# Patient Record
Sex: Male | Born: 1970 | Race: Black or African American | Hispanic: No | Marital: Married | Smoking: Never smoker
Health system: Southern US, Community
[De-identification: ages and names within clinical notes are randomized; demographics above are authoritative.]

## PROBLEM LIST (undated history)

## (undated) DIAGNOSIS — I10 Essential (primary) hypertension: Secondary | ICD-10-CM

## (undated) DIAGNOSIS — K31A0 Gastric intestinal metaplasia, unspecified: Secondary | ICD-10-CM

## (undated) DIAGNOSIS — E785 Hyperlipidemia, unspecified: Secondary | ICD-10-CM

## (undated) DIAGNOSIS — K2970 Gastritis, unspecified, without bleeding: Secondary | ICD-10-CM

## (undated) DIAGNOSIS — K449 Diaphragmatic hernia without obstruction or gangrene: Secondary | ICD-10-CM

## (undated) DIAGNOSIS — T7840XA Allergy, unspecified, initial encounter: Secondary | ICD-10-CM

## (undated) DIAGNOSIS — E538 Deficiency of other specified B group vitamins: Secondary | ICD-10-CM

## (undated) DIAGNOSIS — K219 Gastro-esophageal reflux disease without esophagitis: Secondary | ICD-10-CM

## (undated) DIAGNOSIS — E119 Type 2 diabetes mellitus without complications: Secondary | ICD-10-CM

## (undated) DIAGNOSIS — D126 Benign neoplasm of colon, unspecified: Secondary | ICD-10-CM

## (undated) DIAGNOSIS — K3189 Other diseases of stomach and duodenum: Secondary | ICD-10-CM

## (undated) DIAGNOSIS — R000 Tachycardia, unspecified: Secondary | ICD-10-CM

## (undated) HISTORY — DX: Type 2 diabetes mellitus without complications: E11.9

## (undated) HISTORY — DX: Deficiency of other specified B group vitamins: E53.8

## (undated) HISTORY — DX: Allergy, unspecified, initial encounter: T78.40XA

## (undated) HISTORY — DX: Gastritis, unspecified, without bleeding: K29.70

## (undated) HISTORY — PX: UPPER GASTROINTESTINAL ENDOSCOPY: SHX188

## (undated) HISTORY — DX: Essential (primary) hypertension: I10

## (undated) HISTORY — DX: Hyperlipidemia, unspecified: E78.5

## (undated) HISTORY — DX: Gastro-esophageal reflux disease without esophagitis: K21.9

## (undated) HISTORY — DX: Tachycardia, unspecified: R00.0

## (undated) HISTORY — DX: Diaphragmatic hernia without obstruction or gangrene: K44.9

## (undated) HISTORY — DX: Gastric intestinal metaplasia, unspecified: K31.A0

## (undated) HISTORY — PX: COLONOSCOPY: SHX174

## (undated) HISTORY — DX: Benign neoplasm of colon, unspecified: D12.6

## (undated) HISTORY — PX: LEG SURGERY: SHX1003

---
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
## 2011-05-19 ENCOUNTER — Other Ambulatory Visit: Payer: Self-pay | Admitting: Family Medicine

## 2011-05-19 DIAGNOSIS — R1011 Right upper quadrant pain: Secondary | ICD-10-CM

## 2011-05-22 ENCOUNTER — Ambulatory Visit
Admission: RE | Admit: 2011-05-22 | Discharge: 2011-05-22 | Disposition: A | Discharge status: Home / Self Care | Payer: BC Managed Care – PPO | Source: Ambulatory Visit | Attending: Family Medicine | Admitting: Family Medicine

## 2011-05-22 ENCOUNTER — Other Ambulatory Visit: Payer: Self-pay | Admitting: Family Medicine

## 2011-05-22 DIAGNOSIS — R1011 Right upper quadrant pain: Secondary | ICD-10-CM

## 2011-06-06 ENCOUNTER — Encounter: Payer: Self-pay | Admitting: Gastroenterology

## 2011-06-19 ENCOUNTER — Encounter: Payer: Self-pay | Admitting: *Deleted

## 2011-06-20 ENCOUNTER — Encounter: Payer: Self-pay | Admitting: Gastroenterology

## 2011-06-20 ENCOUNTER — Ambulatory Visit (INDEPENDENT_AMBULATORY_CARE_PROVIDER_SITE_OTHER): Payer: BC Managed Care – PPO | Admitting: Gastroenterology

## 2011-06-20 VITALS — BP 124/74 | Ht 67.0 in | Wt 190.6 lb

## 2011-06-20 DIAGNOSIS — K219 Gastro-esophageal reflux disease without esophagitis: Secondary | ICD-10-CM

## 2011-06-20 DIAGNOSIS — E739 Lactose intolerance, unspecified: Secondary | ICD-10-CM

## 2011-06-20 MED ORDER — OMEPRAZOLE 40 MG PO CPDR: 40.0000 mg | DELAYED_RELEASE_CAPSULE | Freq: Every day | ORAL | Status: DC

## 2011-06-20 NOTE — Progress Notes (Signed)
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 I saw him approximately 10 years ago for evaluation of acid reflux him up and at that time he had a negative endoscopy. He has done well and has been asymptomatic until 2 months ago when he developed recurrent reflux symptoms, gas, bloating, no real abnormal pain. Upper abdominal ultrasound exam was unremarkable. He specifically denies dysphagia, nausea vomiting, melena, hematochezia, or other upper GI or hepatobiliary complaints. His appetite is good he has gained approximately 25 pounds in weight over the last few years. He does have some stress at work which may be contributing to his problems. He does not use alcohol, cigarettes, or NSAIDs. Family history is noncontributory. It is of note that he does use a large amount of nonabsorbable carbohydrates with chewing gum. He denies lactose intolerance, but does not use much of this type of food.  I have reviewed this patient's present history, medical and surgical past history, allergies and medications.     ROS: The remainder of the 10 point ROS is negative      Physical Exam: General well developed well nourished patient in no acute distress, appearinghis stated age Eyes PERRLA, no icterus, fundoscopic exam per opthamologist Skin no lesions noted Neck supple, no adenopathy, no thyroid enlargement, no tenderness Chest clear to percussion and auscultation Heart no significant murmurs, gallops or rubs noted Abdomen no hepatosplenomegaly masses or tenderness, BS normal. Extremities no acute joint lesions, edema, phlebitis or evidence of cellulitis. Neurologic patient oriented x 3, cranial nerves intact, no focal neurologic deficits noted. Psychological mental status normal and normal affect.  Assessment and plan: GERD with associated probable mild maldigestion of nonabsorbable carbohydrates. We have gone over a standard reflux regime, started  Dexilant 60 mg a day, avoidance of sorbitol and fructose when necessary Lactaid use. Patient reports a recent laboratory data was unremarkable. I will see him back in one month's time for followup. I do not think endoscopy is indicated at this time unless he continues to have difficulties.  Encounter Diagnoses  Name Primary?  . GERD (gastroesophageal reflux disease) Yes  . Disaccharide malabsorption

## 2011-06-20 NOTE — Patient Instructions (Addendum)
We have sent the following medications to your pharmacy for you to pick up at your convenience: Omeprazole 40 mg daily. Please pick up lactaid over the counter and take as directed. Please follow the artificial sweeteners sheet we have given you. Please follow up in 1 month with Dr Jarold Motto  Diet for GERD  Nutrition therapy can help ease the discomfort of gastroesophageal reflux disease (GERD) and peptic ulcer disease (PUD).  HOME CARE INSTRUCTIONS   Eat your meals slowly, in a relaxed setting.   Eat 5 to 6 small meals per day.   If a food causes distress, stop eating it for a period of time.  FOODS TO AVOID  Coffee, regular or decaffeinated.   Cola beverages, regular or low calorie.   Tea, regular or decaffeinated.   Pepper.   Cocoa.   High fat foods, including meats.   Butter, margarine, hydrogenated oil (trans fats).   Peppermint or spearmint (if you have GERD).   Fruits and vegetables if not tolerated.   Alcohol.   Nicotine (smoking or chewing). This is one of the most potent stimulants to acid production in the gastrointestinal tract.   Any food that seems to aggravate your condition.  If you have questions regarding your diet, ask your caregiver or a registered dietitian. TIPS  Lying flat may make symptoms worse. Keep the head of your bed raised 6 to 9 inches (15 to 23 cm) by using a foam wedge or blocks under the legs of the bed.   Do not lay down until 3 hours after eating a meal.   Daily physical activity may help reduce symptoms.  MAKE SURE YOU:   Understand these instructions.   Will watch your condition.   Will get help right away if you are not doing well or get worse.  Document Released: 06/26/2005 Document Revised: 03/08/2011 Document Reviewed: 11/09/2008 [facility] Patient Information 2012 [place], [place].

## 2011-06-22 ENCOUNTER — Other Ambulatory Visit: Payer: Self-pay | Admitting: *Deleted

## 2011-06-22 MED ORDER — FIRST-DUKES MOUTHWASH MT SUSP: OROMUCOSAL | Status: DC

## 2011-07-10 ENCOUNTER — Ambulatory Visit (INDEPENDENT_AMBULATORY_CARE_PROVIDER_SITE_OTHER): Payer: BC Managed Care – PPO

## 2011-07-10 DIAGNOSIS — M545 Low back pain: Secondary | ICD-10-CM

## 2011-07-10 DIAGNOSIS — R1011 Right upper quadrant pain: Secondary | ICD-10-CM

## 2011-07-10 DIAGNOSIS — R51 Headache: Secondary | ICD-10-CM

## 2011-08-20 ENCOUNTER — Ambulatory Visit (INDEPENDENT_AMBULATORY_CARE_PROVIDER_SITE_OTHER): Payer: BC Managed Care – PPO | Admitting: Emergency Medicine

## 2011-08-20 VITALS — BP 136/86 | HR 91 | Temp 98.8°F | Resp 16 | Ht 67.0 in | Wt 186.0 lb

## 2011-08-20 DIAGNOSIS — J029 Acute pharyngitis, unspecified: Secondary | ICD-10-CM

## 2011-08-20 DIAGNOSIS — G43909 Migraine, unspecified, not intractable, without status migrainosus: Secondary | ICD-10-CM | POA: Insufficient documentation

## 2011-08-20 MED ORDER — MAGIC MOUTHWASH W/LIDOCAINE: 5.0000 mL | Freq: Four times a day (QID) | ORAL | Status: DC

## 2011-08-20 NOTE — Patient Instructions (Signed)

## 2011-08-20 NOTE — Progress Notes (Signed)
  Subjective:    Patient ID: Gregory Collins, male    DOB: 26-Dec-1970, 41 y.o.   MRN: [id number]  HPI the patient enters with a chief complaint of sore throat. For the last 2 days been bother with a severe sore throat not associated with fevers chills or adenopathy. He's had very minimal nasal congestion and has also had a cough.    Review of Systems patient is under treatment for reflux. 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     Objective:   Physical Exam physical exam reveals the TMs to be normal. The nose is normal. His throat is slightly injected. There is no adenopathy noted. Lungs are clear to auscultation and percussion.        Assessment & Plan:  Assessment pharyngitis. We'll check a strep test be sure this is not strep pharyngitis.

## 2011-08-21 ENCOUNTER — Encounter: Payer: Self-pay | Admitting: Gastroenterology

## 2011-08-21 ENCOUNTER — Encounter: Payer: Self-pay | Admitting: Emergency Medicine

## 2011-09-30 ENCOUNTER — Other Ambulatory Visit: Payer: Self-pay | Admitting: *Deleted

## 2011-09-30 MED ORDER — ALPRAZOLAM 1 MG PO TABS: 1.0000 mg | ORAL_TABLET | Freq: Four times a day (QID) | ORAL | Status: AC | PRN

## 2011-10-17 ENCOUNTER — Other Ambulatory Visit: Payer: Self-pay | Admitting: Gastroenterology

## 2011-12-27 ENCOUNTER — Ambulatory Visit (INDEPENDENT_AMBULATORY_CARE_PROVIDER_SITE_OTHER): Payer: BC Managed Care – PPO | Admitting: Family Medicine

## 2011-12-27 VITALS — BP 122/82 | HR 89 | Temp 98.6°F | Resp 18 | Ht 66.5 in | Wt 185.0 lb

## 2011-12-27 DIAGNOSIS — R197 Diarrhea, unspecified: Secondary | ICD-10-CM

## 2011-12-27 DIAGNOSIS — A088 Other specified intestinal infections: Secondary | ICD-10-CM

## 2011-12-27 DIAGNOSIS — A084 Viral intestinal infection, unspecified: Secondary | ICD-10-CM

## 2011-12-27 LAB — POCT CBC
Granulocyte percent: 59.7 %G (ref 37–80)
HCT, POC: 48.9 % (ref 43.5–53.7)
Lymph, poc: 2.3 (ref 0.6–3.4)
MCHC: 33.1 g/dL (ref 31.8–35.4)
MCV: 90.6 fL (ref 80–97)
MID (cbc): 0.6 (ref 0–0.9)
POC LYMPH PERCENT: 31.4 %L (ref 10–50)
Platelet Count, POC: 325 10*3/uL (ref 142–424)
RDW, POC: 14.8 %

## 2011-12-27 MED ORDER — DICYCLOMINE HCL 10 MG PO CAPS: ORAL_CAPSULE | ORAL | Status: DC

## 2011-12-27 NOTE — Progress Notes (Signed)
Results for orders placed in visit on 12/27/11  POCT CBC      Component Value Range   WBC 7.2  4.6 - 10.2 K/uL   Lymph, poc 2.3  0.6 - 3.4   POC LYMPH PERCENT 31.4  10 - 50 %L   MID (cbc) 0.6  0 - 0.9   POC MID % 8.9  0 - 12 %M   POC Granulocyte 4.3  2 - 6.9   Granulocyte percent 59.7  37 - 80 %G   RBC 5.40  4.69 - 6.13 M/uL   Hemoglobin 16.2  14.1 - 18.1 g/dL   HCT, POC 16.1  09.6 - 53.7 %   MCV 90.6  80 - 97 fL   MCH, POC 30.3  27 - 31.2 pg   MCHC 33.1  31.8 - 35.4 g/dL   RDW, POC 04.5     Platelet Count, POC 325  142 - 424 K/uL   MPV 9.5  0 - 99.8 fL

## 2011-12-27 NOTE — Patient Instructions (Signed)
Viral Gastroenteritis Viral gastroenteritis is also known as stomach flu. This condition affects the stomach and intestinal tract. It can cause sudden diarrhea and vomiting. The illness typically lasts 3 to 8 days. Most people develop an immune response that eventually gets rid of the virus. While this natural response develops, the virus can make you quite ill. CAUSES  Many different viruses can cause gastroenteritis, such as rotavirus or noroviruses. You can catch one of these viruses by consuming contaminated food or water. You may also catch a virus by sharing utensils or other personal items with an infected person or by touching a contaminated surface. SYMPTOMS  The most common symptoms are diarrhea and vomiting. These problems can cause a severe loss of body fluids (dehydration) and a body salt (electrolyte) imbalance. Other symptoms may include:  Fever.   Headache.   Fatigue.   Abdominal pain.  DIAGNOSIS  Your caregiver can usually diagnose viral gastroenteritis based on your symptoms and a physical exam. A stool sample may also be taken to test for the presence of viruses or other infections. TREATMENT  This illness typically goes away on its own. Treatments are aimed at rehydration. The most serious cases of viral gastroenteritis involve vomiting so severely that you are not able to keep fluids down. In these cases, fluids must be given through an intravenous line (IV). HOME CARE INSTRUCTIONS   Drink enough fluids to keep your urine clear or pale yellow. Drink small amounts of fluids frequently and increase the amounts as tolerated.   Ask your caregiver for specific rehydration instructions.   Avoid:   Foods high in sugar.   Alcohol.   Carbonated drinks.   Tobacco.   Juice.   Caffeine drinks.   Extremely hot or cold fluids.   Fatty, greasy foods.   Too much intake of anything at one time.   Dairy products until 24 to 48 hours after diarrhea stops.   You may  consume probiotics. Probiotics are active cultures of beneficial bacteria. They may lessen the amount and number of diarrheal stools in adults. Probiotics can be found in yogurt with active cultures and in supplements.   Wash your hands well to avoid spreading the virus.   Only take over-the-counter or prescription medicines for pain, discomfort, or fever as directed by your caregiver. Do not give aspirin to children. Antidiarrheal medicines are not recommended.   Ask your caregiver if you should continue to take your regular prescribed and over-the-counter medicines.   Keep all follow-up appointments as directed by your caregiver.  SEEK IMMEDIATE MEDICAL CARE IF:   You are unable to keep fluids down.   You do not urinate at least once every 6 to 8 hours.   You develop shortness of breath.   You notice blood in your stool or vomit. This may look like coffee grounds.   You have abdominal pain that increases or is concentrated in one small area (localized).   You have persistent vomiting or diarrhea.   You have a fever.   The patient is a child younger than 3 months, and he or she has a fever.   The patient is a child older than 3 months, and he or she has a fever and persistent symptoms.   The patient is a child older than 3 months, and he or she has a fever and symptoms suddenly get worse.   The patient is a baby, and he or she has no tears when crying.  MAKE SURE YOU:     Understand these instructions.   Will watch your condition.   Will get help right away if you are not doing well or get worse.  Document Released: 06/26/2005 Document Revised: 06/15/2011 Document Reviewed: 04/12/2011 [employer] Patient Information 2012 [employer]. 

## 2012-04-27 ENCOUNTER — Ambulatory Visit (INDEPENDENT_AMBULATORY_CARE_PROVIDER_SITE_OTHER): Payer: BC Managed Care – PPO | Admitting: Family Medicine

## 2012-04-27 VITALS — BP 112/84 | HR 100 | Temp 97.5°F | Resp 16 | Ht 68.0 in | Wt 193.0 lb

## 2012-04-27 DIAGNOSIS — J029 Acute pharyngitis, unspecified: Secondary | ICD-10-CM

## 2012-04-27 MED ORDER — AMOXICILLIN 875 MG PO TABS: 875.0000 mg | ORAL_TABLET | Freq: Two times a day (BID) | ORAL | Status: DC

## 2012-04-27 NOTE — Patient Instructions (Signed)

## 2012-04-27 NOTE — Progress Notes (Signed)
@[email or web address] @   Patient ID: ANGLE KAREL MRN: [id number], DOB: 1971/02/02, 41 y.o. Date of Encounter: 04/27/2012, 2:26 PM  Primary Physician: Tonye Pearson, MD  Chief Complaint:  Chief Complaint  Patient presents with  . Sore Throat    * 2 days  . Cough    HPI: 41 y.o. year old male presents with 3 day history of sore throat. Subjective fever and chills. No cough, congestion, rhinorrhea, sinus pressure, otalgia, or headache. Normal hearing. No GI complaints. Able to swallow saliva, but hurts to do so. Decreased appetite secondary to sore throat.   Past Medical History  Diagnosis Date  . Asthma   . GERD (gastroesophageal reflux disease)      Home Meds: Prior to Admission medications   Medication Sig Start Date End Date Taking? Authorizing Provider  omeprazole (PRILOSEC) 40 MG capsule Take 1 capsule (40 mg total) by mouth daily. 06/20/11 06/19/12 Yes Mardella Layman, MD  Alum & Mag Hydroxide-Simeth (MAGIC MOUTHWASH W/LIDOCAINE) SOLN Take 5 mLs by mouth 4 (four) times daily. 08/20/11   Collene Gobble, MD  dicyclomine (BENTYL) 10 MG capsule ONe or Two pills 4 times daily for diarrhea and cramping 12/27/11   Peyton Najjar, MD  Diphenhyd-Hydrocort-Nystatin (FIRST-DUKES MOUTHWASH) SUSP Use prn 06/22/11   Mardella Layman, MD  omeprazole (PRILOSEC) 10 MG capsule Take 10 mg by mouth as needed.    Historical Provider, MD    Allergies: No Known Allergies  History   Social History  . Marital Status: Married    Spouse Name: N/A    Number of Children: N/A  . Years of Education: N/A   Occupational History  . Not on file.   Social History Main Topics  . Smoking status: Never Smoker   . Smokeless tobacco: Not on file  . Alcohol Use: No  . Drug Use: No  . Sexually Active: Not on file   Other Topics Concern  . Not on file   Social History Narrative   ** Merged History Encounter **      Review of Systems: Constitutional: negative for chills, fever, night sweats or  weight changes HEENT: see above Cardiovascular: negative for chest pain or palpitations Respiratory: negative for hemoptysis, wheezing, or shortness of breath Abdominal: negative for abdominal pain, nausea, vomiting or diarrhea Dermatological: negative for rash Neurologic: negative for headache   Physical Exam: Blood pressure 112/84, pulse 100, temperature 97.5 F (36.4 C), temperature source Oral, resp. rate 16, height 5\' 8"  (1.727 m), weight 193 lb (87.544 kg), SpO2 97.00%., Body mass index is 29.35 kg/(m^2). General: Well developed, well nourished, in no acute distress. Head: Normocephalic, atraumatic, eyes without discharge, sclera non-icteric, nares are patent. Bilateral auditory canals clear, TM's are without perforation, pearly grey with reflective cone of light bilaterally. No sinus TTP. Oral cavity moist, dentition normal. Posterior pharynx with post nasal drip and mild erythema. No peritonsillar abscess or tonsillar exudate. Neck: Supple. No thyromegaly. Full ROM. No lymphadenopathy. Lungs: Clear bilaterally to auscultation without wheezes, rales, or rhonchi. Breathing is unlabored. Heart: RRR with S1 S2. No murmurs, rubs, or gallops appreciated. Abdomen: Soft, non-tender, non-distended with normoactive bowel sounds. No hepatomegaly. No rebound/guarding. No obvious abdominal masses. Msk:  Strength and tone normal for age. Extremities: No clubbing or cyanosis. No edema. Neuro: Alert and oriented X 3. Moves all extremities spontaneously. CNII-XII grossly in tact. Psych:  Responds to questions appropriately with a normal affect.   Labs:   ASSESSMENT AND PLAN:  41 y.o.  year old male with  - -Tylenol/Motrin prn -Rest/fluids -RTC precautions -RTC 3-5 days if no improvement  Signed, Elvina Sidle, MD 04/27/2012 2:26 PM

## 2012-04-29 LAB — CULTURE, GROUP A STREP: Organism ID, Bacteria: NORMAL

## 2012-06-24 ENCOUNTER — Ambulatory Visit (INDEPENDENT_AMBULATORY_CARE_PROVIDER_SITE_OTHER): Payer: BC Managed Care – PPO | Admitting: Internal Medicine

## 2012-06-24 VITALS — BP 134/86 | HR 91 | Temp 97.0°F | Resp 18 | Ht 67.0 in | Wt 198.2 lb

## 2012-06-24 DIAGNOSIS — M25539 Pain in unspecified wrist: Secondary | ICD-10-CM

## 2012-06-24 MED ORDER — MELOXICAM 15 MG PO TABS: 15.0000 mg | ORAL_TABLET | Freq: Every day | ORAL | Status: DC

## 2012-06-25 NOTE — Progress Notes (Signed)
  Subjective:    Patient ID: Gregory Collins, male    DOB: 1971-06-11, 41 y.o.   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 He has had no numbness or tingling in the hand, and no weakness There is no prior wrist or hand injury    Review of Systems     Objective:   Physical Exam Vital signs stable except overweight The left wrist is nonswollen There is tenderness to palpation over the volar aspect of the wrist with a positive Tinel's sign Wrist has a full range of motion without pain No sensory losses in the hand Cannot reproduce paresthesias with dorsiflexion of wrist DTreflexes are preserved No vascular losses        Assessment & Plan: Problem #1 Problem #1 wrist pain problem #1 wrist pain-   P#1 wrist pain-suspect early carpal tunnel syndrome  Wrist splint for work and sleep for 2 weeks Handout for exercises Meloxicam daily 15 mg Recheck 2-4 weeks

## 2012-08-11 ENCOUNTER — Encounter: Payer: Self-pay | Admitting: Family Medicine

## 2012-08-11 ENCOUNTER — Ambulatory Visit (INDEPENDENT_AMBULATORY_CARE_PROVIDER_SITE_OTHER): Payer: BC Managed Care – PPO | Admitting: Family Medicine

## 2012-08-11 VITALS — BP 142/84 | HR 93 | Temp 97.8°F | Resp 16 | Ht 68.0 in | Wt 186.6 lb

## 2012-08-11 DIAGNOSIS — T24109A Burn of first degree of unspecified site of unspecified lower limb, except ankle and foot, initial encounter: Secondary | ICD-10-CM

## 2012-08-11 DIAGNOSIS — M79605 Pain in left leg: Secondary | ICD-10-CM

## 2012-08-11 DIAGNOSIS — M79609 Pain in unspecified limb: Secondary | ICD-10-CM

## 2012-08-11 DIAGNOSIS — T22119A Burn of first degree of unspecified forearm, initial encounter: Secondary | ICD-10-CM

## 2012-08-11 MED ORDER — TRIAMCINOLONE ACETONIDE 0.1 % EX CREA: TOPICAL_CREAM | Freq: Three times a day (TID) | CUTANEOUS | Status: DC

## 2012-08-11 MED ORDER — TRAMADOL HCL 50 MG PO TABS: 50.0000 mg | ORAL_TABLET | Freq: Four times a day (QID) | ORAL | Status: DC | PRN
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
## 2012-08-14 ENCOUNTER — Encounter: Payer: BC Managed Care – PPO | Admitting: Internal Medicine

## 2012-09-04 ENCOUNTER — Encounter: Payer: BC Managed Care – PPO | Admitting: Internal Medicine

## 2012-11-07 ENCOUNTER — Ambulatory Visit (INDEPENDENT_AMBULATORY_CARE_PROVIDER_SITE_OTHER): Payer: BC Managed Care – PPO | Admitting: Physician Assistant

## 2012-11-07 VITALS — BP 132/80 | HR 91 | Temp 98.0°F | Resp 16 | Ht 68.0 in | Wt 184.0 lb

## 2012-11-07 DIAGNOSIS — J309 Allergic rhinitis, unspecified: Secondary | ICD-10-CM

## 2012-11-07 DIAGNOSIS — J029 Acute pharyngitis, unspecified: Secondary | ICD-10-CM

## 2012-11-07 DIAGNOSIS — J302 Other seasonal allergic rhinitis: Secondary | ICD-10-CM

## 2012-11-07 LAB — POCT RAPID STREP A (OFFICE): Rapid Strep A Screen: NEGATIVE

## 2012-11-07 NOTE — Progress Notes (Signed)
   [address], [place] [address]   Phone [phone]  Subjective:    Patient ID: Gregory Collins, male    DOB: 01/10/1971, 42 y.o.   MRN: [id number]  HPI Pt presents to clinic with sore throat.  He has seasonal allergies but typically does not get sore throat.  He is concerned mainly because his children bother tested + for strep at the peds office last week.  He has had no other symptoms that are not related to his allergies.  No OTC meds. Both children are being treated for strep throat.  Review of Systems  Constitutional: Negative for fever and chills.  HENT: Positive for congestion, sore throat, rhinorrhea and postnasal drip.   Respiratory: Positive for cough.   Allergic/Immunologic: Positive for environmental allergies.       Objective:   Physical Exam  Vitals reviewed. Constitutional: He is oriented to person, place, and time. He appears well-developed and well-nourished.  HENT:  Head: Normocephalic and atraumatic.  Right Ear: Hearing, tympanic membrane, external ear and ear canal normal.  Left Ear: Hearing, tympanic membrane, external ear and ear canal normal.  Nose: Mucosal edema (pale) present.  Mouth/Throat: Uvula is midline. Posterior oropharyngeal erythema present. No oropharyngeal exudate or posterior oropharyngeal edema.  Cardiovascular: Normal rate, regular rhythm and normal heart sounds.   No murmur heard. Pulmonary/Chest: Effort normal and breath sounds normal.  Neurological: He is alert and oriented to person, place, and time.  Skin: Skin is warm and dry.  Psychiatric: He has a normal mood and affect. His behavior is normal. Judgment and thought content normal.   Results for orders placed in visit on 11/07/12  POCT RAPID STREP A (OFFICE)      Result Value Range   Rapid Strep A Screen Negative  Negative       Assessment & Plan:  Sore throat - Plan: POCT rapid strep A, Culture, Group A Strep - I will send a culture due to children having it.  He will  use OTC NSAIDs to help with pain.  Seasonal allergies - continue current allergy treatment.  Benny Lennert PA-C 11/07/2012 7:36 PM

## 2013-01-05 ENCOUNTER — Ambulatory Visit (INDEPENDENT_AMBULATORY_CARE_PROVIDER_SITE_OTHER): Payer: BC Managed Care – PPO | Admitting: Family Medicine

## 2013-01-05 VITALS — BP 115/77 | HR 87 | Temp 98.0°F | Resp 17 | Ht 68.5 in | Wt 188.0 lb

## 2013-01-05 DIAGNOSIS — J020 Streptococcal pharyngitis: Secondary | ICD-10-CM

## 2013-01-05 DIAGNOSIS — J029 Acute pharyngitis, unspecified: Secondary | ICD-10-CM

## 2013-01-05 MED ORDER — AMOXICILLIN 875 MG PO TABS: 875.0000 mg | ORAL_TABLET | Freq: Two times a day (BID) | ORAL | Status: DC

## 2013-01-05 NOTE — Patient Instructions (Addendum)
Strep Throat  Strep throat is an infection of the throat caused by a bacteria named Streptococcus pyogenes. Your caregiver may call the infection streptococcal "tonsillitis" or "pharyngitis" depending on whether there are signs of inflammation in the tonsils or back of the throat. Strep throat is most common in children aged 41 15 years during the cold months of the year, but it can occur in people of any age during any season. This infection is spread from person to person (contagious) through coughing, sneezing, or other close contact.  SYMPTOMS   · Fever or chills.  · Painful, swollen, red tonsils or throat.  · Pain or difficulty when swallowing.  · White or yellow spots on the tonsils or throat.  · Swollen, tender lymph nodes or "glands" of the neck or under the jaw.  · Red rash all over the body (rare).  DIAGNOSIS   Many different infections can cause the same symptoms. A test must be done to confirm the diagnosis so the right treatment can be given. A "rapid strep test" can help your caregiver make the diagnosis in a few minutes. If this test is not available, a light swab of the infected area can be used for a throat culture test. If a throat culture test is done, results are usually available in a day or two.  TREATMENT   Strep throat is treated with antibiotic medicine.  HOME CARE INSTRUCTIONS   · Gargle with 1 tsp of salt in 1 cup of warm water, 3 4 times per day or as needed for comfort.  · Family members who also have a sore throat or fever should be tested for strep throat and treated with antibiotics if they have the strep infection.  · Make sure everyone in your household washes their hands well.  · Do not share food, drinking cups, or personal items that could cause the infection to spread to others.  · You may need to eat a soft food diet until your sore throat gets better.  · Drink enough water and fluids to keep your urine clear or pale yellow. This will help prevent dehydration.  · Get plenty of  rest.  · Stay home from school, daycare, or work until you have been on antibiotics for 24 hours.  · Only take over-the-counter or prescription medicines for pain, discomfort, or fever as directed by your caregiver.  · If antibiotics are prescribed, take them as directed. Finish them even if you start to feel better.  SEEK MEDICAL CARE IF:   · The glands in your neck continue to enlarge.  · You develop a rash, cough, or earache.  · You cough up green, yellow-brown, or bloody sputum.  · You have pain or discomfort not controlled by medicines.  · Your problems seem to be getting worse rather than better.  SEEK IMMEDIATE MEDICAL CARE IF:   · You develop any new symptoms such as vomiting, severe headache, stiff or painful neck, chest pain, shortness of breath, or trouble swallowing.  · You develop severe throat pain, drooling, or changes in your voice.  · You develop swelling of the neck, or the skin on the neck becomes red and tender.  · You have a fever.  · You develop signs of dehydration, such as fatigue, dry mouth, and decreased urination.  · You become increasingly sleepy, or you cannot wake up completely.  Document Released: 06/23/2000 Document Revised: 06/12/2012 Document Reviewed: 08/25/2010  Exit[employer]® Patient Information ©2014 [employer].

## 2013-01-05 NOTE — Progress Notes (Signed)
Subjective: 42 year old man who is here with a sore throat. He was on vacation this past week, and Wednesday developed a sore throat. Thursday had some fever. He has had a minimal cough at times. Not much in the way of body aches. Generally he is healthy. Does some working out.  He is a Radio producer at [employer].  Objective: Pleasant gentleman in no major distress this time. TMs normal. Throat mildly erythematous and edematous but no exudate. Neck supple without significant nodes. Chest is clear to auscultation. Heart regular without murmurs.  Assessment: Pharyngitis  Plan: Check strep screen  Results for orders placed in visit on 01/05/13  POCT RAPID STREP A (OFFICE)      Result Value Range   Rapid Strep A Screen Positive (*) Negative

## 2013-05-29 ENCOUNTER — Ambulatory Visit (INDEPENDENT_AMBULATORY_CARE_PROVIDER_SITE_OTHER): Payer: BC Managed Care – PPO | Admitting: Physician Assistant

## 2013-05-29 VITALS — BP 146/92 | HR 99 | Temp 97.8°F | Resp 18 | Ht 68.5 in | Wt 191.6 lb

## 2013-05-29 DIAGNOSIS — J029 Acute pharyngitis, unspecified: Secondary | ICD-10-CM

## 2013-05-29 LAB — POCT RAPID STREP A (OFFICE): Rapid Strep A Screen: NEGATIVE

## 2013-05-29 MED ORDER — FIRST-DUKES MOUTHWASH MT SUSP: 10.0000 mL | OROMUCOSAL | Status: DC | PRN

## 2013-05-29 NOTE — Progress Notes (Signed)
  [address]  [place], [place]  [phone]  [email or web address]  Subjective:    Patient ID: Gregory Collins, male    DOB: 1970-08-10, 42 y.o.   MRN: [id number]  HPI   Mr. Bilotti is a very pleasant 42 yr old male here with concern for illness.  Reports 3 days of sore throat.  Pain is bilateral.  No drooling or voice change.  No URI symptoms.  No fever.  No GI symptoms.  No known sick contacts.  He does have children, but they have been well.  Does report a history of allergies for which he periodically takes claritin d.  Current symptoms do not seem attributable to allergies.  Has been doing salt water gargles.  Has not taken pain medicine for throat.     Review of Systems  Constitutional: Negative for fever and chills.  HENT: Positive for sore throat. Negative for congestion, ear pain, postnasal drip, rhinorrhea, sinus pressure, sneezing, trouble swallowing and voice change.   Respiratory: Negative for cough, shortness of breath and wheezing.   Gastrointestinal: Negative for nausea, vomiting and diarrhea.  Musculoskeletal: Negative for arthralgias and myalgias.  Skin: Negative for rash.  Neurological: Negative for headaches.       Objective:   Physical Exam  Vitals reviewed. Constitutional: He is oriented to person, place, and time. He appears well-developed and well-nourished. No distress.  HENT:  Head: Normocephalic and atraumatic.  Right Ear: Tympanic membrane and ear canal normal.  Left Ear: Tympanic membrane and ear canal normal.  Mouth/Throat: Uvula is midline and mucous membranes are normal. Oropharyngeal exudate (small amount on right tonsil), posterior oropharyngeal edema and posterior oropharyngeal erythema present. No tonsillar abscesses.  Eyes: Conjunctivae are normal. No scleral icterus.  Neck: Neck supple.  Cardiovascular: Normal rate, regular rhythm and normal heart sounds.   Pulmonary/Chest: Effort normal and breath sounds normal. He has no wheezes. He has no  rales.  Lymphadenopathy:    He has no cervical adenopathy.  Neurological: He is alert and oriented to person, place, and time.  Skin: Skin is warm and dry.  Psychiatric: He has a normal mood and affect. His behavior is normal.     Results for orders placed in visit on 05/29/13  POCT RAPID STREP A (OFFICE)      Result Value Range   Rapid Strep A Screen Negative  Negative       Assessment & Plan:  Acute pharyngitis - Plan: POCT rapid strep A, Culture, Group A Strep, Diphenhyd-Hydrocort-Nystatin (FIRST-DUKES MOUTHWASH) SUSP   Mr. Andringa is a very pleasant 42 yr old male here with acute pharyngitis.  Rapid strep is negative.  Will send cx to confirm.  In the absence of fever, adenopathy will hold off on abx.  Symptomatic treatment with ibuprofen, magic mouthwash.  Push fluids, rest.  Will follow up on cx results and treat if necessary.  Pt to call if worsening in the mean time.  Meds ordered this encounter  Medications  . Diphenhyd-Hydrocort-Nystatin (FIRST-DUKES MOUTHWASH) SUSP    Sig: Take 10 mLs by mouth every 2 (two) hours as needed. With 1:1 ratio viscous lidocaine    Dispense:  360 mL    Refill:  0    Order Specific Question:  Supervising Provider    Answer:  Nilda Simmer M [[id number]]    Loleta Dicker MHS, PA-C [facility] 11/20/20148:43 PM

## 2013-05-29 NOTE — Patient Instructions (Signed)
Your strep test is negative today. I am sending a culture to confirm this.  In the mean time, continue salt water gargles.  Take 600mg  ibuprofen every 8 hours (with food) to help with throat pain.  You can also use the Magic Mouthwash as frequently as every 2 hours if needed for throat pain.  Plenty of fluids and rest.  Please let me know if any symptoms are worsening or not improving.  I will be in touch about your culture results and will send antibiotics if necessary   Viral Pharyngitis Viral pharyngitis is a viral infection that produces redness, pain, and swelling (inflammation) of the throat. It can spread from person to person (contagious). CAUSES Viral pharyngitis is caused by inhaling a large amount of certain germs called viruses. Many different viruses cause viral pharyngitis. SYMPTOMS Symptoms of viral pharyngitis include:  Sore throat.  Tiredness.  Stuffy nose.  Low-grade fever.  Congestion.  Cough. TREATMENT Treatment includes rest, drinking plenty of fluids, and the use of over-the-counter medication (approved by your caregiver). HOME CARE INSTRUCTIONS   Drink enough fluids to keep your urine clear or pale yellow.  Eat soft, cold foods such as ice cream, frozen ice pops, or gelatin dessert.  Gargle with warm salt water (1 tsp salt per 1 qt of water).  If over age 22, throat lozenges may be used safely.  Only take over-the-counter or prescription medicines for pain, discomfort, or fever as directed by your caregiver. Do not take aspirin. To help prevent spreading viral pharyngitis to others, avoid:  Mouth-to-mouth contact with others.  Sharing utensils for eating and drinking.  Coughing around others. SEEK MEDICAL CARE IF:   You are better in a few days, then become worse.  You have a fever or pain not helped by pain medicines.  There are any other changes that concern you. Document Released: 04/05/2005 Document Revised: 09/18/2011 Document Reviewed:  09/01/2010 [facility] Patient Information 2014 [place], [place].

## 2013-05-31 LAB — CULTURE, GROUP A STREP: Organism ID, Bacteria: NORMAL

## 2013-06-13 ENCOUNTER — Ambulatory Visit (INDEPENDENT_AMBULATORY_CARE_PROVIDER_SITE_OTHER): Payer: BC Managed Care – PPO | Admitting: Family Medicine

## 2013-06-13 VITALS — BP 140/84 | HR 99 | Temp 98.4°F | Resp 16 | Ht 68.0 in | Wt 185.0 lb

## 2013-06-13 DIAGNOSIS — J358 Other chronic diseases of tonsils and adenoids: Secondary | ICD-10-CM

## 2013-06-13 DIAGNOSIS — K1379 Other lesions of oral mucosa: Secondary | ICD-10-CM

## 2013-06-13 DIAGNOSIS — K1370 Unspecified lesions of oral mucosa: Secondary | ICD-10-CM

## 2013-06-13 DIAGNOSIS — K140 Glossitis: Secondary | ICD-10-CM

## 2013-06-13 MED ORDER — AMOXICILLIN 875 MG PO TABS: 875.0000 mg | ORAL_TABLET | Freq: Two times a day (BID) | ORAL | Status: DC

## 2013-06-13 MED ORDER — TRIAMCINOLONE ACETONIDE 0.1 % MT PSTE: 1.0000 "application " | PASTE | Freq: Two times a day (BID) | OROMUCOSAL | Status: DC

## 2013-06-13 NOTE — Progress Notes (Signed)
Subjective: 42 year old patient who was here last week with a sore throat. He was treated symptomatically because strep is negative. He is continued with some discomfort there. However his primary reason for coming in today is a sore area on the lateral aspect of his tongue on the right.   Objective: He has tonsillar debris, primarily a large clot in the right tonsil which I was able to squeeze out using the tongue blade. He has a tiny bit in his left tonsil, and after expressing 2 clumps of debris from the right tonsil there is still a tiny bit present. I discussed with him how to try and get rid of it. His tongue has a little area of erythema on the lateral aspect about 1.5 cm in diameter where he is tender. He also has some erythema and inflammation with glandular structure underneath the tongue floor of mouth on the right side.  Assessment: Tonsillar debris Sore throat Glossitis Oral pain  Plan: Kenalog in [place] on the sore areas Amoxicillin 875 twice daily for a week Return form to persist

## 2013-06-13 NOTE — Patient Instructions (Signed)
Take the antibiotic one twice daily for one week  Use the triamcinolone about 3 or 4 times daily for 5 days on the painful placed in the mouth. If symptoms persist over the next couple weeks return for recheck

## 2013-09-11 ENCOUNTER — Ambulatory Visit (INDEPENDENT_AMBULATORY_CARE_PROVIDER_SITE_OTHER): Payer: BC Managed Care – PPO | Admitting: Family Medicine

## 2013-09-11 ENCOUNTER — Encounter: Payer: Self-pay | Admitting: Family Medicine

## 2013-09-11 VITALS — BP 126/82 | HR 91 | Temp 98.4°F | Resp 17 | Ht 68.5 in | Wt 197.0 lb

## 2013-09-11 DIAGNOSIS — Z8042 Family history of malignant neoplasm of prostate: Secondary | ICD-10-CM

## 2013-09-11 DIAGNOSIS — L290 Pruritus ani: Secondary | ICD-10-CM

## 2013-09-11 DIAGNOSIS — K6289 Other specified diseases of anus and rectum: Secondary | ICD-10-CM

## 2013-09-11 DIAGNOSIS — R109 Unspecified abdominal pain: Secondary | ICD-10-CM

## 2013-09-11 DIAGNOSIS — Z125 Encounter for screening for malignant neoplasm of prostate: Secondary | ICD-10-CM

## 2013-09-11 LAB — IFOBT (OCCULT BLOOD): IFOBT: NEGATIVE

## 2013-09-11 LAB — PSA: PSA: 0.62 ng/mL (ref <=4.00)

## 2013-09-11 NOTE — Patient Instructions (Addendum)
Try to avoid over-wiping area and other information as in the handout. Recheck in the next 2 weeks if this soreness is not improving - sooner if worse.  Your diarrhea is likely due to a virus. Return to the clinic or go to the nearest emergency room if any of your symptoms worsen or new symptoms occur. You should receive a call or letter about your lab results within the next week to 10 days.     Diarrhea Diarrhea is frequent loose and watery bowel movements. It can cause you to feel weak and dehydrated. Dehydration can cause you to become tired and thirsty, have a dry mouth, and have decreased urination that often is dark yellow. Diarrhea is a sign of another problem, most often an infection that will not last long. In most cases, diarrhea typically lasts 2 3 days. However, it can last longer if it is a sign of something more serious. It is important to treat your diarrhea as directed by your caregive to lessen or prevent future episodes of diarrhea. CAUSES  Some common causes include:  Gastrointestinal infections caused by viruses, bacteria, or parasites.  Food poisoning or food allergies.  Certain medicines, such as antibiotics, chemotherapy, and laxatives.  Artificial sweeteners and fructose.  Digestive disorders. HOME CARE INSTRUCTIONS  Ensure adequate fluid intake (hydration): have 1 cup (8 oz) of fluid for each diarrhea episode. Avoid fluids that contain simple sugars or sports drinks, fruit juices, whole milk products, and sodas. Your urine should be clear or pale yellow if you are drinking enough fluids. Hydrate with an oral rehydration solution that you can purchase at pharmacies, retail stores, and online. You can prepare an oral rehydration solution at home by mixing the following ingredients together:    tsp table salt.   tsp baking soda.   tsp salt substitute containing potassium chloride.  1  tablespoons sugar.  1 L (34 oz) of water.  Certain foods and beverages may  increase the speed at which food moves through the gastrointestinal (GI) tract. These foods and beverages should be avoided and include:  Caffeinated and alcoholic beverages.  High-fiber foods, such as raw fruits and vegetables, nuts, seeds, and whole grain breads and cereals.  Foods and beverages sweetened with sugar alcohols, such as xylitol, sorbitol, and mannitol.  Some foods may be well tolerated and may help thicken stool including:  Starchy foods, such as rice, toast, pasta, low-sugar cereal, oatmeal, grits, baked potatoes, crackers, and bagels.  Bananas.  Applesauce.  Add probiotic-rich foods to help increase healthy bacteria in the GI tract, such as yogurt and fermented milk products.  Wash your hands well after each diarrhea episode.  Only take over-the-counter or prescription medicines as directed by your caregiver.  Take a warm bath to relieve any burning or pain from frequent diarrhea episodes. SEEK IMMEDIATE MEDICAL CARE IF:   You are unable to keep fluids down.  You have persistent vomiting.  You have blood in your stool, or your stools are black and tarry.  You do not urinate in 6 8 hours, or there is only a small amount of very dark urine.  You have abdominal pain that increases or localizes.  You have weakness, dizziness, confusion, or lightheadedness.  You have a severe headache.  Your diarrhea gets worse or does not get better.  You have a fever or persistent symptoms for more than 2 3 days.  You have a fever and your symptoms suddenly get worse. MAKE SURE YOU:   Understand  these instructions.  Will watch your condition.  Will get help right away if you are not doing well or get worse. Document Released: 06/16/2002 Document Revised: 06/12/2012 Document Reviewed: 03/03/2012 [facility] Patient Information 2014 [place], [place].

## 2013-09-11 NOTE — Progress Notes (Addendum)
Subjective:   This chart was scribed for Gregory Ray, MD by Gregory Collins, [facility] Scribe. This patient was seen in room 14 and the patient's care was started 10:19 AM.    Patient ID: Gregory Collins, male    DOB: Jul 20, 1970, 43 y.o.   MRN: [id number]  HPI  HPI Comments: Gregory Collins is a 43 y.o. male who presents to [facility] complaining of gradual onset, unchanged, moderate diffuse abdominal pain with associated diarrhea that initially started 3-4 days ago. Pt reports 2-3 loose and regular stools daily, but denies any blood in his stool. He also reports intermittent pain to his perianal area worsened by walking and certain movements onset 3 weeks. Pt states he drives frequently and feels his pain may be related to his discomfort. Pt states his father has a PMHx of PC, diagnosed at the age of 60, and feels he should be screened today. Last prostate screening about 6 years ago. He denies any blood with wiping. Denies any hemorrhoids that he is aware of. At this time he denies any fever, chills, hematuria,or  trouble urinating. Pts PMHx includes asthma and GERD. He has no other concerns this visit.   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 Authorizing Provider  omeprazole (PRILOSEC) 10 MG capsule Take 10 mg by mouth as needed.   Yes Historical Provider, MD   History   Social History   Marital Status: Married    Spouse Name: N/A    Number of Children: N/A   Years of Education: N/A    Occupational History   Not on file.   Social History Main Topics   Smoking status: Never Smoker    Smokeless tobacco: Never Used   Alcohol Use: Yes     Comment: social   Drug Use: No   Sexual Activity: Yes    Museum/gallery curator: None   Other Topics Concern   Not on file   Social History Narrative   ** Merged History Encounter **         Review of Systems  Constitutional: Negative for fever and chills.  HENT: Negative for congestion.   Eyes: Negative for redness.  Respiratory: Negative for cough.   Gastrointestinal: Positive for abdominal pain and diarrhea. Negative for nausea and vomiting.  Genitourinary: Negative for hematuria and difficulty urinating.       Perianal discomfort  Musculoskeletal: Negative for back pain.  Skin: Negative for rash.  Neurological: Negative for weakness.  Psychiatric/Behavioral: Negative for confusion.    Filed Vitals:   09/11/13 0942  BP: 126/82  Pulse: 91  Temp: 98.4 F (36.9 C)  TempSrc: Oral  Resp: 17  Height: 5' 8.5" (1.74 m)  Weight: 197 lb (89.359 kg)  SpO2: 98%    Objective:   Physical Exam  Nursing note and vitals reviewed. Constitutional: He is oriented to person, place, and time. He appears well-developed and well-nourished.  HENT:  Head: Normocephalic and atraumatic.  Eyes: EOM are  normal.  Neck: Normal range of motion.  Cardiovascular: Normal rate, regular rhythm and normal heart sounds.   Pulmonary/Chest: Effort normal and breath sounds normal.  Abdominal: Soft. Bowel sounds are normal. He exhibits no distension. There is no tenderness. There is no rebound and no guarding.  Genitourinary:  Small raw appearing area just inferior to anus without fissures or hemorrhoids Guarded and uncomfortable rectal exam with inability to palpate entire prostate but distal aspect without apparent nodules  Musculoskeletal: Normal range of motion. He exhibits no tenderness.  LS spine and sciatic notch nontender   Neurological: He is alert and oriented to person, place, and time.  Skin: Skin is warm and dry.  Psychiatric: He has a normal mood and affect. His behavior is normal.      Assessment & Plan:   Gregory Collins is a 43 y.o. male Perianal pain - Plan: PSA, IFOBT POC (occult bld, rslt in office)  Abdominal pain  Prostate cancer screening - Plan: PSA  Family history of prostate cancer  Pruritus ani  Suspected pruritus ani - h/o from UTD.recheck in next 2 weeks if not improving.   Abdomen nt, possible viral GE - sx care discussed, rtc precautions.   Guarded prostate exam,FH of prostate CA - check PSA and if elevated - repeat testing.no urinary sx's, doubt prostatitis.  rtc precautions.    No orders of the defined types were placed in this encounter.   Patient Instructions  Try to avoid over-wiping area and other information as in the handout. Recheck in the next 2 weeks if this soreness is not improving - sooner if worse.  Your diarrhea is likely due to a virus. Return to the clinic or go to the nearest emergency room if any of your symptoms worsen or new symptoms occur. You should receive a call or letter about your lab results within the next week to 10 days.     Diarrhea Diarrhea is frequent loose and watery bowel movements. It can cause you to feel weak and dehydrated. Dehydration can cause you to become tired and thirsty, have a dry mouth, and have decreased urination that often is dark yellow. Diarrhea is a sign of another problem, most often an infection that will not last long. In most cases, diarrhea typically lasts 2 3 days. However, it can last longer if it is a sign of something more serious. It is important to treat your diarrhea as directed by your caregive to lessen or prevent future episodes of diarrhea. CAUSES  Some common causes include:  Gastrointestinal infections caused by viruses, bacteria, or parasites.  Food poisoning or food allergies.  Certain  medicines, such as antibiotics, chemotherapy, and laxatives.  Artificial sweeteners and fructose.  Digestive disorders. HOME CARE INSTRUCTIONS  Ensure adequate fluid intake (hydration): have 1 cup (8 oz) of fluid for each diarrhea episode. Avoid fluids that contain simple sugars or sports drinks, fruit juices, whole milk products, and sodas. Your urine should be clear or pale yellow if you are drinking enough fluids. Hydrate with an oral rehydration solution that you can purchase at pharmacies, retail stores, and online. You can prepare an oral rehydration solution at home by mixing the following ingredients together:    tsp table salt.   tsp baking soda.   tsp salt substitute containing potassium chloride.  1  tablespoons sugar.  1 L (34 oz) of water.  Certain foods and beverages may increase the speed at which food moves through the gastrointestinal (GI) tract. These foods  and beverages should be avoided and include:  Caffeinated and alcoholic beverages.  High-fiber foods, such as raw fruits and vegetables, nuts, seeds, and whole grain breads and cereals.  Foods and beverages sweetened with sugar alcohols, such as xylitol, sorbitol, and mannitol.  Some foods may be well tolerated and may help thicken stool including:  Starchy foods, such as rice, toast, pasta, low-sugar cereal, oatmeal, grits, baked potatoes, crackers, and bagels.  Bananas.  Applesauce.  Add probiotic-rich foods to help increase healthy bacteria in the GI tract, such as yogurt and fermented milk products.  Wash your hands well after each diarrhea episode.  Only take over-the-counter or prescription medicines as directed by your caregiver.  Take a warm bath to relieve any burning or pain from frequent diarrhea episodes. SEEK IMMEDIATE MEDICAL CARE IF:   You are unable to keep fluids down.  You have persistent vomiting.  You have blood in your stool, or your stools are black and tarry.  You do not  urinate in 6 8 hours, or there is only a small amount of very dark urine.  You have abdominal pain that increases or localizes.  You have weakness, dizziness, confusion, or lightheadedness.  You have a severe headache.  Your diarrhea gets worse or does not get better.  You have a fever or persistent symptoms for more than 2 3 days.  You have a fever and your symptoms suddenly get worse. MAKE SURE YOU:   Understand these instructions.  Will watch your condition.  Will get help right away if you are not doing well or get worse. Document Released: 06/16/2002 Document Revised: 06/12/2012 Document Reviewed: 03/03/2012 [facility] Patient Information 2014 [place], [place].        I personally performed the services described in this documentation, which was scribed in my presence. The recorded information has been reviewed and is accurate.

## 2013-10-27 ENCOUNTER — Ambulatory Visit (INDEPENDENT_AMBULATORY_CARE_PROVIDER_SITE_OTHER): Payer: BC Managed Care – PPO | Admitting: Emergency Medicine

## 2013-10-27 VITALS — BP 122/74 | HR 95 | Temp 98.0°F | Resp 17 | Ht 69.5 in | Wt 197.0 lb

## 2013-10-27 DIAGNOSIS — J029 Acute pharyngitis, unspecified: Secondary | ICD-10-CM

## 2013-10-27 LAB — POCT RAPID STREP A (OFFICE): Rapid Strep A Screen: NEGATIVE

## 2013-10-27 MED ORDER — FIRST-DUKES MOUTHWASH MT SUSP: OROMUCOSAL | Status: DC

## 2013-10-27 NOTE — Progress Notes (Signed)
   Subjective:    Patient ID: Gregory Collins, male    DOB: 1971/03/25, 43 y.o.   MRN: [id number]  HPI  43 YO male patient comes in today with complaints of a sore throat. He has a history of allergies. He uses Claritin as needed. He noticed some red patches in his mouth. It started 2 days ago. He is coughing and feels some chest congestion.   He works at [employer] and is in public often.   Review of Systems     Objective:   Physical Exam there is 2+ redness of the posterior pharynx. There are 3 blisterlike areas on the soft palate. TMs are clear there is no adenopathy. Chest is clear to both auscultation and percussion.  Results for orders placed in visit on 10/27/13  POCT RAPID STREP A (OFFICE)      Result Value Ref Range   Rapid Strep A Screen Negative  Negative        Assessment & Plan:  Will treat symptomatically with Dukes mouthwash. Propulsid was done to

## 2013-10-27 NOTE — Patient Instructions (Signed)
Sore Throat A sore throat is pain, burning, irritation, or scratchiness of the throat. There is often pain or tenderness when swallowing or talking. A sore throat may be accompanied by other symptoms, such as coughing, sneezing, fever, and swollen neck glands. A sore throat is often the first sign of another sickness, such as a cold, flu, strep throat, or mononucleosis (commonly known as mono). Most sore throats go away without medical treatment. CAUSES  The most common causes of a sore throat include:  A viral infection, such as a cold, flu, or mono.  A bacterial infection, such as strep throat, tonsillitis, or whooping cough.  Seasonal allergies.  Dryness in the air.  Irritants, such as smoke or pollution.  Gastroesophageal reflux disease (GERD). HOME CARE INSTRUCTIONS   Only take over-the-counter medicines as directed by your caregiver.  Drink enough fluids to keep your urine clear or pale yellow.  Rest as needed.  Try using throat sprays, lozenges, or sucking on hard candy to ease any pain (if older than 4 years or as directed).  Sip warm liquids, such as broth, herbal tea, or warm water with honey to relieve pain temporarily. You may also eat or drink cold or frozen liquids such as frozen ice pops.  Gargle with salt water (mix 1 tsp salt with 8 oz of water).  Do not smoke and avoid secondhand smoke.  Put a cool-mist humidifier in your bedroom at night to moisten the air. You can also turn on a hot shower and sit in the bathroom with the door closed for 5 10 minutes. SEEK IMMEDIATE MEDICAL CARE IF:  You have difficulty breathing.  You are unable to swallow fluids, soft foods, or your saliva.  You have increased swelling in the throat.  Your sore throat does not get better in 7 days.  You have nausea and vomiting.  You have a fever or persistent symptoms for more than 2 3 days.  You have a fever and your symptoms suddenly get worse. MAKE SURE YOU:   Understand  these instructions.  Will watch your condition.  Will get help right away if you are not doing well or get worse. Document Released: 08/03/2004 Document Revised: 06/12/2012 Document Reviewed: 03/03/2012 ExitCare Patient Information 2014 [employer].  

## 2013-10-29 LAB — CULTURE, GROUP A STREP: ORGANISM ID, BACTERIA: NORMAL

## 2013-11-12 ENCOUNTER — Ambulatory Visit (INDEPENDENT_AMBULATORY_CARE_PROVIDER_SITE_OTHER): Payer: BC Managed Care – PPO | Admitting: Internal Medicine

## 2013-11-12 ENCOUNTER — Encounter: Payer: Self-pay | Admitting: Internal Medicine

## 2013-11-12 VITALS — BP 128/80 | HR 85 | Temp 98.3°F | Resp 16 | Ht 67.0 in | Wt 194.4 lb

## 2013-11-12 DIAGNOSIS — Z0000 Encounter for general adult medical examination without abnormal findings: Secondary | ICD-10-CM

## 2013-11-12 LAB — POCT URINALYSIS DIPSTICK
BILIRUBIN UA: NEGATIVE
GLUCOSE UA: NEGATIVE
Ketones, UA: NEGATIVE
LEUKOCYTES UA: NEGATIVE
NITRITE UA: NEGATIVE
Protein, UA: 30
RBC UA: NEGATIVE
Spec Grav, UA: 1.025
UROBILINOGEN UA: 0.2
pH, UA: 7

## 2013-11-12 LAB — CBC
HCT: 41.8 % (ref 39.0–52.0)
HEMOGLOBIN: 14.8 g/dL (ref 13.0–17.0)
MCH: 33.6 pg (ref 26.0–34.0)
MCHC: 35.4 g/dL (ref 30.0–36.0)
MCV: 95 fL (ref 78.0–100.0)
Platelets: 315 10*3/uL (ref 150–400)
RBC: 4.4 MIL/uL (ref 4.22–5.81)
RDW: 14.5 % (ref 11.5–15.5)
WBC: 5.2 10*3/uL (ref 4.0–10.5)

## 2013-11-12 LAB — HEMOGLOBIN A1C
Hgb A1c MFr Bld: 6.9 % — ABNORMAL HIGH (ref <5.7)
MEAN PLASMA GLUCOSE: 151 mg/dL — AB (ref <117)

## 2013-11-12 NOTE — Progress Notes (Signed)
Subjective:    Patient ID: Gregory Collins, male    DOB: [birth year], 43 y.o.   MRN: [id number] This chart was scribed for [place]. Laney Pastor, MD by Terressa Koyanagi, ED Scribe. This patient was seen in room 24 and the patient's care was started at 12:29 PM.    HPI HPI Comments:   Gregory Collins is a 43 y.o. male, with a history of GERD, migraine, disaccharide malabsorption, reflux, and tonsillar debris, who presents to the [facility] for his annual exam. Pt denies HA and reports improved reflux symptoms. Pt denies back pain. Pt complains of intermittent mild, wrist pain, bilaterally. Pt denies any new problems and reports he has been feeling well. Pt reports that his father developed DM due to his weight. Pt further reports that his father had prostate cancer 20 years ago. Pt reports his father's cause of death was not prostate cancer.   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  HENT: Negative for dental problem and hearing loss.   Eyes: Negative for visual disturbance.  Respiratory: Negative for cough and shortness of breath.   Cardiovascular: Negative for chest pain, palpitations and leg swelling.  Gastrointestinal: Negative for nausea, abdominal pain, diarrhea and constipation.  Genitourinary: Negative for difficulty urinating.  Musculoskeletal: Negative for back pain, joint swelling, neck pain and neck stiffness.       Occas wrist tendonitis  Neurological: Negative for speech difficulty, light-headedness and headaches.  Hematological: Negative for adenopathy. Does not bruise/bleed easily.  Psychiatric/Behavioral: Negative for behavioral problems, sleep disturbance and dysphoric mood.   Objective:  Physical Exam  Nursing note and vitals reviewed. Constitutional: He is  oriented to person, place, and time. He appears well-developed and well-nourished. No distress.  HENT:  Head: Normocephalic and atraumatic.  Right Ear: External ear normal.  Left Ear: External ear normal.  Nose: Nose normal.  Mouth/Throat: Oropharynx is clear and moist.  Eyes: Conjunctivae and EOM are normal. Pupils are equal, round, and reactive to light. No scleral icterus.  Neck: Normal range of motion. Neck supple. No thyromegaly present.  Cardiovascular: Normal rate and regular rhythm.  Exam reveals no gallop and no friction rub.   No murmur heard. Pulmonary/Chest: Effort normal and breath sounds normal. No respiratory distress. He has no wheezes. He has no rales.  Abdominal: Soft. Bowel sounds are normal. He exhibits no distension. There is no tenderness. There is no rebound.  No hepatoemagly  Musculoskeletal: Normal range of motion. He exhibits no edema and no tenderness.  Neurological: He is alert and oriented to person, place, and time.  Skin: Skin is warm and dry. No rash noted.  Psychiatric: He has a normal mood and affect. His behavior is normal.  BP 128/80   Pulse 85   Temp(Src) 98.3 F (36.8 C) (Oral)   Resp 16   Ht 5\' 7"  (1.702 m)   Wt 194 lb 6.4 oz (88.179 kg)   BMI 30.44 kg/m2   SpO2 98%  Assessment & Plan:  DIAGNOSTIC STUDIES: Oxygen Saturation is 98% on RA, normal by my interpretation.   COORDINATION OF CARE: 12:34 PM-Discussed treatment plan which includes labs, PSA, with pt at bedside and pt agreed to plan.    Routine general medical examination at a health care facility - Plan: CBC, COMPLETE METABOLIC PANEL WITH GFR,  Lipid panel, Hemoglobin A1c, POCT urinalysis dipstick BMI elevated--disc ideal below 165 Father hx Pros Ca---so yearly f/u   I have completed the patient encounter in its entirety as documented by the scribe, with editing by me where necessary. Robert P. Laney Pastor, M.D.

## 2013-11-13 LAB — COMPLETE METABOLIC PANEL WITH GFR
ALT: 23 U/L (ref 0–53)
AST: 24 U/L (ref 0–37)
Albumin: 4.4 g/dL (ref 3.5–5.2)
Alkaline Phosphatase: 49 U/L (ref 39–117)
BUN: 13 mg/dL (ref 6–23)
CO2: 26 meq/L (ref 19–32)
Calcium: 8.9 mg/dL (ref 8.4–10.5)
Chloride: 100 mEq/L (ref 96–112)
Creat: 0.88 mg/dL (ref 0.50–1.35)
GLUCOSE: 134 mg/dL — AB (ref 70–99)
Potassium: 4.4 mEq/L (ref 3.5–5.3)
SODIUM: 136 meq/L (ref 135–145)
TOTAL PROTEIN: 7.2 g/dL (ref 6.0–8.3)
Total Bilirubin: 0.4 mg/dL (ref 0.2–1.2)

## 2013-11-13 LAB — LIPID PANEL
CHOLESTEROL: 210 mg/dL — AB (ref 0–200)
HDL: 34 mg/dL — ABNORMAL LOW (ref >39)
LDL Cholesterol: 106 mg/dL — ABNORMAL HIGH (ref 0–99)
Total CHOL/HDL Ratio: 6.2 Ratio
Triglycerides: 351 mg/dL — ABNORMAL HIGH (ref <150)
VLDL: 70 mg/dL — ABNORMAL HIGH (ref 0–40)

## 2013-11-17 ENCOUNTER — Encounter: Payer: Self-pay | Admitting: Internal Medicine

## 2014-01-21 ENCOUNTER — Ambulatory Visit (INDEPENDENT_AMBULATORY_CARE_PROVIDER_SITE_OTHER): Payer: BC Managed Care – PPO | Admitting: Family Medicine

## 2014-01-21 VITALS — BP 124/76 | HR 91 | Temp 97.8°F | Resp 16 | Ht 67.0 in | Wt 192.4 lb

## 2014-01-21 DIAGNOSIS — R059 Cough, unspecified: Secondary | ICD-10-CM

## 2014-01-21 DIAGNOSIS — F40298 Other specified phobia: Secondary | ICD-10-CM

## 2014-01-21 DIAGNOSIS — R05 Cough: Secondary | ICD-10-CM

## 2014-01-21 DIAGNOSIS — J029 Acute pharyngitis, unspecified: Secondary | ICD-10-CM

## 2014-01-21 DIAGNOSIS — F40243 Fear of flying: Secondary | ICD-10-CM

## 2014-01-21 LAB — POCT RAPID STREP A (OFFICE): Rapid Strep A Screen: NEGATIVE

## 2014-01-21 MED ORDER — AMOXICILLIN 875 MG PO TABS: 875.0000 mg | ORAL_TABLET | Freq: Two times a day (BID) | ORAL | Status: DC

## 2014-01-21 MED ORDER — ALPRAZOLAM 1 MG PO TABS: 1.0000 mg | ORAL_TABLET | Freq: Every day | ORAL | Status: DC | PRN

## 2014-01-21 MED ORDER — FIRST-DUKES MOUTHWASH MT SUSP: OROMUCOSAL | Status: DC

## 2014-01-21 NOTE — Patient Instructions (Signed)
Pharyngitis °Pharyngitis is redness, pain, and swelling (inflammation) of your pharynx.  °CAUSES  °Pharyngitis is usually caused by infection. Most of the time, these infections are from viruses (viral) and are part of a cold. However, sometimes pharyngitis is caused by bacteria (bacterial). Pharyngitis can also be caused by allergies. Viral pharyngitis may be spread from person to person by coughing, sneezing, and personal items or utensils (cups, forks, spoons, toothbrushes). Bacterial pharyngitis may be spread from person to person by more intimate contact, such as kissing.  °SIGNS AND SYMPTOMS  °Symptoms of pharyngitis include:   °· Sore throat.   °· Tiredness (fatigue).   °· Low-grade fever.   °· Headache. °· Joint pain and muscle aches. °· Skin rashes. °· Swollen lymph nodes. °· Plaque-like film on throat or tonsils (often seen with bacterial pharyngitis). °DIAGNOSIS  °Your health care provider will ask you questions about your illness and your symptoms. Your medical history, along with a physical exam, is often all that is needed to diagnose pharyngitis. Sometimes, a rapid strep test is done. Other lab tests may also be done, depending on the suspected cause.  °TREATMENT  °Viral pharyngitis will usually get better in 3-4 days without the use of medicine. Bacterial pharyngitis is treated with medicines that kill germs (antibiotics).  °HOME CARE INSTRUCTIONS  °· Drink enough water and fluids to keep your urine clear or pale yellow.   °· Only take over-the-counter or prescription medicines as directed by your health care provider:   °¨ If you are prescribed antibiotics, make sure you finish them even if you start to feel better.   °¨ Do not take aspirin.   °· Get lots of rest.   °· Gargle with 8 oz of salt water (½ tsp of salt per 1 qt of water) as often as every 1-2 hours to soothe your throat.   °· Throat lozenges (if you are not at risk for choking) or sprays may be used to soothe your throat. °SEEK MEDICAL  CARE IF:  °· You have large, tender lumps in your neck. °· You have a rash. °· You cough up green, yellow-brown, or bloody spit. °SEEK IMMEDIATE MEDICAL CARE IF:  °· Your neck becomes stiff. °· You drool or are unable to swallow liquids. °· You vomit or are unable to keep medicines or liquids down. °· You have severe pain that does not go away with the use of recommended medicines. °· You have trouble breathing (not caused by a stuffy nose). °MAKE SURE YOU:  °· Understand these instructions. °· Will watch your condition. °· Will get help right away if you are not doing well or get worse. °Document Released: 06/26/2005 Document Revised: 04/16/2013 Document Reviewed: 03/03/2013 °ExitCare® Patient Information ©2015 [employer]. This information is not intended to replace advice given to you by your health care provider. Make sure you discuss any questions you have with your health care provider. ° °

## 2014-01-21 NOTE — Progress Notes (Signed)
 Chief Complaint:  Chief Complaint  Patient presents with  . Sore Throat    x 3 days     HPI: Gregory Collins is a 43 y.o. male who is here for sore throat and cough x 3 days. He has a h.o strep , 1 year ago, he teaches at the university. SOmetimes clear yellow sputum. Has children but they are not sick. Has asthma , on qvar daily and proair prn Jerrye Bushy sxs are well controlled, no cough with reflux  Has anxiety with flying so would like alprazolam , going to [facility] for family vacation  No CP or SOB   Past Medical History  Diagnosis Date  . Asthma   . GERD (gastroesophageal reflux disease)    Past Surgical History  Procedure Laterality Date  . Leg surgery      infant   History   Social History  . Marital Status: Married    Spouse Name: N/A    Number of Children: N/A  . Years of Education: N/A   Social History Main Topics  . Smoking status: Never Smoker   . Smokeless tobacco: Never Used  . Alcohol Use: Yes     Comment: social  . Drug Use: No  . Sexual Activity: Yes    Birth Control/ Protection: None   Other Topics Concern  . None   Social History Narrative   ** Merged History Encounter **       Family History  Problem Relation Age of Onset  . Diabetes Mother   . Kidney disease Mother   . Diabetes Father   . Prostate cancer Father    No Known Allergies Prior to Admission medications   Medication Sig Start Date End Date Taking? Authorizing Provider  omeprazole (PRILOSEC) 10 MG capsule Take 10 mg by mouth as needed.   Yes Historical Provider, MD     ROS: The patient denies fevers, chills, night sweats, unintentional weight loss, chest pain, palpitations, wheezing, dyspnea on exertion, nausea, vomiting, abdominal pain, dysuria, hematuria, melena, numbness, weakness, or tingling.   All other systems have been reviewed and were otherwise negative with the exception of those mentioned in the HPI and as above.    PHYSICAL EXAM: Filed Vitals:   01/21/14  0838  BP: 124/76  Pulse: 91  Temp: 97.8 F (36.6 C)  Resp: 16   Filed Vitals:   01/21/14 0838  Height: 5\' 7"  (1.702 m)  Weight: 192 lb 6.4 oz (87.272 kg)   Body mass index is 30.13 kg/(m^2).  General: Alert, no acute distress HEENT:  Normocephalic, atraumatic, oropharynx patent. EOMI, PERRLA, + debirs in right tonsillar crypt but no exudate, + erythema, + minimal tonsillar swelling insignificant. TMs are normal Cardiovascular:  Regular rate and rhythm, no rubs murmurs or gallops. Radial pulse intact. No pedal edema.  Respiratory: Clear to auscultation bilaterally.  No wheezes, rales, or rhonchi.  No cyanosis, no use of accessory musculature GI: No organomegaly, abdomen is soft and non-tender, positive bowel sounds.  No masses. Skin: No rashes. Neurologic: Facial musculature symmetric. Psychiatric: Patient is appropriate throughout our interaction. Lymphatic: No cervical lymphadenopathy Musculoskeletal: Gait intact.   LABS: Results for orders placed in visit on 11/12/13  CBC      Result Value Ref Range   WBC 5.2  4.0 - 10.5 K/uL   RBC 4.40  4.22 - 5.81 MIL/uL   Hemoglobin 14.8  13.0 - 17.0 g/dL   HCT 41.8  39.0 - 52.0 %  MCV 95.0  78.0 - 100.0 fL   MCH 33.6  26.0 - 34.0 pg   MCHC 35.4  30.0 - 36.0 g/dL   RDW 14.5  11.5 - 15.5 %   Platelets 315  150 - 400 K/uL  COMPTE METABOLIC PANEL WITH GFR      Result Value Ref Range   Sodium 136  135 - 145 mEq/L   Potassium 4.4  3.5 - 5.3 mEq/L   Chloride 100  96 - 112 mEq/L   CO2 26  19 - 32 mEq/L   Glucose, Bld 134 (*) 70 - 99 mg/dL   BUN 13  6 - 23 mg/dL   Creat 0.88  0.50 - 1.35 mg/dL   Total Bilirubin 0.4  0.2 - 1.2 mg/dL   Alkaline Phosphatase 49  39 - 117 U/L   AST 24  0 - 37 U/L   ALT 23  0 - 53 U/L   Total Protein 7.2  6.0 - 8.3 g/dL   Albumin 4.4  3.5 - 5.2 g/dL   Calcium 8.9  8.4 - 10.5 mg/dL   GFR, Est African American >89     GFR, Est Non African American >89    LIPID PANEL      Result Value Ref Range    Cholesterol 210 (*) 0 - 200 mg/dL   Triglycerides 351 (*) <150 mg/dL   HDL 34 (*) >39 mg/dL   Total CHOL/HDL Ratio 6.2     VLDL 70 (*) 0 - 40 mg/dL   LDL Cholesterol 106 (*) 0 - 99 mg/dL  HEMOGLOBIN A1C      Result Value Ref Range   Hemoglobin A1C 6.9 (*) <5.7 %   Mean Plasma Glucose 151 (*) <117 mg/dL  POCT URINALYSIS DIPSTICK      Result Value Ref Range   Color, UA yellow     Clarity, UA clear     Glucose, UA neg     Bilirubin, UA neg     Ketones, UA neg     Spec Grav, UA 1.025     Blood, UA neg     pH, UA 7.0     Protein, UA 30     Urobilinogen, UA 0.2     Nitrite, UA neg     Leukocytes, UA Negative       EKG/XRAY:   Primary read interpreted by Dr. Marin Comment at [facility].   ASSESSMENT/PLAN: Encounter Diagnoses  Name Primary?  . Acute pharyngitis, unspecified pharyngitis type Yes  . Cough   . Phobia, flying    Rx Amoxacillin , Dukes magic mouth wash Rx Alprazolam for flying to [facility]  Strep throat rapid and cx pending.  F/u prn  Gross sideeffects, risk and benefits, and alternatives of medications d/w patient. Patient is aware that all medications have potential sideeffects and we are unable to predict every sideeffect or drug-drug interaction that may occur.  , [place], DO 01/21/2014 9:13 AM

## 2014-01-23 LAB — CULTURE, GROUP A STREP: Organism ID, Bacteria: NORMAL

## 2014-05-26 ENCOUNTER — Ambulatory Visit (INDEPENDENT_AMBULATORY_CARE_PROVIDER_SITE_OTHER): Payer: BC Managed Care – PPO | Admitting: Family Medicine

## 2014-05-26 VITALS — BP 140/88 | HR 101 | Temp 97.6°F | Resp 16 | Ht 67.0 in | Wt 186.6 lb

## 2014-05-26 DIAGNOSIS — M791 Myalgia, unspecified site: Secondary | ICD-10-CM

## 2014-05-26 DIAGNOSIS — L853 Xerosis cutis: Secondary | ICD-10-CM

## 2014-05-26 DIAGNOSIS — L299 Pruritus, unspecified: Secondary | ICD-10-CM

## 2014-05-26 MED ORDER — HYDROXYZINE HCL 25 MG PO TABS: 25.0000 mg | ORAL_TABLET | Freq: Three times a day (TID) | ORAL | Status: DC | PRN

## 2014-05-26 NOTE — Progress Notes (Signed)
Subjective:    Patient ID: Gregory Collins, male    DOB: June 12, 1971, 43 y.o.   MRN: [id number] This chart was scribed for [name], MD by Zola Button, Medical Scribe. This patient was seen in Room 11 and the patient's care was started at 8:43 AM.    HPI HPI Comments: Gregory Collins is a 43 y.o. male with a hx of shingles who presents to the [facility] complaining of generalized pain and itching that began about 1.5 weeks ago; he thinks he may have shingles. Patient also notes having a rash on his back. He denies fever, numbness and tingling, and other symptoms. He denies new detergents, lotions, etc. Patient also denies new medications or starting any new exercises. He has hx of shingles about 10 years ago and the current symptoms feel similar to when he had shingles. Patient normally showers where he works, at [employer].   Patient Active Problem List   Diagnosis Date Noted  . Tonsillar debris 06/13/2013  . Migraine 08/20/2011  . Reflux 08/20/2011  . GERD (gastroesophageal reflux disease) 06/20/2011  . 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 Asthma   . GERD (gastroesophageal reflux disease)    Past Surgical History  Procedure Laterality Date  . Leg surgery      infant   No Known Allergies Prior to Admission medications   Medication Sig Start Date End Date Taking? Authorizing Provider  ALPRAZolam Duanne Moron) 1 MG tablet Take 1 tablet (1 mg total) by mouth daily as needed for anxiety. 01/21/14   Thao P Le, DO  amoxicillin (AMOXIL) 875 MG tablet Take 1 tablet (875 mg total) by mouth 2 (two) times daily. 01/21/14   Thao P Le, DO  Diphenhyd-Hydrocort-Nystatin (FIRST-DUKES MOUTHWASH) SUSP 1 teaspoon as rinse gargle with 1:1 viscous lidocaine and spit 4 times a day prn 01/21/14   Thao P Le, DO  omeprazole (PRILOSEC) 10 MG capsule Take 10 mg by mouth as needed.    Historical Provider, MD   History   Social History  . Marital  Status: Married    Spouse Name: N/A    Number of Children: N/A  . Years of Education: N/A   Occupational History  . Not on file.   Social History Main Topics  . Smoking status: Never Smoker   . Smokeless tobacco: Never Used  . Alcohol Use: Yes     Comment: social  . Drug Use: No  . Sexual Activity: Yes    Birth Control/ Protection: None   Other Topics Concern  . Not on file   Social History Narrative   ** Merged History Encounter **         Review of Systems  Constitutional: Negative for fever and activity change.  Musculoskeletal: Positive for myalgias.  Skin: Positive for rash.  Neurological: Negative for numbness.       Objective:   Filed Vitals:   05/26/14 0812  BP: 140/88  Pulse: 101  Temp: 97.6 F (36.4 C)  TempSrc: Oral  Resp: 16  Height: 5\' 7"  (1.702 m)  Weight: 186 lb 9.6 oz (84.641 kg)  SpO2: 97%    Physical Exam  Constitutional: He is oriented to person, place, and time. He appears well-developed and well-nourished. No distress.  HENT:  Head: Normocephalic and atraumatic.  Mouth/Throat: Oropharynx is clear and moist. No oropharyngeal exudate.  Eyes: Pupils are equal, round, and reactive to light.  Neck: Neck supple.  Cardiovascular:  Normal rate.   Pulmonary/Chest: Effort normal.  Musculoskeletal: He exhibits no edema.  Neurological: He is alert and oriented to person, place, and time. No cranial nerve deficit.  Skin: Skin is warm and dry. Rash noted.  Patch on his left flank. Small areas of hypopigmentation approx 3x2 cm, no vesicles or redness. No skin sensitiviy. No other rash. Few other areas of hypopigmentation on his trunk, but he states they have been there chronically. There is some dry skin on the dorsum of hands.  Psychiatric: He has a normal mood and affect. His behavior is normal.  Nursing note and vitals reviewed.         Assessment & Plan:   Gregory Collins is a 43 y.o. male Pruritus - Plan: hydrOXYzine (ATARAX/VISTARIL)  25 MG tablet  Myalgia  Dry skin  Myalgias and diffuse pruritus. No specific rash seen and diffuse nature - doubt zoster.  denies change in derm products - less likely contact derm. Dry skin noted, may be pruritus form this.   -dry skin care as below with Dove, moisturizers.   -hydroxyzine if not improved with otc zyrtec - SED.   -tylenol or advil prn myalgias, but if these increase/persist, fever, or new/worsening symptoms.  RTC precautions given.   Meds ordered this encounter  Medications  . hydrOXYzine (ATARAX/VISTARIL) 25 MG tablet    Sig: Take 1 tablet (25 mg total) by mouth 3 (three) times daily as needed for itching.    Dispense:  30 tablet    Refill:  0   Patient Instructions  I do not see a rash consistent with shingles today, and unlikely with various areas involved. Start with aveeno lotion after bathing, and as needed throughout day. Dove soap or body wash for men. Zyrtec for itching once per day, but if needing something stronger for itching - can take hydroxyzine as discussed (this is sedating). For muscle aches,  Can take over the counter advil if needed, but if any worsening of your symptoms, or new rash  - return for recheck.   Drink at least 64 ounces of water daily. Consider a humidifier for the room where you sleep. Bathe once daily. Avoid using HOT water, as it dries skin.  Avoid deodorant soaps (Dial is the worst!) and stick with gentle cleansers (like Cetaphil Liquid Cleanser). After bathing, dry off completely, then apply a thick emollient cream (like Cetaphil Moisturizing Cream, or if lighter lotion needed - Aveeno).  Apply the cream twice daily, or more!       I personally performed the services described in this documentation, which was scribed in my presence. The recorded information has been reviewed and considered, and addended by me as needed.

## 2014-05-26 NOTE — Patient Instructions (Signed)
I do not see a rash consistent with shingles today, and unlikely with various areas involved. Start with aveeno lotion after bathing, and as needed throughout day. Dove soap or body wash for men. Zyrtec for itching once per day, but if needing something stronger for itching - can take hydroxyzine as discussed (this is sedating). For muscle aches,  Can take over the counter advil if needed, but if any worsening of your symptoms, or new rash  - return for recheck.   Drink at least 64 ounces of water daily. Consider a humidifier for the room where you sleep. Bathe once daily. Avoid using HOT water, as it dries skin.  Avoid deodorant soaps (Dial is the worst!) and stick with gentle cleansers (like Cetaphil Liquid Cleanser). After bathing, dry off completely, then apply a thick emollient cream (like Cetaphil Moisturizing Cream, or if lighter lotion needed - Aveeno).  Apply the cream twice daily, or more!

## 2014-07-14 ENCOUNTER — Ambulatory Visit (INDEPENDENT_AMBULATORY_CARE_PROVIDER_SITE_OTHER): Payer: BLUE CROSS/BLUE SHIELD | Admitting: Internal Medicine

## 2014-07-14 VITALS — BP 156/82 | HR 106 | Temp 97.7°F | Resp 18 | Ht 68.5 in | Wt 196.4 lb

## 2014-07-14 DIAGNOSIS — R519 Headache, unspecified: Secondary | ICD-10-CM

## 2014-07-14 DIAGNOSIS — H538 Other visual disturbances: Secondary | ICD-10-CM

## 2014-07-14 DIAGNOSIS — R7309 Other abnormal glucose: Secondary | ICD-10-CM

## 2014-07-14 DIAGNOSIS — R7303 Prediabetes: Secondary | ICD-10-CM

## 2014-07-14 DIAGNOSIS — R51 Headache: Secondary | ICD-10-CM

## 2014-07-14 DIAGNOSIS — G43909 Migraine, unspecified, not intractable, without status migrainosus: Secondary | ICD-10-CM

## 2014-07-14 LAB — POCT CBC
Granulocyte percent: 54.6 % (ref 37–80)
HCT, POC: 43 % — AB (ref 43.5–53.7)
Hemoglobin: 14.4 g/dL (ref 14.1–18.1)
Lymph, poc: 2.3 (ref 0.6–3.4)
MCH, POC: 34.1 pg — AB (ref 27–31.2)
MCHC: 33.4 g/dL (ref 31.8–35.4)
MCV: 101.9 fL — AB (ref 80–97)
MID (cbc): 0.2 (ref 0–0.9)
MPV: 7.6 fL (ref 0–99.8)
POC Granulocyte: 3.1 (ref 2–6.9)
POC LYMPH PERCENT: 41.3 % (ref 10–50)
POC MID %: 4.1 % (ref 0–12)
Platelet Count, POC: 260 10*3/uL (ref 142–424)
RBC: 4.22 M/uL — AB (ref 4.69–6.13)
RDW, POC: 16.7 %
WBC: 5.6 10*3/uL (ref 4.6–10.2)

## 2014-07-14 LAB — COMPREHENSIVE METABOLIC PANEL
ALT: 27 U/L (ref 0–53)
AST: 23 U/L (ref 0–37)
Albumin: 4.5 g/dL (ref 3.5–5.2)
Alkaline Phosphatase: 47 U/L (ref 39–117)
BILIRUBIN TOTAL: 0.5 mg/dL (ref 0.2–1.2)
BUN: 14 mg/dL (ref 6–23)
CO2: 27 meq/L (ref 19–32)
CREATININE: 0.93 mg/dL (ref 0.50–1.35)
Calcium: 8.7 mg/dL (ref 8.4–10.5)
Chloride: 98 mEq/L (ref 96–112)
GLUCOSE: 151 mg/dL — AB (ref 70–99)
Potassium: 4 mEq/L (ref 3.5–5.3)
Sodium: 138 mEq/L (ref 135–145)
Total Protein: 7.5 g/dL (ref 6.0–8.3)

## 2014-07-14 LAB — POCT GLYCOSYLATED HEMOGLOBIN (HGB A1C): Hemoglobin A1C: 6.6

## 2014-07-14 LAB — GLUCOSE, POCT (MANUAL RESULT ENTRY): POC Glucose: 140 mg/dL — AB (ref 70–99)

## 2014-07-14 LAB — LIPID PANEL
CHOL/HDL RATIO: 5.9 ratio
Cholesterol: 242 mg/dL — ABNORMAL HIGH (ref 0–200)
HDL: 41 mg/dL (ref >39)
LDL Cholesterol: 145 mg/dL — ABNORMAL HIGH (ref 0–99)
Triglycerides: 281 mg/dL — ABNORMAL HIGH (ref <150)
VLDL: 56 mg/dL — AB (ref 0–40)

## 2014-07-14 MED ORDER — METFORMIN HCL 500 MG PO TABS: 500.0000 mg | ORAL_TABLET | Freq: Every day | ORAL | Status: DC

## 2014-07-14 NOTE — Patient Instructions (Signed)
Migraine Headache A migraine headache is an intense, throbbing pain on one or both sides of your head. A migraine can last for 30 minutes to several hours. CAUSES  The exact cause of a migraine headache is not always known. However, a migraine may be caused when nerves in the brain become irritated and release chemicals that cause inflammation. This causes pain. Certain things may also trigger migraines, such as:  Alcohol.  Smoking.  Stress.  Menstruation.  Aged cheeses.  Foods or drinks that contain nitrates, glutamate, aspartame, or tyramine.  Lack of sleep.  Chocolate.  Caffeine.  Hunger.  Physical exertion.  Fatigue.  Medicines used to treat chest pain (nitroglycerine), birth control pills, estrogen, and some blood pressure medicines. SIGNS AND SYMPTOMS  Pain on one or both sides of your head.  Pulsating or throbbing pain.  Severe pain that prevents daily activities.  Pain that is aggravated by any physical activity.  Nausea, vomiting, or both.  Dizziness.  Pain with exposure to bright lights, loud noises, or activity.  General sensitivity to bright lights, loud noises, or smells. Before you get a migraine, you may get warning signs that a migraine is coming (aura). An aura may include:  Seeing flashing lights.  Seeing bright spots, halos, or zigzag lines.  Having tunnel vision or blurred vision.  Having feelings of numbness or tingling.  Having trouble talking.  Having muscle weakness. DIAGNOSIS  A migraine headache is often diagnosed based on:  Symptoms.  Physical exam.  A CT scan or MRI of your head. These imaging tests cannot diagnose migraines, but they can help rule out other causes of headaches. TREATMENT Medicines may be given for pain and nausea. Medicines can also be given to help prevent recurrent migraines.  HOME CARE INSTRUCTIONS  Only take over-the-counter or prescription medicines for pain or discomfort as directed by your  health care provider. The use of long-term narcotics is not recommended.  Lie down in a dark, quiet room when you have a migraine.  Keep a journal to find out what may trigger your migraine headaches. For example, write down:  What you eat and drink.  How much sleep you get.  Any change to your diet or medicines.  Limit alcohol consumption.  Quit smoking if you smoke.  Get 7-9 hours of sleep, or as recommended by your health care provider.  Limit stress.  Keep lights dim if bright lights bother you and make your migraines worse. SEEK IMMEDIATE MEDICAL CARE IF:   Your migraine becomes severe.  You have a fever.  You have a stiff neck.  You have vision loss.  You have muscular weakness or loss of muscle control.  You start losing your balance or have trouble walking.  You feel faint or pass out.  You have severe symptoms that are different from your first symptoms. MAKE SURE YOU:   Understand these instructions.  Will watch your condition.  Will get help right away if you are not doing well or get worse. Document Released: 06/26/2005 Document Revised: 11/10/2013 Document Reviewed: 03/03/2013 [facility] Patient Information 2015 [place], [place]. This information is not intended to replace advice given to you by your health care provider. Make sure you discuss any questions you have with your health care provider. Basic Carbohydrate Counting for Diabetes Mellitus Carbohydrate counting is a method for keeping track of the amount of carbohydrates you eat. Eating carbohydrates naturally increases the level of sugar (glucose) in your blood, so it is important for you to  know the amount that is okay for you to have in every meal. Carbohydrate counting helps keep the level of glucose in your blood within normal limits. The amount of carbohydrates allowed is different for every person. A dietitian can help you calculate the amount that is right for you. Once you know the amount of  carbohydrates you can have, you can count the carbohydrates in the foods you want to eat. Carbohydrates are found in the following foods:  Grains, such as breads and cereals.  Dried beans and soy products.  Starchy vegetables, such as potatoes, peas, and corn.  Fruit and fruit juices.  Milk and yogurt.  Sweets and snack foods, such as cake, cookies, candy, chips, soft drinks, and fruit drinks. CARBOHYDRATE COUNTING There are two ways to count the carbohydrates in your food. You can use either of the methods or a combination of both. Reading the "Nutrition Facts" on [place] The "Nutrition Facts" is an area that is included on the labels of almost all packaged food and beverages in the [place]. It includes the serving size of that food or beverage and information about the nutrients in each serving of the food, including the grams (g) of carbohydrate per serving.  Decide the number of servings of this food or beverage that you will be able to eat or drink. Multiply that number of servings by the number of grams of carbohydrate that is listed on the label for that serving. The total will be the amount of carbohydrates you will be having when you eat or drink this food or beverage. Learning Standard Serving Sizes of Food When you eat food that is not packaged or does not include "Nutrition Facts" on the label, you need to measure the servings in order to count the amount of carbohydrates.A serving of most carbohydrate-rich foods contains about 15 g of carbohydrates. The following list includes serving sizes of carbohydrate-rich foods that provide 15 g ofcarbohydrate per serving:   1 slice of bread (1 oz) or 1 six-inch tortilla.    of a hamburger bun or English muffin.  4-6 crackers.   cup unsweetened dry cereal.    cup hot cereal.   cup rice or pasta.    cup mashed potatoes or  of a large baked potato.  1 cup fresh fruit or one small piece of fruit.    cup  canned or frozen fruit or fruit juice.  1 cup milk.   cup plain fat-free yogurt or yogurt sweetened with artificial sweeteners.   cup cooked dried beans or starchy vegetable, such as peas, corn, or potatoes.  Decide the number of standard-size servings that you will eat. Multiply that number of servings by 15 (the grams of carbohydrates in that serving). For example, if you eat 2 cups of strawberries, you will have eaten 2 servings and 30 g of carbohydrates (2 servings x 15 g = 30 g). For foods such as soups and casseroles, in which more than one food is mixed in, you will need to count the carbohydrates in each food that is included. EXAMPLE OF CARBOHYDRATE COUNTING Sample Dinner  3 oz chicken breast.   cup of brown rice.   cup of corn.  1 cup milk.   1 cup strawberries with sugar-free whipped topping.  Carbohydrate Calculation Step 1: Identify the foods that contain carbohydrates:   Rice.   Corn.   Milk.   Strawberries. Step 2:Calculate the number of servings eaten of each:   2 servings  of rice.   1 serving of corn.   1 serving of milk.   1 serving of strawberries. Step 3: Multiply each of those number of servings by 15 g:   2 servings of rice x 15 g = 30 g.   1 serving of corn x 15 g = 15 g.   1 serving of milk x 15 g = 15 g.   1 serving of strawberries x 15 g = 15 g. Step 4: Add together all of the amounts to find the total grams of carbohydrates eaten: 30 g + 15 g + 15 g + 15 g = 75 g. Document Released: 06/26/2005 Document Revised: 11/10/2013 Document Reviewed: 05/23/2013 [facility] Patient Information 2015 [place], [place]. This information is not intended to replace advice given to you by your health care provider. Make sure you discuss any questions you have with your health care provider. Calorie Counting for Weight Loss Calories are energy you get from the things you eat and drink. Your body uses this energy to keep you going throughout  the day. The number of calories you eat affects your weight. When you eat more calories than your body needs, your body stores the extra calories as fat. When you eat fewer calories than your body needs, your body burns fat to get the energy it needs. Calorie counting means keeping track of how many calories you eat and drink each day. If you make sure to eat fewer calories than your body needs, you should lose weight. In order for calorie counting to work, you will need to eat the number of calories that are right for you in a day to lose a healthy amount of weight per week. A healthy amount of weight to lose per week is usually 1-2 lb (0.5-0.9 kg). A dietitian can determine how many calories you need in a day and give you suggestions on how to reach your calorie goal.  WHAT IS MY MY PLAN? My goal is to have __________ calories per day.  If I have this many calories per day, I should lose around __________ pounds per week. WHAT DO I NEED TO KNOW ABOUT CALORIE COUNTING? In order to meet your daily calorie goal, you will need to:  Find out how many calories are in each food you would like to eat. Try to do this before you eat.  Decide how much of the food you can eat.  Write down what you ate and how many calories it had. Doing this is called keeping a food log. WHERE DO I FIND CALORIE INFORMATION? The number of calories in a food can be found on a Nutrition Facts label. Note that all the information on a label is based on a specific serving of the food. If a food does not have a Nutrition Facts label, try to look up the calories online or ask your dietitian for help. HOW DO I DECIDE HOW MUCH TO EAT? To decide how much of the food you can eat, you will need to consider both the number of calories in one serving and the size of one serving. This information can be found on the Nutrition Facts label. If a food does not have a Nutrition Facts label, look up the information online or ask your dietitian  for help. Remember that calories are listed per serving. If you choose to have more than one serving of a food, you will have to multiply the calories per serving by the amount of servings you  plan to eat. For example, the label on a package of bread might say that a serving size is 1 slice and that there are 90 calories in a serving. If you eat 1 slice, you will have eaten 90 calories. If you eat 2 slices, you will have eaten 180 calories. HOW DO I KEEP A FOOD LOG? After each meal, record the following information in your food log:  What you ate.  How much of it you ate.  How many calories it had.  Then, add up your calories. Keep your food log near you, such as in a small notebook in your pocket. Another option is to use a mobile app or website. Some programs will calculate calories for you and show you how many calories you have left each time you add an item to the log. WHAT ARE SOME CALORIE COUNTING TIPS?  Use your calories on foods and drinks that will fill you up and not leave you hungry. Some examples of this include foods like nuts and nut butters, vegetables, lean proteins, and high-fiber foods (more than 5 g fiber per serving).  Eat nutritious foods and avoid empty calories. Empty calories are calories you get from foods or beverages that do not have many nutrients, such as candy and soda. It is better to have a nutritious high-calorie food (such as an avocado) than a food with few nutrients (such as a bag of chips).  Know how many calories are in the foods you eat most often. This way, you do not have to look up how many calories they have each time you eat them.  Look out for foods that may seem like low-calorie foods but are really high-calorie foods, such as baked goods, soda, and fat-free candy.  Pay attention to calories in drinks. Drinks such as sodas, specialty coffee drinks, alcohol, and juices have a lot of calories yet do not fill you up. Choose low-calorie drinks like  water and diet drinks.  Focus your calorie counting efforts on higher calorie items. Logging the calories in a garden salad that contains only vegetables is less important than calculating the calories in a milk shake.  Find a way of tracking calories that works for you. Get creative. Most people who are successful find ways to keep track of how much they eat in a day, even if they do not count every calorie. WHAT ARE SOME PORTION CONTROL TIPS?  Know how many calories are in a serving. This will help you know how many servings of a certain food you can have.  Use a measuring cup to measure serving sizes. This is helpful when you start out. With time, you will be able to estimate serving sizes for some foods.  Take some time to put servings of different foods on your favorite plates, bowls, and cups so you know what a serving looks like.  Try not to eat straight from a bag or box. Doing this can lead to overeating. Put the amount you would like to eat in a cup or on a plate to make sure you are eating the right portion.  Use smaller plates, glasses, and bowls to prevent overeating. This is a quick and easy way to practice portion control. If your plate is smaller, less food can fit on it.  Try not to multitask while eating, such as watching TV or using your computer. If it is time to eat, sit down at a table and enjoy your food. Doing this will help  you to start recognizing when you are full. It will also make you more aware of what and how much you are eating. HOW CAN I CALORIE COUNT WHEN EATING OUT?  Ask for smaller portion sizes or child-sized portions.  Consider sharing an entree and sides instead of getting your own entree.  If you get your own entree, eat only half. Ask for a box at the beginning of your meal and put the rest of your entree in it so you are not tempted to eat it.  Look for the calories on the menu. If calories are listed, choose the lower calorie options.  Choose  dishes that include vegetables, fruits, whole grains, low-fat dairy products, and lean protein. Focusing on smart food choices from each of the 5 food groups can help you stay on track at restaurants.  Choose items that are boiled, broiled, grilled, or steamed.  Choose water, milk, unsweetened iced tea, or other drinks without added sugars. If you want an alcoholic beverage, choose a lower calorie option. For example, a regular margarita can have up to 700 calories and a glass of wine has around 150.  Stay away from items that are buttered, battered, fried, or served with cream sauce. Items labeled "crispy" are usually fried, unless stated otherwise.  Ask for dressings, sauces, and syrups on the side. These are usually very high in calories, so do not eat much of them.  Watch out for salads. Many people think salads are a healthy option, but this is often not the case. Many salads come with bacon, fried chicken, lots of cheese, fried chips, and dressing. All of these items have a lot of calories. If you want a salad, choose a garden salad and ask for grilled meats or steak. Ask for the dressing on the side, or ask for olive oil and vinegar or lemon to use as dressing.  Estimate how many servings of a food you are given. For example, a serving of cooked rice is  cup or about the size of half a tennis ball or one cupcake wrapper. Knowing serving sizes will help you be aware of how much food you are eating at restaurants. The list below tells you how big or small some common portion sizes are based on everyday objects.  1 oz--4 stacked dice.  3 oz--1 deck of cards.  1 tsp--1 dice.  1 Tbsp-- a Ping-Pong ball.  2 Tbsp--1 Ping-Pong ball.   cup--1 tennis ball or 1 cupcake wrapper.  1 cup--1 baseball. Document Released: 06/26/2005 Document Revised: 11/10/2013 Document Reviewed: 05/01/2013 [facility] Patient Information 2015 [place], [place]. This information is not intended to replace advice  given to you by your health care provider. Make sure you discuss any questions you have with your health care provider. Diabetes and Exercise Exercising regularly is important. It is not just about losing weight. It has many health benefits, such as:  Improving your overall fitness, flexibility, and endurance.  Increasing your bone density.  Helping with weight control.  Decreasing your body fat.  Increasing your muscle strength.  Reducing stress and tension.  Improving your overall health. People with diabetes who exercise gain additional benefits because exercise:  Reduces appetite.  Improves the body's use of blood sugar (glucose).  Helps lower or control blood glucose.  Decreases blood pressure.  Helps control blood lipids (such as cholesterol and triglycerides).  Improves the body's use of the hormone insulin by:  Increasing the body's insulin sensitivity.  Reducing the body's insulin needs.  Decreases the risk for heart disease because exercising:  Lowers cholesterol and triglycerides levels.  Increases the levels of good cholesterol (such as high-density lipoproteins [HDL]) in the body.  Lowers blood glucose levels. YOUR ACTIVITY PLAN  Choose an activity that you enjoy and set realistic goals. Your health care provider or diabetes educator can help you make an activity plan that works for you. Exercise regularly as directed by your health care provider. This includes:  Performing resistance training twice a week such as push-ups, sit-ups, lifting weights, or using resistance bands.  Performing 150 minutes of cardio exercises each week such as walking, running, or playing sports.  Staying active and spending no more than 90 minutes at one time being inactive. Even short bursts of exercise are good for you. Three 10-minute sessions spread throughout the day are just as beneficial as a single 30-minute session. Some exercise ideas include:  Taking the dog for a  walk.  Taking the stairs instead of the elevator.  Dancing to your favorite song.  Doing an exercise video.  Doing your favorite exercise with a friend. RECOMMENDATIONS FOR EXERCISING WITH TYPE 1 OR TYPE 2 DIABETES   Check your blood glucose before exercising. If blood glucose levels are greater than 240 mg/dL, check for urine ketones. Do not exercise if ketones are present.  Avoid injecting insulin into areas of the body that are going to be exercised. For example, avoid injecting insulin into:  The arms when playing tennis.  The legs when jogging.  Keep a record of:  Food intake before and after you exercise.  Expected peak times of insulin action.  Blood glucose levels before and after you exercise.  The type and amount of exercise you have done.  Review your records with your health care provider. Your health care provider will help you to develop guidelines for adjusting food intake and insulin amounts before and after exercising.  If you take insulin or oral hypoglycemic agents, watch for signs and symptoms of hypoglycemia. They include:  Dizziness.  Shaking.  Sweating.  Chills.  Confusion.  Drink plenty of water while you exercise to prevent dehydration or heat stroke. Body water is lost during exercise and must be replaced.  Talk to your health care provider before starting an exercise program to make sure it is safe for you. Remember, almost any type of activity is better than none. Document Released: 09/16/2003 Document Revised: 11/10/2013 Document Reviewed: 12/03/2012 [facility] Patient Information 2015 [place], [place]. This information is not intended to replace advice given to you by your health care provider. Make sure you discuss any questions you have with your health care provider.

## 2014-07-14 NOTE — Progress Notes (Signed)
   Subjective:    Patient ID: Gregory Collins, male    DOB: May 04, 1971, 44 y.o.   MRN: [id number]  HPI CO HAs, blurry vision for 3 weeks. Hx of migraines and hx of glucose intolerance. Has gained weight. Drinks lots of water, no polyuria. Was here many times for many things. The HAs feel like his routine , he does not want a scan. Has seen neurology and been scanned.   Review of Systems     Objective:   Physical Exam  Constitutional: He is oriented to person, place, and time. He appears well-developed and well-nourished. No distress.  HENT:  Head: Normocephalic.  Mouth/Throat: Oropharynx is clear and moist.  Eyes: Conjunctivae and EOM are normal. Pupils are equal, round, and reactive to light.  Neck: Normal range of motion. Neck supple.  Cardiovascular: Normal rate, regular rhythm and normal heart sounds.   No murmur heard. Pulmonary/Chest: Effort normal and breath sounds normal.  Musculoskeletal: Normal range of motion.  Lymphadenopathy:    He has no cervical adenopathy.  Neurological: He is alert and oriented to person, place, and time. He has normal strength. No cranial nerve deficit or sensory deficit. He displays a negative Romberg sign. Gait normal.  No drift FN normal Balance excellent  Psychiatric: He has a normal mood and affect. His behavior is normal. Judgment and thought content normal.     Results for orders placed or performed in visit on 07/14/14  POCT CBC  Result Value Ref Range   WBC 5.6 4.6 - 10.2 K/uL   Lymph, poc 2.3 0.6 - 3.4   POC LYMPH PERCENT 41.3 10 - 50 %L   MID (cbc) 0.2 0 - 0.9   POC MID % 4.1 0 - 12 %M   POC Granulocyte 3.1 2 - 6.9   Granulocyte percent 54.6 37 - 80 %G   RBC 4.22 (A) 4.69 - 6.13 M/uL   Hemoglobin 14.4 14.1 - 18.1 g/dL   HCT, POC 43.0 (A) 43.5 - 53.7 %   MCV 101.9 (A) 80 - 97 fL   MCH, POC 34.1 (A) 27 - 31.2 pg   MCHC 33.4 31.8 - 35.4 g/dL   RDW, POC 16.7 %   Platelet Count, POC 260 142 - 424 K/uL   MPV 7.6 0 - 99.8 fL    POCT glucose (manual entry)  Result Value Ref Range   POC Glucose 140 (A) 70 - 99 mg/dl  POCT glycosylated hemoglobin (Hb A1C)  Result Value Ref Range   Hemoglobin A1C 6.6         Assessment & Plan:

## 2014-07-16 ENCOUNTER — Telehealth: Payer: Self-pay | Admitting: *Deleted

## 2014-07-16 NOTE — Telephone Encounter (Signed)
Pt has question about lab work.  lmom to cb

## 2014-07-16 NOTE — Telephone Encounter (Signed)
Please evaluated results to give to pt.

## 2014-07-17 LAB — HM DIABETES EYE EXAM

## 2014-09-30 ENCOUNTER — Other Ambulatory Visit (HOSPITAL_COMMUNITY): Payer: Self-pay | Admitting: Internal Medicine

## 2014-09-30 DIAGNOSIS — R1011 Right upper quadrant pain: Secondary | ICD-10-CM

## 2014-10-02 ENCOUNTER — Ambulatory Visit (HOSPITAL_COMMUNITY)
Admission: RE | Admit: 2014-10-02 | Discharge: 2014-10-02 | Disposition: A | Discharge status: Home / Self Care | Payer: BC Managed Care – PPO | Source: Ambulatory Visit | Attending: Internal Medicine | Admitting: Internal Medicine

## 2014-10-02 DIAGNOSIS — E119 Type 2 diabetes mellitus without complications: Secondary | ICD-10-CM | POA: Insufficient documentation

## 2014-10-02 DIAGNOSIS — R1011 Right upper quadrant pain: Secondary | ICD-10-CM | POA: Diagnosis not present

## 2014-10-02 DIAGNOSIS — K219 Gastro-esophageal reflux disease without esophagitis: Secondary | ICD-10-CM | POA: Diagnosis not present

## 2014-11-10 ENCOUNTER — Ambulatory Visit (INDEPENDENT_AMBULATORY_CARE_PROVIDER_SITE_OTHER): Payer: BC Managed Care – PPO | Admitting: Internal Medicine

## 2014-11-10 ENCOUNTER — Other Ambulatory Visit: Payer: Self-pay | Admitting: Internal Medicine

## 2014-11-10 VITALS — BP 144/80 | HR 92 | Temp 97.9°F | Resp 16 | Ht 68.5 in | Wt 170.0 lb

## 2014-11-10 DIAGNOSIS — E119 Type 2 diabetes mellitus without complications: Secondary | ICD-10-CM | POA: Diagnosis not present

## 2014-11-10 DIAGNOSIS — K140 Glossitis: Secondary | ICD-10-CM

## 2014-11-10 DIAGNOSIS — E538 Deficiency of other specified B group vitamins: Secondary | ICD-10-CM

## 2014-11-10 DIAGNOSIS — E785 Hyperlipidemia, unspecified: Secondary | ICD-10-CM | POA: Diagnosis not present

## 2014-11-10 DIAGNOSIS — D7589 Other specified diseases of blood and blood-forming organs: Secondary | ICD-10-CM | POA: Diagnosis not present

## 2014-11-10 DIAGNOSIS — I10 Essential (primary) hypertension: Secondary | ICD-10-CM | POA: Diagnosis not present

## 2014-11-10 DIAGNOSIS — R000 Tachycardia, unspecified: Secondary | ICD-10-CM

## 2014-11-10 LAB — POCT CBC
GRANULOCYTE PERCENT: 58 % (ref 37–80)
HEMATOCRIT: 43.1 % — AB (ref 43.5–53.7)
Hemoglobin: 14.2 g/dL (ref 14.1–18.1)
Lymph, poc: 2 (ref 0.6–3.4)
MCH, POC: 36.6 pg — AB (ref 27–31.2)
MCHC: 33.1 g/dL (ref 31.8–35.4)
MCV: 110.7 fL — AB (ref 80–97)
MID (cbc): 0.2 (ref 0–0.9)
MPV: 8.1 fL (ref 0–99.8)
PLATELET COUNT, POC: 290 10*3/uL (ref 142–424)
POC Granulocyte: 3 (ref 2–6.9)
POC LYMPH %: 38.4 % (ref 10–50)
POC MID %: 3.6 %M (ref 0–12)
RBC: 3.89 M/uL — AB (ref 4.69–6.13)
RDW, POC: 16.7 %
WBC: 5.1 10*3/uL (ref 4.6–10.2)

## 2014-11-10 LAB — POCT GLYCOSYLATED HEMOGLOBIN (HGB A1C): Hemoglobin A1C: 5.6

## 2014-11-10 MED ORDER — LISINOPRIL 10 MG PO TABS: 10.0000 mg | ORAL_TABLET | Freq: Every day | ORAL | Status: DC

## 2014-11-10 MED ORDER — FLUCONAZOLE 100 MG PO TABS: ORAL_TABLET | ORAL | Status: DC

## 2014-11-10 NOTE — Progress Notes (Signed)
Subjective:    Patient ID: Gregory Collins, male    DOB: May 18, 1971, 44 y.o.   MRN: [id number]  This chart was scribed for Leandrew Koyanagi, MD by Stephania Fragmin, ED Scribe. This patient was seen in room 1 and the patient's care was started at 3:21 PM.   HPI   Chief Complaint  Patient presents with  . Oral Pain    X 4-5 days   --follow-up of metabolic issues  HPI Comments: Gregory Collins is a 44 y.o. male who presents to the [facility].  Patient has lost weight through diet and exercise, down to 170 lbs today after being 198 lbs 4 months ago, but his blood pressure is still high. He states that 1 month ago, it was 134/88. He notes difficulty sleeping sometimes, as he will occasionally sleep at midnight or 1 AM and wake up at 6 AM. Patient has 2 young children, ages 89 and 73, who he spends time with after he comes home from work. Patient's mother and father both have a history DM, HTN, and hyperlipidemia.   Note that he was started on metformin and amlodipine in January by Dr. Sanjuana Letters had progressive intolerance to  glucose over the past year.  He is complaining of an irritated tongue that and gums which are red and splotchy--this is been present off and on since January--- he has a past history of being treated for thrush last year and this is what it felt like.   BP Readings from Last 3 Encounters:  11/10/14 160/86  07/14/14 156/82  05/26/14 140/88     Patient still takes Amlodipine and glucophage and occasionally uses the steroid inhaler. He uses Xanax as needed for use when flying. Patient still has GERD. He denies having headaches. He has had hyperlipidemia in the past but was not treated except by diet which he never paid attention to until recently. GERD stable with occasional omeprazole or Prevacid. Reactive airway disease stable with occasional Qvar/albuterol.  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   Review of Systems  Constitutional: Negative for  fatigue.  HENT: Negative for trouble swallowing.   Eyes: Negative for visual disturbance.  Respiratory: Negative for shortness of breath.   Cardiovascular: Negative for chest pain, palpitations and leg swelling.  Endocrine: Negative for polyuria.  Genitourinary: Negative for frequency and difficulty urinating.  Neurological: Negative for dizziness, weakness and headaches.  Psychiatric/Behavioral: Negative for dysphoric mood and decreased concentration.       His difficulty with sleeping is due to anxiety. His mind races. He has no snoring or sleep apnea symptoms and he does not have daytime hypersomnolence. He has a history of alprazolam use for airplane flying or occasional panic attacks       Objective:   Physical Exam  Constitutional: He is oriented to person, place, and time. He appears well-developed and well-nourished. No distress.  HENT:  He has a G geographic tongue is beefy red and coated white with similar changes on the buccal mucosa  Eyes: Conjunctivae and EOM are normal. Pupils are equal, round, and reactive to light.  Neck: Neck supple. No thyromegaly present.  Cardiovascular: Normal rate, regular rhythm, normal heart sounds and intact distal pulses.   No murmur heard. Pulmonary/Chest: Effort normal and breath sounds normal.  Abdominal: Soft. Bowel sounds are normal. He exhibits no distension and no mass. There is no tenderness. There is no rebound and no guarding.  Musculoskeletal: He exhibits no edema.  Lymphadenopathy:    He has no cervical adenopathy.  Neurological: He is alert and oriented to person, place, and time. No cranial nerve deficit.  No sensory losses lower extremities  Psychiatric: He has a normal mood and affect. His behavior is normal. Judgment and thought content normal.  Nursing note and vitals reviewed.  BP 144/80 mmHg( 168 at intake)  Pulse 92(110 at intake)  Temp(Src) 97.9 F (36.6 C) (Oral)  Resp 16  Ht 5' 8.5" (1.74 m)  Wt 170 lb (77.111  kg)  BMI 25.47 kg/m2  SpO2 97%   Lab Results  Component Value Date   HGBA1C 5.6 11/10/2014       Assessment & Plan:  Diabetes mellitus without complication  - Plan: With his A1c now normal he will discontinue metformin continued weight loss through diet and exercise and return for recheck in 3 months. Very likely he will need a low dose of medicine.  Hyperlipidemia - Plan: Lipid panel and start atorvastatin after results  Essential hypertension - Plan: POCT CBC, Comprehensive metabolic panel, TSH and discontinue Norvasc starting lisinopril as a more appropriate antihypertensive in diabetes  Tachycardia - Plan: TSH  Macrocytosis without anemia - Plan: Folate, Vitamin B12,  ( no alcohol)  Glossitis - Plan:  B12 and Folate Panel Current head and treat with Diflucan as this is likely thrush    Meds ordered this encounter  Medications  . DISCONTD: amLODipine (NORVASC) 2.5 MG tablet    Sig: Take 2.5 mg by mouth daily.  Marland Kitchen lisinopril (PRINIVIL,ZESTRIL) 10 MG tablet    Sig: Take 1 tablet (10 mg total) by mouth daily.    Dispense:  90 tablet    Refill:  3  . fluconazole (DIFLUCAN) 100 MG tablet    Sig: 2 tabs today then one daily    Dispense:  15 tablet    Refill:  0   I have completed the patient encounter in its entirety as documented by the scribe, with editing by me where necessary. Linder Prajapati P. Laney Pastor, M.D.

## 2014-11-11 LAB — COMPREHENSIVE METABOLIC PANEL
ALT: 15 U/L (ref 0–53)
AST: 15 U/L (ref 0–37)
Albumin: 4.7 g/dL (ref 3.5–5.2)
Alkaline Phosphatase: 57 U/L (ref 39–117)
BUN: 10 mg/dL (ref 6–23)
CALCIUM: 9 mg/dL (ref 8.4–10.5)
CHLORIDE: 101 meq/L (ref 96–112)
CO2: 26 meq/L (ref 19–32)
Creat: 0.97 mg/dL (ref 0.50–1.35)
Glucose, Bld: 104 mg/dL — ABNORMAL HIGH (ref 70–99)
POTASSIUM: 4.1 meq/L (ref 3.5–5.3)
SODIUM: 138 meq/L (ref 135–145)
TOTAL PROTEIN: 7.6 g/dL (ref 6.0–8.3)
Total Bilirubin: 0.5 mg/dL (ref 0.2–1.2)

## 2014-11-11 LAB — TSH: TSH: 1.03 u[IU]/mL (ref 0.350–4.500)

## 2014-11-11 LAB — LIPID PANEL
CHOLESTEROL: 218 mg/dL — AB (ref 0–200)
HDL: 38 mg/dL — ABNORMAL LOW (ref >=40)
LDL Cholesterol: 147 mg/dL — ABNORMAL HIGH (ref 0–99)
Total CHOL/HDL Ratio: 5.7 Ratio
Triglycerides: 163 mg/dL — ABNORMAL HIGH (ref <150)
VLDL: 33 mg/dL (ref 0–40)

## 2014-11-11 LAB — FOLATE: FOLATE: 19.1 ng/mL

## 2014-11-11 LAB — MICROALBUMIN, URINE: MICROALB UR: 0.6 mg/dL (ref <2.0)

## 2014-11-11 LAB — VITAMIN B12: VITAMIN B 12: 117 pg/mL — AB (ref 211–911)

## 2014-11-12 ENCOUNTER — Encounter: Payer: Self-pay | Admitting: Internal Medicine

## 2014-11-12 ENCOUNTER — Telehealth: Payer: Self-pay | Admitting: Internal Medicine

## 2014-11-12 DIAGNOSIS — E538 Deficiency of other specified B group vitamins: Secondary | ICD-10-CM

## 2014-11-12 DIAGNOSIS — E785 Hyperlipidemia, unspecified: Secondary | ICD-10-CM

## 2014-11-12 LAB — HOMOCYSTEINE: HOMOCYSTEINE: 123.7 umol/L — AB (ref 4.0–15.4)

## 2014-11-12 MED ORDER — ATORVASTATIN CALCIUM 20 MG PO TABS: 20.0000 mg | ORAL_TABLET | Freq: Every day | ORAL | Status: DC

## 2014-11-12 NOTE — Telephone Encounter (Signed)
Labs LDL 14----add lipit 20 B12<200 MMA and Homocy then treat

## 2014-11-17 LAB — METHYLMALONIC ACID, SERUM: METHYLMALONIC ACID, QUANT: 9700 nmol/L — AB (ref 87–318)

## 2014-11-17 NOTE — Addendum Note (Signed)
Addended by: Leandrew Koyanagi on: 11/17/2014 07:28 PM   Modules accepted: Orders, Medications

## 2014-11-18 ENCOUNTER — Encounter: Payer: Self-pay | Admitting: Internal Medicine

## 2015-01-25 ENCOUNTER — Other Ambulatory Visit (INDEPENDENT_AMBULATORY_CARE_PROVIDER_SITE_OTHER): Payer: BC Managed Care – PPO

## 2015-01-25 ENCOUNTER — Encounter: Payer: Self-pay | Admitting: Internal Medicine

## 2015-01-25 ENCOUNTER — Ambulatory Visit (INDEPENDENT_AMBULATORY_CARE_PROVIDER_SITE_OTHER): Payer: BC Managed Care – PPO | Admitting: Internal Medicine

## 2015-01-25 VITALS — BP 130/70 | HR 82 | Ht 67.0 in | Wt 166.4 lb

## 2015-01-25 DIAGNOSIS — E538 Deficiency of other specified B group vitamins: Secondary | ICD-10-CM | POA: Diagnosis not present

## 2015-01-25 DIAGNOSIS — Z1211 Encounter for screening for malignant neoplasm of colon: Secondary | ICD-10-CM

## 2015-01-25 DIAGNOSIS — K219 Gastro-esophageal reflux disease without esophagitis: Secondary | ICD-10-CM

## 2015-01-25 LAB — CBC
HCT: 47.3 % (ref 39.0–52.0)
HEMOGLOBIN: 15.7 g/dL (ref 13.0–17.0)
MCHC: 33.3 g/dL (ref 30.0–36.0)
MCV: 96.1 fl (ref 78.0–100.0)
Platelets: 245 10*3/uL (ref 150.0–400.0)
RBC: 4.92 Mil/uL (ref 4.22–5.81)
RDW: 17.9 % — ABNORMAL HIGH (ref 11.5–15.5)
WBC: 5.2 10*3/uL (ref 4.0–10.5)

## 2015-01-25 MED ORDER — CYANOCOBALAMIN 1000 MCG/ML IJ SOLN: 1000.0000 ug | Freq: Once | INTRAMUSCULAR | Status: DC

## 2015-01-25 NOTE — Addendum Note (Signed)
Addended by: Oda Kilts on: 01/25/2015 09:46 AM   Modules accepted: Orders

## 2015-01-25 NOTE — Progress Notes (Signed)
Patient ID: Gregory Collins, male   DOB: Dec 22, 1970, 44 y.o.   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 Laney Pastor to evaluate B12 deficiency. He is here alone today. He was seen by Dr. Sharlett Iles for years ago to evaluate GERD symptoms and at that time was given Dexilant. He had a remote upper endoscopy performed by Dr. Sharlett Iles. He is now taking lansoprazole 30 mg twice daily which she states is controlling reflux disease. He has rare heartburn. He denies dysphagia and odynophagia. No nausea or vomiting. Good appetite. No early satiety. He has a 20 pound intentional weight loss after having elevated glucose in the diagnosis of diabetes by primary care. He does have a history of intermittent loose stools which she says depends on diet. This is more common if he eats spicy foods or foods with a lot of fiber such as salads. He has been working out at [employer] and also avoiding high fat foods in an effort to lose weight. He denies extreme fatigue, fevers, chills and night sweats. 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 He is now drinking much more rarely.  He does take lisinopril for blood pressure and atorvastatin for hyperlipidemia. He is taking Qvar for history of asthma.  Past Medical History  Diagnosis Date  . Asthma   . GERD (gastroesophageal reflux disease)   . Diabetes mellitus without complication   . Hypertension   . Hyperlipidemia     Past Surgical History  Procedure Laterality Date  . Leg surgery      infant    Outpatient Prescriptions Prior to Visit  Medication Sig Dispense Refill  . atorvastatin  (LIPITOR) 20 MG tablet Take 1 tablet (20 mg total) by mouth daily. 90 tablet 3  . beclomethasone (QVAR) 40 MCG/ACT inhaler Inhale into the lungs 2 (two) times daily.    . lansoprazole (PREVACID) 30 MG capsule Take 30 mg by mouth 2 (two) times daily.  5  . lisinopril (PRINIVIL,ZESTRIL) 10 MG tablet Take 1 tablet (10 mg total) by mouth daily. 90 tablet 3  . PROAIR HFA 108 (90 BASE) MCG/ACT inhaler   6  . ALPRAZolam (XANAX) 1 MG tablet Take 1 tablet (1 mg total) by mouth daily as needed for anxiety. (Patient not taking: Reported on 07/14/2014) 10 tablet 0  . Diphenhyd-Hydrocort-Nystatin (FIRST-DUKES MOUTHWASH) SUSP 1 teaspoon as rinse gargle with 1:1 viscous lidocaine and spit 4 times a day prn (Patient not taking: Reported on 07/14/2014) 120 mL 0  . fluconazole (DIFLUCAN) 100 MG tablet 2 tabs today then one daily 15 tablet 0  . hydrOXYzine (ATARAX/VISTARIL) 25 MG tablet Take 1 tablet (25 mg total) by mouth 3 (three) times daily as needed for itching. (Patient not taking: Reported on 07/14/2014) 30 tablet 0  . metFORMIN (GLUCOPHAGE) 500 MG tablet Take 1 tablet (500 mg total) by mouth daily with breakfast. 90 tablet 3  . omeprazole (PRILOSEC) 10 MG capsule Take 10 mg by mouth as needed.    Marland Kitchen UNABLE TO FIND Med Name: Pro Air HFA 90 MCG     No facility-administered medications prior  to visit.    No Known Allergies  Family History  Problem Relation Age of Onset  . Diabetes Mother   . Kidney disease Mother   . Diabetes Father   . Prostate cancer Father     History  Substance Use Topics  . Smoking status: Never Smoker   . Smokeless tobacco: Never Used  . Alcohol Use: 0.0 oz/week    0 Standard drinks or equivalent per week     Comment: social    ROS: As per history of present illness, otherwise negative  BP 130/70 mmHg  Pulse 82  Ht 5\' 7"  (1.702 m)  Wt 166 lb 6 oz (75.467 kg)  BMI 26.05 kg/m2 Constitutional: Well-developed and well-nourished. No distress. HEENT: Normocephalic and  atraumatic. Oropharynx is clear and moist. No oropharyngeal exudate. Conjunctivae are normal.  No scleral icterus. Neck: Neck supple. Trachea midline. Cardiovascular: Normal rate, regular rhythm and intact distal pulses. No M/R/G Pulmonary/chest: Effort normal and breath sounds normal. No wheezing, rales or rhonchi. Abdominal: Soft, nontender, nondistended. Bowel sounds active throughout. There are no masses palpable. No hepatosplenomegaly. Extremities: no clubbing, cyanosis, or edema Lymphadenopathy: No cervical adenopathy noted. Neurological: Alert and oriented to person place and time. Skin: Skin is warm and dry. No rashes noted. Psychiatric: Normal mood and affect. Behavior is normal.  RELEVANT LABS AND IMAGING: CBC    Component Value Date/Time   WBC 5.1 11/10/2014 1549   WBC 5.2 11/12/2013 1623   RBC 3.89* 11/10/2014 1549   RBC 4.40 11/12/2013 1623   HGB 14.2 11/10/2014 1549   HGB 14.8 11/12/2013 1623   HCT 43.1* 11/10/2014 1549   HCT 41.8 11/12/2013 1623   PLT 315 11/12/2013 1623   MCV 110.7* 11/10/2014 1549   MCV 95.0 11/12/2013 1623   MCH 36.6* 11/10/2014 1549   MCH 33.6 11/12/2013 1623   MCHC 33.1 11/10/2014 1549   MCHC 35.4 11/12/2013 1623   RDW 14.5 11/12/2013 1623    CMP     Component Value Date/Time   NA 138 11/10/2014 1532   K 4.1 11/10/2014 1532   CL 101 11/10/2014 1532   CO2 26 11/10/2014 1532   GLUCOSE 104* 11/10/2014 1532   BUN 10 11/10/2014 1532   CREATININE 0.97 11/10/2014 1532   CALCIUM 9.0 11/10/2014 1532   PROT 7.6 11/10/2014 1532   ALBUMIN 4.7 11/10/2014 1532   AST 15 11/10/2014 1532   ALT 15 11/10/2014 1532   ALKPHOS 57 11/10/2014 1532   BILITOT 0.5 11/10/2014 1532   GFRNONAA >89 11/12/2013 1623   GFRAA >89 11/12/2013 1623   Lab Results  Component Value Date   VITAMINB12 117* 11/10/2014  --MMA and homocysteine very elevated   ASSESSMENT/PLAN: 44 yo male with PMH of GERD, dyspeptic symptoms, HTN, HL, DM who is seen in consultation at  the request of Dr. Laney Pastor to evaluate B12 deficiency.  1. B12 def -- labs consistent with B12 deficiency along with a macrocytosis. He does not have an anemia. Folate levels were normal. We discussed etiology which can include PPI use, gastritis, autoimmune atrophic gastritis, pancreatic insufficiency and IBD. He does not have loose stools or diarrhea on a frequent basis making a chronic insufficiency unlikely. No abdominal symptoms to make me suspect Crohn's disease. I have recommended upper endoscopy to evaluate for gastritis. I've also recommended parietal cell antibody and intrinsic factor antibody testing today. We need to start IM B12 and will plan 1000 g IM every 2 weeks 8 weeks then monthly thereafter. He feels that  either he or his wife can self administer B12 but he will come here for a nurse's visit to be instructed on how to perform the injection appropriately and to be sure they are comfortable. We discussed the risks, benefits and alternatives to endoscopy and he agrees to proceed  2. CRC screening -- colonoscopy recommended in December 2018 at which time he'll be 44 years old for screening  3. GERD -- controlled, continue lansoprazole 30 mg twice a day, see #43    YO:YOOJZB Burtis Junes, [address] [place], [place] [id number]

## 2015-01-25 NOTE — Patient Instructions (Addendum)
You have been scheduled for an endoscopy. Please follow written instructions given to you at your visit today. If you use inhalers (even only as needed), please bring them with you on the day of your procedure. Your physician has requested that you go to www.startemmi.com and enter the access code given to you at your visit today. This web site gives a general overview about your procedure. However, you should still follow specific instructions given to you by our office regarding your preparation for the procedure.  Your B12 teaching is scheduled on Friday 01/29/2015 at [place] will give yourself one injection every 2 weeks for 8 weeks then every month thereafter Go to the basement for labs today

## 2015-01-26 ENCOUNTER — Encounter: Payer: BC Managed Care – PPO | Admitting: Internal Medicine

## 2015-01-26 LAB — INTRINSIC FACTOR ANTIBODIES: Intrinsic Factor: POSITIVE — AB

## 2015-01-27 ENCOUNTER — Ambulatory Visit (INDEPENDENT_AMBULATORY_CARE_PROVIDER_SITE_OTHER): Payer: BC Managed Care – PPO | Admitting: Internal Medicine

## 2015-01-27 DIAGNOSIS — E538 Deficiency of other specified B group vitamins: Secondary | ICD-10-CM

## 2015-01-27 MED ORDER — CYANOCOBALAMIN 1000 MCG/ML IJ SOLN
1000.0000 ug | Freq: Once | INTRAMUSCULAR | Status: AC
  Administered 2015-01-27: 1000 ug via INTRAMUSCULAR

## 2015-01-28 LAB — ANTI-PARIETAL ANTIBODY: Parietal Cell Antibody-IgG: NEGATIVE

## 2015-02-12 ENCOUNTER — Telehealth: Payer: Self-pay | Admitting: Internal Medicine

## 2015-02-12 ENCOUNTER — Other Ambulatory Visit: Payer: Self-pay

## 2015-02-12 ENCOUNTER — Ambulatory Visit (INDEPENDENT_AMBULATORY_CARE_PROVIDER_SITE_OTHER): Payer: BC Managed Care – PPO | Admitting: Internal Medicine

## 2015-02-12 DIAGNOSIS — E538 Deficiency of other specified B group vitamins: Secondary | ICD-10-CM

## 2015-02-12 MED ORDER — CYANOCOBALAMIN 1000 MCG/ML IJ SOLN
1000.0000 ug | Freq: Once | INTRAMUSCULAR | Status: AC
Start: 2015-02-12 — End: 2015-02-12
  Administered 2015-02-12: 1000 ug via INTRAMUSCULAR

## 2015-02-12 NOTE — Progress Notes (Signed)
Patient feels more comfortable coming here for his B12 injections so another appointment set up for 02/26/15 at 8:30AM.

## 2015-02-12 NOTE — Telephone Encounter (Signed)
Pt scheduled to come in today for B12 injection at 2pm. Pt aware of appt.

## 2015-02-17 ENCOUNTER — Encounter: Payer: Self-pay | Admitting: Gastroenterology

## 2015-02-24 ENCOUNTER — Encounter: Payer: Self-pay | Admitting: Internal Medicine

## 2015-02-24 ENCOUNTER — Ambulatory Visit (INDEPENDENT_AMBULATORY_CARE_PROVIDER_SITE_OTHER): Payer: BC Managed Care – PPO | Admitting: Internal Medicine

## 2015-02-24 VITALS — BP 140/100 | HR 111 | Temp 98.3°F | Resp 16 | Ht 67.25 in | Wt 166.2 lb

## 2015-02-24 DIAGNOSIS — I471 Supraventricular tachycardia: Secondary | ICD-10-CM

## 2015-02-24 DIAGNOSIS — I10 Essential (primary) hypertension: Secondary | ICD-10-CM

## 2015-02-24 DIAGNOSIS — E785 Hyperlipidemia, unspecified: Secondary | ICD-10-CM | POA: Diagnosis not present

## 2015-02-24 DIAGNOSIS — E119 Type 2 diabetes mellitus without complications: Secondary | ICD-10-CM | POA: Diagnosis not present

## 2015-02-24 DIAGNOSIS — R002 Palpitations: Secondary | ICD-10-CM | POA: Diagnosis not present

## 2015-02-24 DIAGNOSIS — R000 Tachycardia, unspecified: Secondary | ICD-10-CM | POA: Diagnosis not present

## 2015-02-24 LAB — POCT GLYCOSYLATED HEMOGLOBIN (HGB A1C): Hemoglobin A1C: 5.9

## 2015-02-24 NOTE — Progress Notes (Signed)
   Subjective:    Patient ID: Gregory Collins, male    DOB: 29-Dec-1970, 44 y.o.   MRN: [id number]  HPIf/u Problem #1 DM2 Medication was stopped at his last visit because of a normal A1c and significant weight loss and today we will establish whether he can maintain this A1c off medication  Problem #2 recently discovered B12 deficiency--See ref to Dr Hilarie Fredrickson A1c care unit but she said I also need an EKG that there to assisted and I added lipids there in their He is now on biweekly injections of B12 with working diagnosis of pernicious anemia  Problem #3 hyperlipidemia He is now been on Lipitor 20 mg per 3 months it's time for reassessment  Problem #4 tachycardia He has intermittent problems with palpitations but only occasionally has associated symptoms of lightheadedness or chest tightness and he certainly has no dyspnea on exertion or angina. He is never had a cardiac evaluation. He has no family history of heart disease. These episodes of rapid heartbeat tend to occur any time, including sitting at a computer while working, and are nonexertional in nature.  Did not take lisin today  Review of Systems  Constitutional: Negative for fever, chills, activity change, fatigue and unexpected weight change.  Eyes: Negative for photophobia and visual disturbance.  Respiratory: Negative for shortness of breath.        No need for albuterol recently  Cardiovascular: Positive for palpitations. Negative for chest pain and leg swelling.  Gastrointestinal: Negative for abdominal pain and diarrhea.  Endocrine: Negative for polyuria.  Genitourinary: Negative for difficulty urinating.  Neurological: Negative for dizziness and headaches.  Hematological: Does not bruise/bleed easily.  Psychiatric/Behavioral: Negative for confusion and sleep disturbance.       Objective:   Physical Exam BP 140/100 mmHg  Pulse 111  Temp(Src) 98.3 F (36.8 C) (Oral)  Resp 16  Ht 5' 7.25" (1.708 m)  Wt 166 lb 3.2  oz (75.388 kg)  BMI 25.84 kg/m2  SpO2 96% Did not take medication for blood pressure this morning Reassessment of pulse equal 90. No thyromegaly PERRLA and EOMs conjugate Heart regular without murmur or click Lungs clear No peripheral edema Good peripheral pulses Diabetic Foot Exam - Simple   Simple Foot Form  Diabetic Foot exam was performed with the following findings:  Yes 02/24/2015 11:09 AM  Visual Inspection  Sensation Testing  Pulse Check  Comments--- normal exam   Pedal pulses felt on exam     Cranial nerves II through XII intact Mood and affect appropriate  Results for orders placed or performed in visit on 02/24/15  POCT glycosylated hemoglobin (Hb A1C)  Result Value Ref Range   Hemoglobin A1C 5.9    Ekg=rate 99 sinus w/out electr abnormal     Assessment & Plan:  Diabetes mellitus without complication - Plan: now diet controlled!!!!! 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

## 2015-02-25 ENCOUNTER — Encounter: Payer: Self-pay | Admitting: Internal Medicine

## 2015-02-25 LAB — LIPID PANEL
CHOL/HDL RATIO: 4.4 ratio (ref <=5.0)
Cholesterol: 196 mg/dL (ref 125–200)
HDL: 45 mg/dL (ref >=40)
LDL Cholesterol: 123 mg/dL (ref <130)
Triglycerides: 140 mg/dL (ref <150)
VLDL: 28 mg/dL (ref <30)

## 2015-02-26 ENCOUNTER — Ambulatory Visit (INDEPENDENT_AMBULATORY_CARE_PROVIDER_SITE_OTHER): Payer: BC Managed Care – PPO | Admitting: Internal Medicine

## 2015-02-26 DIAGNOSIS — E538 Deficiency of other specified B group vitamins: Secondary | ICD-10-CM

## 2015-02-26 MED ORDER — CYANOCOBALAMIN 1000 MCG/ML IJ SOLN: INTRAMUSCULAR | Status: DC

## 2015-02-26 MED ORDER — CYANOCOBALAMIN 1000 MCG/ML IJ SOLN: 1000.0000 ug | Freq: Once | INTRAMUSCULAR | Status: DC

## 2015-02-26 MED ORDER — CYANOCOBALAMIN 1000 MCG/ML IJ SOLN
1000.0000 ug | INTRAMUSCULAR | Status: DC
  Administered 2015-02-26 – 2015-03-26 (×3): 1000 ug via INTRAMUSCULAR

## 2015-03-03 ENCOUNTER — Telehealth: Payer: Self-pay | Admitting: Internal Medicine

## 2015-03-03 ENCOUNTER — Ambulatory Visit (INDEPENDENT_AMBULATORY_CARE_PROVIDER_SITE_OTHER): Payer: BC Managed Care – PPO | Admitting: Family Medicine

## 2015-03-03 VITALS — BP 148/80 | HR 115 | Temp 98.1°F | Resp 16 | Ht 68.0 in | Wt 169.6 lb

## 2015-03-03 DIAGNOSIS — I10 Essential (primary) hypertension: Secondary | ICD-10-CM | POA: Diagnosis not present

## 2015-03-03 DIAGNOSIS — R51 Headache: Secondary | ICD-10-CM

## 2015-03-03 DIAGNOSIS — R002 Palpitations: Secondary | ICD-10-CM

## 2015-03-03 DIAGNOSIS — R519 Headache, unspecified: Secondary | ICD-10-CM

## 2015-03-03 MED ORDER — LISINOPRIL-HYDROCHLOROTHIAZIDE 10-12.5 MG PO TABS: 1.0000 | ORAL_TABLET | Freq: Every day | ORAL | Status: DC

## 2015-03-03 NOTE — Telephone Encounter (Signed)
PT called concerning elevated BP and recent medication changes. PT is concerned that Rx may need to be adjusted please contact to advise further at [phone].   [phone]

## 2015-03-03 NOTE — Progress Notes (Signed)
Subjective:  This chart was scribed for Gregory Ray, MD by Thea Alken, ED Scribe. This patient was seen in room 10 and the patient's care was started at 1:38 PM.  Patient ID: Gregory Collins, male    DOB: 03/23/1971, 44 y.o.   MRN: [id number]  HPI   Chief Complaint  Patient presents with  . Headache    x 2 days   . Medication Problem    pt. concern about med. not working Lisinopril 10 mg   HPI Comments: Gregory Collins is a 44 y.o. male who presents to the [facility] complaining of a HA and HTN. Hx of DM, HTN, GERD, hyperlipidemia. PCP is Dr. Romilda Garret with last visit 8/17. Was noted to have intermittent palpitation with diagnosis of PSVT. Was referred to cardiology, appt 9/19 at [facility] [facility] . EKG 8/17 sinus rhythm rate 98. His BP was elevated at last visit at 140/100. Reccommended checking BP at home on 10 mg lisinopril.  Pt reports Home BP reading have been running a little high in the range of 135-144/80-90. Pt was on Norvasc, but was discontinued in May and changed to Lisinopril 10 mg. Pt reports intermittent HA when his BP is elevated. He has occasional palpitation that tend go away on its own. He denies nausea, emesis, light headedness and dizziness.  Pt's last a1c was 5.9. He has been off metformin since May and is controlling DM with diet and exercise.   Patient Active Problem List   Diagnosis Date Noted  . B12 deficiency 11/12/2014  . Diabetes mellitus without complication 83/38/2505  . Hyperlipidemia 11/10/2014  . Essential hypertension 11/10/2014  . Tonsillar debris 06/13/2013  . Migraine 08/20/2011  . Reflux 08/20/2011  . GERD (gastroesophageal reflux disease) 06/20/2011  . 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 Asthma   . GERD (gastroesophageal reflux disease)   . Diabetes mellitus without complication   . Hypertension   . Hyperlipidemia    Past Surgical History  Procedure Laterality Date  . Leg  surgery      infant   No Known Allergies Prior to Admission medications   Medication Sig Start Date End Date Taking? Authorizing Provider  atorvastatin (LIPITOR) 20 MG tablet Take 1 tablet (20 mg total) by mouth daily. 11/12/14  Yes Leandrew Koyanagi, MD  beclomethasone (QVAR) 40 MCG/ACT inhaler Inhale into the lungs 2 (two) times daily.   Yes Historical Provider, MD  cyanocobalamin (,VITAMIN B-12,) 1000 MCG/ML injection As directed 02/26/15  Yes Jerene Bears, MD  lansoprazole (PREVACID) 30 MG capsule Take 30 mg by mouth 2 (two) times daily. 09/21/14  Yes Historical Provider, MD  lisinopril (PRINIVIL,ZESTRIL) 10 MG tablet Take 1 tablet (10 mg total) by mouth daily. 11/10/14  Yes Leandrew Koyanagi, MD  PROAIR HFA 108 [phone] BASE) MCG/ACT inhaler  10/14/14  Yes Historical Provider, MD   Social History   Social History  . Marital Status: Married    Spouse Name: N/A  . Number of Children: 2  . 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 Smoking status: Never Smoker   . Smokeless tobacco: Never Used  . Alcohol Use: 0.0 oz/week    0 Standard drinks or equivalent per week     Comment: social  . Drug Use: No  . Sexual Activity: Yes    Birth Control/ Protection: None   Other Topics Concern  .  Not on file   Social History Narrative   ** Merged History Encounter **       Review of Systems  Constitutional: Negative for fatigue and unexpected weight change.  Eyes: Negative for visual disturbance.  Respiratory: Negative for cough, chest tightness and shortness of breath.   Cardiovascular: Positive for palpitations. Negative for chest pain and leg swelling.  Gastrointestinal: Negative for nausea, vomiting, abdominal pain and blood in stool.  Neurological: Positive for headaches. Negative for dizziness, speech difficulty and light-headedness.   Objective:   Physical Exam  Constitutional: He is oriented to person, place, and time. He  appears well-developed and well-nourished.  HENT:  Head: Normocephalic and atraumatic.  Eyes: EOM are normal. Pupils are equal, round, and reactive to light.  Neck: No JVD present. Carotid bruit is not present.  Cardiovascular: Normal rate, regular rhythm and normal heart sounds.   No murmur heard. Pulmonary/Chest: Effort normal and breath sounds normal. He has no rales.  Musculoskeletal: He exhibits no edema.  Neurological: He is alert and oriented to person, place, and time. No cranial nerve deficit.  Non focal neuro exam  Skin: Skin is warm and dry.  Psychiatric: He has a normal mood and affect.  Vitals reviewed.  Filed Vitals:   03/03/15 1309  BP: 148/80  Pulse: 115  Temp: 98.1 F (36.7 C)  TempSrc: Oral  Resp: 16  Height: 5\' 8"  (1.727 m)  Weight: 169 lb 9.6 oz (76.93 kg)  SpO2: 98%    Assessment & Plan:   Gregory Collins is a 44 y.o. male Essential hypertension - Plan: lisinopril-hydrochlorothiazide (PRINZIDE,ZESTORETIC) 10-12.5 MG per tablet  -Borderline home readings, and overall  borderline controlled by JNC 8 guidelines, decided on trial of adding HCTZ for improved control with a goal of 130/80 with his history of diabetes and KDOQI guidelines.  -Orthostatic precautions discussed. Plan on follow-up with primary care provider for reassessment of blood pressure next 3 months.  Nonintractable episodic headache, unspecified headache type  -May be related to hypertension. If these are not improved once he has improved blood pressure control, return to clinic for further evaluation.   Palpitations  -Intermittent, without symptoms of lightheadedness dizziness or chest pain. He has cardiology follow-up pending. RTC/ER precautions.  Meds ordered this encounter  Medications  . lisinopril-hydrochlorothiazide (PRINZIDE,ZESTORETIC) 10-12.5 MG per tablet    Sig: Take 1 tablet by mouth daily.    Dispense:  30 tablet    Refill:  2   Patient Instructions  Okay to change blood  pressure medicine to lisinopril/HCTZ 10/12.5 mg once per day for a goal of under 130/80 as discussed. watch for any signs of low blood pressure with this new dose, such as lightheadedness or dizziness when first standing up in the morning or when you are getting up from a seated position. Follow-up in the next 2-3 months to recheck blood pressure. Sooner if needed.  Follow-up with cardiology as planned for your palpitations. If you get lightheaded dizzy or any chest pain with these symptoms, return here or the emergency room.  If the headaches do not improve as her blood pressure is under better control, follow-up and we can discuss other causes of headaches.  Return to the clinic or go to the nearest emergency room if any of your symptoms worsen or new symptoms occur.

## 2015-03-03 NOTE — Patient Instructions (Addendum)
Okay to change blood pressure medicine to lisinopril/HCTZ 10/12.5 mg once per day for a goal of under 130/80 as discussed. watch for any signs of low blood pressure with this new dose, such as lightheadedness or dizziness when first standing up in the morning or when you are getting up from a seated position. Follow-up in the next 2-3 months to recheck blood pressure. Sooner if needed.  Follow-up with cardiology as planned for your palpitations. If you get lightheaded dizzy or any chest pain with these symptoms, return here or the emergency room.  If the headaches do not improve as her blood pressure is under better control, follow-up and we can discuss other causes of headaches.  Return to the clinic or go to the nearest emergency room if any of your symptoms worsen or new symptoms occur.

## 2015-03-04 NOTE — Telephone Encounter (Signed)
Call to see what he needs

## 2015-03-04 NOTE — Telephone Encounter (Signed)
I saw patient yesterday in office. It appears his initial phone message was prior to his visit with me. Let me know if he has any other questions since the visit yesterday.

## 2015-03-04 NOTE — Telephone Encounter (Signed)
Dr. Greene, please advise.

## 2015-03-11 ENCOUNTER — Encounter: Payer: Self-pay | Admitting: Internal Medicine

## 2015-03-11 ENCOUNTER — Ambulatory Visit (AMBULATORY_SURGERY_CENTER): Payer: BC Managed Care – PPO | Admitting: Internal Medicine

## 2015-03-11 VITALS — BP 128/60 | HR 102 | Temp 98.3°F | Resp 17 | Ht 68.0 in | Wt 169.0 lb

## 2015-03-11 DIAGNOSIS — E538 Deficiency of other specified B group vitamins: Secondary | ICD-10-CM | POA: Diagnosis present

## 2015-03-11 DIAGNOSIS — K219 Gastro-esophageal reflux disease without esophagitis: Secondary | ICD-10-CM

## 2015-03-11 DIAGNOSIS — K2970 Gastritis, unspecified, without bleeding: Secondary | ICD-10-CM

## 2015-03-11 DIAGNOSIS — D510 Vitamin B12 deficiency anemia due to intrinsic factor deficiency: Secondary | ICD-10-CM | POA: Diagnosis not present

## 2015-03-11 DIAGNOSIS — K2990 Gastroduodenitis, unspecified, without bleeding: Secondary | ICD-10-CM

## 2015-03-11 DIAGNOSIS — K2940 Chronic atrophic gastritis without bleeding: Secondary | ICD-10-CM | POA: Diagnosis not present

## 2015-03-11 DIAGNOSIS — K317 Polyp of stomach and duodenum: Secondary | ICD-10-CM | POA: Diagnosis not present

## 2015-03-11 MED ORDER — SODIUM CHLORIDE 0.9 % IV SOLN: 500.0000 mL | INTRAVENOUS | Status: DC

## 2015-03-11 NOTE — Progress Notes (Signed)
Called to room to assist during endoscopic procedure.  Patient ID and intended procedure confirmed with present staff. Received instructions for my participation in the procedure from the performing physician.  

## 2015-03-11 NOTE — Progress Notes (Signed)
Report to PACU, RN, vss, BBS= Clear.  

## 2015-03-11 NOTE — Op Note (Signed)
[facility]  [address] [place], [address]   ENDOSCOPY PROCEDURE REPORT  PATIENT: Tres, Grzywacz  MR#: [id number] BIRTHDATE: 07/20/1970 , 43  yrs. old GENDER: male ENDOSCOPIST: Jerene Bears, MD REFERRED BY:  Tami Lin, M.D. 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 MEDICATIONS: Monitored anesthesia care and Propofol 330 mg IV TOPICAL ANESTHETIC: none  DESCRIPTION OF PROCEDURE: After the risks benefits and alternatives of the procedure were thoroughly explained, informed consent was obtained.  The LB [id number] endoscope was introduced through the mouth and advanced to the second portion of the duodenum , Without limitations.  The instrument was slowly withdrawn as the mucosa was fully examined.  ESOPHAGUS: The mucosa of the esophagus appeared normal.   Z-line regular.  STOMACH: A sessile polyp measuring 7 mm in size was found on the lesser curvature of the gastric antrum.  A polypectomy was performed with snare cautery.  The resection was complete and the polyp tissue was completely retrieved.   The mucosa of the stomach appeared otherwise normal.  Cold forcep biopsies were taken at the gastric body, antrum and angularis to evaluate for h.  pylori.  DUODENUM: The duodenal mucosa showed no abnormalities in the bulb and 2nd part of the duodenum. Retroflexed views revealed no abnormalities.     The scope was then withdrawn from the patient and the procedure completed.  COMPLICATIONS: There were no immediate complications.  ENDOSCOPIC IMPRESSION: 1.   The mucosa of the esophagus appeared normal 2.   Sessile polyp was found on the lesser curvature of the gastric antrum; polypectomy was performed 3.   The mucosa of the stomach appeared normal; multiple biopsies to exclude  gastritis 4.   The duodenal mucosa showed no abnormalities in the bulb and 2nd part of the duodenum  RECOMMENDATIONS: 1.  Await pathology results 2.  Continue B12 injections 1000 mcg month indefinitely 3.  Continue lansoprazole at current dose 4.  Repeat B12 after 3 months of supplementation  eSigned:  Jerene Bears, MD 03/11/2015 3:29 PM [id number] Laney Pastor, MD and The Patient

## 2015-03-11 NOTE — Patient Instructions (Signed)
Discharge instructions given. Biopsies taken. Resume previous medications. YOU HAD AN ENDOSCOPIC PROCEDURE TODAY AT THE [facility]:   Refer to the procedure report that was given to you for any specific questions about what was found during the examination.  If the procedure report does not answer your questions, please call your gastroenterologist to clarify.  If you requested that your care partner not be given the details of your procedure findings, then the procedure report has been included in a sealed envelope for you to review at your convenience later.  YOU SHOULD EXPECT: Some feelings of bloating in the abdomen. Passage of more gas than usual.  Walking can help get rid of the air that was put into your GI tract during the procedure and reduce the bloating. If you had a lower endoscopy (such as a colonoscopy or flexible sigmoidoscopy) you may notice spotting of blood in your stool or on the toilet paper. If you underwent a bowel prep for your procedure, you may not have a normal bowel movement for a few days.  Please Note:  You might notice some irritation and congestion in your nose or some drainage.  This is from the oxygen used during your procedure.  There is no need for concern and it should clear up in a day or so.  SYMPTOMS TO REPORT IMMEDIATELY:   Following upper endoscopy (EGD)  Vomiting of blood or coffee ground material  New chest pain or pain under the shoulder blades  Painful or persistently difficult swallowing  New shortness of breath  Fever of 100F or higher  Black, tarry-looking stools  For urgent or emergent issues, a gastroenterologist can be reached at any hour by calling [phone].   DIET: Your first meal following the procedure should be a small meal and then it is ok to progress to your normal diet. Heavy or fried foods are harder to digest and may make you feel nauseous or bloated.  Likewise, meals heavy in dairy and vegetables can increase  bloating.  Drink plenty of fluids but you should avoid alcoholic beverages for 24 hours.  ACTIVITY:  You should plan to take it easy for the rest of today and you should NOT DRIVE or use heavy machinery until tomorrow (because of the sedation medicines used during the test).    FOLLOW UP: Our staff will call the number listed on your records the next business day following your procedure to check on you and address any questions or concerns that you may have regarding the information given to you following your procedure. If we do not reach you, we will leave a message.  However, if you are feeling well and you are not experiencing any problems, there is no need to return our call.  We will assume that you have returned to your regular daily activities without incident.  If any biopsies were taken you will be contacted by phone or by letter within the next 1-3 weeks.  Please call us at [phone] if you have not heard about the biopsies in 3 weeks.    SIGNATURES/CONFIDENTIALITY: You and/or your care partner have signed paperwork which will be entered into your electronic medical record.  These signatures attest to the fact that that the information above on your After Visit Summary has been reviewed and is understood.  Full responsibility of the confidentiality of this discharge information lies with you and/or your care-partner. 

## 2015-03-12 ENCOUNTER — Other Ambulatory Visit: Payer: Self-pay

## 2015-03-12 ENCOUNTER — Telehealth: Payer: Self-pay | Admitting: *Deleted

## 2015-03-12 ENCOUNTER — Ambulatory Visit (INDEPENDENT_AMBULATORY_CARE_PROVIDER_SITE_OTHER): Payer: BC Managed Care – PPO | Admitting: Internal Medicine

## 2015-03-12 DIAGNOSIS — E538 Deficiency of other specified B group vitamins: Secondary | ICD-10-CM

## 2015-03-12 MED ORDER — CYANOCOBALAMIN 1000 MCG/ML IJ SOLN
1000.0000 ug | Freq: Once | INTRAMUSCULAR | Status: AC
  Administered 2015-05-21: 1000 ug via INTRAMUSCULAR

## 2015-03-12 NOTE — Telephone Encounter (Signed)
  Telephone call follow-up endoscopy of 03/11/15.  No answer, left message to call if questions or concerns.

## 2015-03-23 ENCOUNTER — Encounter: Payer: Self-pay | Admitting: Internal Medicine

## 2015-03-26 ENCOUNTER — Ambulatory Visit (INDEPENDENT_AMBULATORY_CARE_PROVIDER_SITE_OTHER): Payer: BC Managed Care – PPO | Admitting: Internal Medicine

## 2015-03-26 DIAGNOSIS — E538 Deficiency of other specified B group vitamins: Secondary | ICD-10-CM

## 2015-03-29 ENCOUNTER — Ambulatory Visit (INDEPENDENT_AMBULATORY_CARE_PROVIDER_SITE_OTHER): Payer: BC Managed Care – PPO | Admitting: Cardiovascular Disease

## 2015-03-29 ENCOUNTER — Encounter: Payer: Self-pay | Admitting: Cardiovascular Disease

## 2015-03-29 ENCOUNTER — Ambulatory Visit (INDEPENDENT_AMBULATORY_CARE_PROVIDER_SITE_OTHER): Payer: BC Managed Care – PPO

## 2015-03-29 VITALS — BP 155/85 | HR 111 | Ht 67.0 in | Wt 171.7 lb

## 2015-03-29 DIAGNOSIS — I471 Supraventricular tachycardia: Secondary | ICD-10-CM

## 2015-03-29 DIAGNOSIS — D510 Vitamin B12 deficiency anemia due to intrinsic factor deficiency: Secondary | ICD-10-CM

## 2015-03-29 DIAGNOSIS — R000 Tachycardia, unspecified: Secondary | ICD-10-CM

## 2015-03-29 DIAGNOSIS — R002 Palpitations: Secondary | ICD-10-CM

## 2015-03-29 MED ORDER — METOPROLOL TARTRATE 25 MG PO TABS: 12.5000 mg | ORAL_TABLET | Freq: Two times a day (BID) | ORAL | Status: DC

## 2015-03-29 NOTE — Patient Instructions (Signed)
Dr Oval Linsey has recommended making the following medication changes: START Metoprolol tartrate 12.5 mg. You have been given a prescription for 25 mg tablets. Please take HALF a tablet TWICE daily. **PLEASE START AFTER YOU HAVE COMPLETED THE MONITOR.  Your physician has recommended that you wear a holter monitor. Holter monitors are medical devices that record the heart's electrical activity. Doctors most often use these monitors to diagnose arrhythmias. Arrhythmias are problems with the speed or rhythm of the heartbeat. The monitor is a small, portable device. You can wear one while you do your normal daily activities. This is usually used to diagnose what is causing palpitations/syncope (passing out).  [facility] [place] recommends that you schedule a follow-up appointment in 3 months.

## 2015-03-29 NOTE — Progress Notes (Signed)
Cardiology Office Note   Date:  03/29/2015   ID:  Gregory Collins, DOB 1970-11-16, MRN [id number]  PCP:  Leandrew Koyanagi, MD  Cardiologist:   Sharol Harness, MD   Chief Complaint  Patient presents with  . New Evaluation    feels his heart beating fast, uncomfortable//pt states no other Sx.      History of Present Illness: Gregory Collins is a 44 y.o. male who presents with hypertension, hyperlipidemia and diabetes type 2 who presents for an evaluation of palpitations. For the last several months he has noted occasional fast heart rates. He especially notices it when he exercises. When he places his hands on the monitor prior to starting exercise his heart rate has been as high as 115 and 120 bpm.  He denies any associated chest pain, shortness of breath, lightheadedness, dizziness, nausea or vomiting.  He denies palpitations but does feel as though his heart is beating fast.  He also notices it occasionally when lying down in bed at night, though it does not keep him from falling asleep or awaken him.  He notices the fast heart rate several times each day and it lasts for minutes at a time.  Gregory Collins denies fever, chills, or dysuria.  He has noted an occasional cough that is non-productive.  He denies chronic pain, anxiety or unusual stress.  He does drink 2-3 cups of coffee daily.  Thyroid function and electrolyte function are normal.  He is undergoing treatment for pernicious anemia and his last CBC showed normal h/h.   Past Medical History  Diagnosis Date  . Asthma   . GERD (gastroesophageal reflux disease)   . Diabetes mellitus without complication   . Hypertension   . Hyperlipidemia     Past Surgical History  Procedure Laterality Date  . Leg surgery      infant     Current Outpatient Prescriptions  Medication Sig Dispense Refill  . atorvastatin (LIPITOR) 20 MG tablet Take 1 tablet (20 mg total) by mouth daily. 90 tablet 3  . beclomethasone (QVAR) 40  MCG/ACT inhaler Inhale into the lungs 2 (two) times daily.    . cyanocobalamin (,VITAMIN B-12,) 1000 MCG/ML injection As directed 1 mL 12  . lansoprazole (PREVACID) 30 MG capsule Take 30 mg by mouth 2 (two) times daily.  5  . lisinopril (PRINIVIL,ZESTRIL) 10 MG tablet Take 10 mg by mouth daily.  3  . lisinopril-hydrochlorothiazide (PRINZIDE,ZESTORETIC) 10-12.5 MG per tablet Take 1 tablet by mouth daily. 30 tablet 2  . PROAIR HFA 108 (90 BASE) MCG/ACT inhaler   6  . metoprolol tartrate (LOPRESSOR) 25 MG tablet Take 0.5 tablets (12.5 mg total) by mouth 2 (two) times daily. 90 tablet 3   Current Facility-Administered Medications  Medication Dose Route Frequency Provider Last Rate Last Dose  . cyanocobalamin ((VITAMIN B-12)) injection 1,000 mcg  1,000 mcg Intramuscular UD Jerene Bears, MD   1,000 mcg at 03/26/15 0842  . cyanocobalamin ((VITAMIN B-12)) injection 1,000 mcg  1,000 mcg Intramuscular Once Jerene Bears, MD        Allergies:   Review of patient's allergies indicates no known allergies.    Social History:  The patient  reports that he has never smoked. He has never used smokeless tobacco. He reports that he drinks about 1.8 - 2.4 oz of alcohol per week. He reports that he does not use illicit drugs.   Family History:  The patient's family history includes Diabetes in  his father and mother; Kidney disease in his mother; Prostate cancer in his father.    ROS:  Please see the history of present illness.   Otherwise, review of systems are positive for none.   All other systems are reviewed and negative.    PHYSICAL EXAM: VS:  BP 155/85 mmHg  Pulse 111  Ht 5\' 7"  (1.702 m)  Wt 77.883 kg (171 lb 11.2 oz)  BMI 26.89 kg/m2 , BMI Body mass index is 26.89 kg/(m^2). GENERAL:  Well appearing HEENT:  Pupils equal round and reactive, fundi not visualized, oral mucosa unremarkable NECK:  No jugular venous distention, waveform within normal limits, carotid upstroke brisk and symmetric, no  bruits, no thyromegaly LYMPHATICS:  No cervical adenopathy LUNGS:  Clear to auscultation bilaterally HEART:  RRR.  PMI not displaced or sustained,S1 and S2 within normal limits, no S3, no S4, no clicks, no rubs, no murmurs ABD:  Flat, positive bowel sounds normal in frequency in pitch, no bruits, no rebound, no guarding, no midline pulsatile mass, no hepatomegaly, no splenomegaly EXT:  2 plus pulses throughout, no edema, no cyanosis no clubbing SKIN:  No rashes no nodules NEURO:  Cranial nerves II through XII grossly intact, motor grossly intact throughout PSYCH:  Cognitively intact, oriented to person place and time    EKG:  EKG is ordered today. The ekg ordered today demonstrates sinus tachycardia rate 111 bpm.     Recent Labs: 11/10/2014: ALT 15; BUN 10; Creat 0.97; Potassium 4.1; Sodium 138; TSH 1.030 01/25/2015: Hemoglobin 15.7; Platelets 245.0    Lipid Panel    Component Value Date/Time   CHOL 196 02/24/2015 1145   TRIG 140 02/24/2015 1145   HDL 45 02/24/2015 1145   CHOLHDL 4.4 02/24/2015 1145   VLDL 28 02/24/2015 1145   LDLCALC 123 02/24/2015 1145      Wt Readings from Last 3 Encounters:  03/29/15 77.883 kg (171 lb 11.2 oz)  03/11/15 76.658 kg (169 lb)  03/03/15 76.93 kg (169 lb 9.6 oz)      Other studies Reviewed: Additional studies/ records that were reviewed today include:.Review of the above records demonstrates:  Please see elsewhere in the note.     ASSESSMENT AND PLAN:  # Sinus tachycardia: Gregory Collins does not have any clear reasons to have resting sinus tachycardia.  His HR in clinic today is 111.  He endorses drinking 2-3 cups of coffee daily, which is the only identified possible cause.  There is no indication that he is infected and labs (TSH, CBC, electrolytes) are within normal limits.  He does not appear to be anxious and has no chronic pain.  He has no signs of lung disease, low output or heart failure on exam or by history.  We will obtain a 24 hour  Holter to determine how fast his  HR is and to assess whether it ever decreases.  We will also look for other arrhythmias.  - 24 hour Holter - Recommended decreasing caffeine intake - start metoprolol tartrate 12.5mg  bid for improved BP control and management of tachycardia  # Hypertension: BP above goal.  Will add metoprolol to his lisinopril/HCTZ.  # Hyperlipidemia: Managed by PCP.  Continue atorvastatin.  Current medicines are reviewed at length with the patient today.  The patient does not have concerns regarding medicines.  The following changes have been made:  Start metoprolol tartrate 12.5mg  bid.  Labs/ tests ordered today include:   Orders Placed This Encounter  Procedures  . Holter monitor -  24 hour  . EKG 12-Lead     Disposition:   FU with Dr. Jonelle Sidle C. [place] in 3 months.    Signed, Sharol Harness, MD  03/29/2015 9:36 PM    [place]

## 2015-04-01 ENCOUNTER — Telehealth: Payer: Self-pay | Admitting: Internal Medicine

## 2015-04-01 NOTE — Telephone Encounter (Signed)
Left message for pt to call back  °

## 2015-04-05 NOTE — Telephone Encounter (Signed)
Spoke with pt and reviewed path letter with him and questions were answered.

## 2015-04-07 ENCOUNTER — Telehealth: Payer: Self-pay | Admitting: *Deleted

## 2015-04-07 NOTE — Telephone Encounter (Signed)
-----   Message from Skeet Latch, MD sent at 04/05/2015 11:00 PM EDT ----- No abnormal heart rhythms.  Average heart rate was 99, which is normal.  No changes to current management.

## 2015-04-07 NOTE — Telephone Encounter (Signed)
Spoke to patient. Result given . Verbalized understanding  

## 2015-04-08 ENCOUNTER — Ambulatory Visit (INDEPENDENT_AMBULATORY_CARE_PROVIDER_SITE_OTHER): Payer: BC Managed Care – PPO | Admitting: Internal Medicine

## 2015-04-08 DIAGNOSIS — E538 Deficiency of other specified B group vitamins: Secondary | ICD-10-CM | POA: Diagnosis not present

## 2015-04-08 MED ORDER — CYANOCOBALAMIN 1000 MCG/ML IJ SOLN
1000.0000 ug | Freq: Once | INTRAMUSCULAR | Status: AC
  Administered 2015-04-08: 1000 ug via INTRAMUSCULAR

## 2015-04-20 NOTE — Progress Notes (Signed)
Cardiology Office Note   Date:  04/21/2015   ID:  Gregory Collins, DOB 1971-05-20, MRN [id number]  PCP:  Leandrew Koyanagi, MD  Cardiologist:   Sharol Harness, MD   Chief Complaint  Patient presents with  . Follow-up    med management//pt chest discomfort, "something is a little odd." Happening for a week, not sure if it is just his heart beating faster.//pt states no other Sx.      History of Present Illness: Gregory Collins is a 44 y.o. male who presents with hypertension, hyperlipidemia and diabetes type 2 who presents for follow up on palpitations.  He was seen on 9/19 and found to have a resting heart rate of 111 bpm.  He was asked to decrease his caffeine intake and started on metoprolol for both blood pressure and heart rate control.  He also had a 24 hour Holter which showed an average heart rate of 99 bpm.   He is feeling better most days.  He thinks that the metoprolol has helped and is feeling well.  At times he feels some L sided chest discomfort.  This occurs sporadically.  It is not always associated with exertion.  It is an annoying sensation similar to his prior palpitations.  Symptoms last from 30 minutes to an hour.  It is not associated with shortness of breath, palpitations, lightheadedness, or dizziness.   Past Medical History  Diagnosis Date  . Asthma   . GERD (gastroesophageal reflux disease)   . Diabetes mellitus without complication   . Hypertension   . Hyperlipidemia     Past Surgical History  Procedure Laterality Date  . Leg surgery      infant     Current Outpatient Prescriptions  Medication Sig Dispense Refill  . atorvastatin (LIPITOR) 20 MG tablet Take 1 tablet (20 mg total) by mouth daily. 90 tablet 3  . beclomethasone (QVAR) 40 MCG/ACT inhaler Inhale into the lungs 2 (two) times daily.    . lansoprazole (PREVACID) 30 MG capsule Take 30 mg by mouth 2 (two) times daily.  5  . lisinopril (PRINIVIL,ZESTRIL) 10 MG tablet Take 10 mg by  mouth daily.  3  . metoprolol tartrate (LOPRESSOR) 25 MG tablet Take 0.5 tablets (12.5 mg total) by mouth 2 (two) times daily. 90 tablet 3  . PROAIR HFA 108 (90 BASE) MCG/ACT inhaler   6  . aspirin EC 81 MG tablet Take 1 tablet (81 mg total) by mouth daily. 90 tablet 3   Current Facility-Administered Medications  Medication Dose Route Frequency Provider Last Rate Last Dose  . cyanocobalamin ((VITAMIN B-12)) injection 1,000 mcg  1,000 mcg Intramuscular Once Jerene Bears, MD        Allergies:   Review of patient's allergies indicates no known allergies.    Social History:  The patient  reports that he has never smoked. He has never used smokeless tobacco. He reports that he drinks about 1.8 - 2.4 oz of alcohol per week. He reports that he does not use illicit drugs.   Family History:  The patient's family history includes Diabetes in his father and mother; Kidney disease in his mother; Prostate cancer in his father.    ROS:  Please see the history of present illness.   Otherwise, review of systems are positive for none.   All other systems are reviewed and negative.    PHYSICAL EXAM: VS:  BP 120/86 mmHg  Pulse 80  Ht 5\' 7"  (1.702 m)  Wt 80.196 kg (176 lb 12.8 oz)  BMI 27.68 kg/m2 , BMI Body mass index is 27.68 kg/(m^2). GENERAL:  Well appearing HEENT:  Pupils equal round and reactive, fundi not visualized, oral mucosa unremarkable NECK:  No jugular venous distention, waveform within normal limits, carotid upstroke brisk and symmetric, no bruits, no thyromegaly LYMPHATICS:  No cervical adenopathy LUNGS:  Clear to auscultation bilaterally HEART:  RRR.  PMI not displaced or sustained,S1 and S2 within normal limits, no S3, no S4, no clicks, no rubs, no murmurs ABD:  Flat, positive bowel sounds normal in frequency in pitch, no bruits, no rebound, no guarding, no midline pulsatile mass, no hepatomegaly, no splenomegaly EXT:  2 plus pulses throughout, no edema, no cyanosis no clubbing SKIN:   No rashes no nodules NEURO:  Cranial nerves II through XII grossly intact, motor grossly intact throughout PSYCH:  Cognitively intact, oriented to person place and time    EKG:  EKG is ordered today. Sinus rhythm at 80 bpm.  24 Hour Holter 03/29/15:  Quality: Fair. Baseline artifact. Predominant rhythm: sinus rhythm Average heart rate: 99 bpm Max heart rate: 141 bpm Min heart rate: 65 bpm Pauses >2.5 seconds: 0 Ventricular ectopics: 0  Supraventricular ectopics: 0 Patient did not submit a symptom diary   Recent Labs: 11/10/2014: ALT 15; BUN 10; Creat 0.97; Potassium 4.1; Sodium 138; TSH 1.030 01/25/2015: Hemoglobin 15.7; Platelets 245.0    Lipid Panel    Component Value Date/Time   CHOL 196 02/24/2015 1145   TRIG 140 02/24/2015 1145   HDL 45 02/24/2015 1145   CHOLHDL 4.4 02/24/2015 1145   VLDL 28 02/24/2015 1145   LDLCALC 123 02/24/2015 1145      Wt Readings from Last 3 Encounters:  04/21/15 80.196 kg (176 lb 12.8 oz)  03/29/15 77.883 kg (171 lb 11.2 oz)  03/11/15 76.658 kg (169 lb)      Other studies Reviewed: Additional studies/ records that were reviewed today include:.Review of the above records demonstrates:  Please see elsewhere in the note.     ASSESSMENT AND PLAN:  # Chest discomfort:  Gregory Collins has atypical chest pain. We will obtain a treadmill stress test to rule out obstructive coronary disease.  # CV Disease Prevention: ASCVD 10 year risk is 9.8%.  Start aspirin 81 mg daily.  # Sinus tachycardia: Resolved after starting metoprolol and reducing his coffee intake. His palpitations have completely resolved. We'll continue metoprolol.  # Hypertension: BP well-controlled.  Continue metoprolol and lisinopril.  # Hyperlipidemia: Managed by PCP.  Continue atorvastatin.  Current medicines are reviewed at length with the patient today.  The patient does not have concerns regarding medicines.  The following changes have been made:  Start aspirin 81  mg daily.  Labs/ tests ordered today include:   Orders Placed This Encounter  Procedures  . Exercise Tolerance Test  . EKG 12-Lead     Disposition:   FU with Dr. Jonelle Sidle C. [place] in 6 months.    Signed, Sharol Harness, MD  04/21/2015 9:43 AM    [facility]

## 2015-04-21 ENCOUNTER — Ambulatory Visit (INDEPENDENT_AMBULATORY_CARE_PROVIDER_SITE_OTHER): Payer: BC Managed Care – PPO | Admitting: Cardiovascular Disease

## 2015-04-21 ENCOUNTER — Encounter: Payer: Self-pay | Admitting: Cardiovascular Disease

## 2015-04-21 VITALS — BP 120/86 | HR 80 | Ht 67.0 in | Wt 176.8 lb

## 2015-04-21 DIAGNOSIS — E785 Hyperlipidemia, unspecified: Secondary | ICD-10-CM | POA: Diagnosis not present

## 2015-04-21 DIAGNOSIS — I10 Essential (primary) hypertension: Secondary | ICD-10-CM

## 2015-04-21 DIAGNOSIS — R079 Chest pain, unspecified: Secondary | ICD-10-CM

## 2015-04-21 MED ORDER — ASPIRIN EC 81 MG PO TBEC: 81.0000 mg | DELAYED_RELEASE_TABLET | Freq: Every day | ORAL | Status: DC

## 2015-04-21 NOTE — Patient Instructions (Signed)
Your physician has recommended you make the following change in your medication: START ASPIRIN 81MG  DAILY.  Your physician has requested that you have an exercise tolerance test. For further information please visit [email or web address]. Please also follow instruction sheet, as given.  Your physician recommends that you schedule a follow-up appointment in: [place] [address] [place].

## 2015-04-22 ENCOUNTER — Ambulatory Visit (INDEPENDENT_AMBULATORY_CARE_PROVIDER_SITE_OTHER): Payer: BC Managed Care – PPO | Admitting: Internal Medicine

## 2015-04-22 ENCOUNTER — Encounter: Payer: Self-pay | Admitting: Cardiovascular Disease

## 2015-04-22 DIAGNOSIS — E538 Deficiency of other specified B group vitamins: Secondary | ICD-10-CM | POA: Diagnosis not present

## 2015-04-22 MED ORDER — CYANOCOBALAMIN 1000 MCG/ML IJ SOLN
1000.0000 ug | Freq: Once | INTRAMUSCULAR | Status: AC
  Administered 2015-04-22: 1000 ug via INTRAMUSCULAR

## 2015-05-07 ENCOUNTER — Ambulatory Visit (INDEPENDENT_AMBULATORY_CARE_PROVIDER_SITE_OTHER): Payer: BC Managed Care – PPO | Admitting: Internal Medicine

## 2015-05-07 DIAGNOSIS — E538 Deficiency of other specified B group vitamins: Secondary | ICD-10-CM | POA: Diagnosis not present

## 2015-05-07 MED ORDER — CYANOCOBALAMIN 1000 MCG/ML IJ SOLN
1000.0000 ug | Freq: Once | INTRAMUSCULAR | Status: AC
  Administered 2015-05-07: 1000 ug via INTRAMUSCULAR

## 2015-05-19 ENCOUNTER — Telehealth (HOSPITAL_COMMUNITY): Payer: Self-pay

## 2015-05-19 NOTE — Telephone Encounter (Signed)
Encounter complete. 

## 2015-05-21 ENCOUNTER — Ambulatory Visit (INDEPENDENT_AMBULATORY_CARE_PROVIDER_SITE_OTHER): Payer: BC Managed Care – PPO | Admitting: Internal Medicine

## 2015-05-21 ENCOUNTER — Ambulatory Visit (HOSPITAL_COMMUNITY)
Admission: RE | Admit: 2015-05-21 | Discharge: 2015-05-21 | Disposition: A | Discharge status: Home / Self Care | Payer: BC Managed Care – PPO | Source: Ambulatory Visit | Attending: Cardiovascular Disease | Admitting: Cardiovascular Disease

## 2015-05-21 DIAGNOSIS — R079 Chest pain, unspecified: Secondary | ICD-10-CM | POA: Insufficient documentation

## 2015-05-21 DIAGNOSIS — D509 Iron deficiency anemia, unspecified: Secondary | ICD-10-CM

## 2015-05-21 DIAGNOSIS — E538 Deficiency of other specified B group vitamins: Secondary | ICD-10-CM

## 2015-05-21 LAB — EXERCISE TOLERANCE TEST
CHL RATE OF PERCEIVED EXERTION: 17
CSEPEW: 11.7 METS
Exercise duration (min): 10 min
MPHR: 178 {beats}/min
Peak HR: 187 {beats}/min
Percent HR: 105 %
Rest HR: 126 {beats}/min

## 2015-05-24 ENCOUNTER — Telehealth: Payer: Self-pay | Admitting: *Deleted

## 2015-05-24 NOTE — Telephone Encounter (Signed)
-----   Message from Skeet Latch, MD sent at 05/21/2015  5:53 PM EST ----- Normal stress test.

## 2015-05-24 NOTE — Telephone Encounter (Signed)
Spoke to patient. Result given . Verbalized understanding  

## 2015-06-07 ENCOUNTER — Other Ambulatory Visit (INDEPENDENT_AMBULATORY_CARE_PROVIDER_SITE_OTHER): Payer: BC Managed Care – PPO

## 2015-06-07 ENCOUNTER — Encounter: Payer: Self-pay | Admitting: Internal Medicine

## 2015-06-07 ENCOUNTER — Other Ambulatory Visit: Payer: Self-pay | Admitting: Internal Medicine

## 2015-06-07 DIAGNOSIS — E538 Deficiency of other specified B group vitamins: Secondary | ICD-10-CM

## 2015-06-07 LAB — VITAMIN B12: VITAMIN B 12: 176 pg/mL — AB (ref 211–911)

## 2015-06-07 MED ORDER — CYANOCOBALAMIN 1000 MCG/ML IJ SOLN: INTRAMUSCULAR | Status: DC

## 2015-06-07 MED ORDER — CYANOCOBALAMIN 1000 MCG/ML IJ SOLN: 1000.0000 ug | INTRAMUSCULAR | Status: DC

## 2015-06-07 NOTE — Progress Notes (Signed)
I have spoken to patient to advise of Dr Vena Rua recommendations regarding B12. Patient verbalizes understanding. Rx has been sent for b12 as well as needles and syringes. Patient will bring his wife to b12 appointment to be instructed on injecting at home. Patient is advised to have b12 level redrawn in 2 months. He asks if anything further needs to be tested. Per Dr Hilarie Fredrickson, he would like patient to have large starting dose of b12 (previously had b12 1 ml every 2 weeks rather than daily) and if b12 still does not improve, we will send to hematology. Patient verbalizes understanding.

## 2015-06-08 ENCOUNTER — Ambulatory Visit (INDEPENDENT_AMBULATORY_CARE_PROVIDER_SITE_OTHER): Payer: BC Managed Care – PPO | Admitting: Internal Medicine

## 2015-06-08 ENCOUNTER — Telehealth: Payer: Self-pay | Admitting: Internal Medicine

## 2015-06-08 DIAGNOSIS — E538 Deficiency of other specified B group vitamins: Secondary | ICD-10-CM | POA: Diagnosis not present

## 2015-06-08 MED ORDER — CYANOCOBALAMIN 1000 MCG/ML IJ SOLN
1000.0000 ug | Freq: Every day | INTRAMUSCULAR | Status: AC
  Administered 2015-06-08 – 2015-06-10 (×2): 1000 ug via INTRAMUSCULAR

## 2015-06-09 ENCOUNTER — Other Ambulatory Visit: Payer: Self-pay

## 2015-06-09 ENCOUNTER — Ambulatory Visit (INDEPENDENT_AMBULATORY_CARE_PROVIDER_SITE_OTHER): Payer: BC Managed Care – PPO | Admitting: Internal Medicine

## 2015-06-09 DIAGNOSIS — E538 Deficiency of other specified B group vitamins: Secondary | ICD-10-CM

## 2015-06-09 DIAGNOSIS — D510 Vitamin B12 deficiency anemia due to intrinsic factor deficiency: Secondary | ICD-10-CM

## 2015-06-09 MED ORDER — CYANOCOBALAMIN 1000 MCG/ML IJ SOLN: INTRAMUSCULAR | Status: DC

## 2015-06-09 NOTE — Telephone Encounter (Signed)
Rx sent 

## 2015-06-10 DIAGNOSIS — E538 Deficiency of other specified B group vitamins: Secondary | ICD-10-CM | POA: Diagnosis not present

## 2015-06-28 NOTE — Progress Notes (Signed)
Cardiology Office Note   Date:  06/29/2015   ID:  ARSENIY LOSACCO, DOB 1971/05/15, MRN [id number]  PCP:  Leandrew Koyanagi, MD  Cardiologist:   Sharol Harness, MD   Chief Complaint  Patient presents with  . Follow-up    pt has no complaints   . Chest Pain  . Tachycardia      Patient ID: Gregory Collins is a 44 y.o. male who presents with hypertension, hyperlipidemia and diabetes type 2 who presents for follow up on palpitations.    Interval History 06/29/15: Gregory Collins was last seen on 04/20/15.  At that appointment he was feeling better after starting metoprolol for palpitations.  However, he did report chest discomfort.  He subsequently underwent an ETT that was negative for ischemia.  He had a hypertensive response to exercise.  He sometimes feels tired when walking around the campus at [facility].  He also feels some discomfort, especially at night.  This occurs approximately 3 times per week.  The episodes last for 15-20 minute and are not associated with SOB of dizziness.  He denies exertional chest pain.  He notes that his weight has been increasing lately.  He denies nausea, lower extremity edema, orthopnea and PND.  History of Present Illness 03/29/15:  He was seen on 9/19 and found to have a resting heart rate of 111 bpm.  He was asked to decrease his caffeine intake and started on metoprolol for both blood pressure and heart rate control.  He also had a 24 hour Holter which showed an average heart rate of 99 bpm.   He is feeling better most days.  He thinks that the metoprolol has helped and is feeling well.  At times he feels some L sided chest discomfort.  This occurs sporadically.  It is not always associated with exertion.  It is an annoying sensation similar to his prior palpitations.  Symptoms last from 30 minutes to an hour.  It is not associated with shortness of breath, palpitations, lightheadedness, or dizziness.   Past Medical History  Diagnosis Date  .  Asthma   . GERD (gastroesophageal reflux disease)   . Diabetes mellitus without complication ([place])   . Hypertension   . Hyperlipidemia   . Sinus tachycardia ([place]) 06/29/2015    Past Surgical History  Procedure Laterality Date  . Leg surgery      infant     Current Outpatient Prescriptions  Medication Sig Dispense Refill  . aspirin EC 81 MG tablet Take 1 tablet (81 mg total) by mouth daily. 90 tablet 3  . atorvastatin (LIPITOR) 20 MG tablet Take 1 tablet (20 mg total) by mouth daily. 90 tablet 3  . beclomethasone (QVAR) 40 MCG/ACT inhaler Inhale into the lungs 2 (two) times daily.    . cyanocobalamin (,VITAMIN B-12,) 1000 MCG/ML injection Inject 77ml IM once daily x 7 days, then 1 ml IM once weekly x 4 weeks, then once monthly thereafter. PHARMACY-please provide appropriate needles and syringes... D/c any previous b12 scripts. thx 15 mL 0  . lansoprazole (PREVACID) 30 MG capsule Take 30 mg by mouth 2 (two) times daily.  5  . metoprolol tartrate (LOPRESSOR) 25 MG tablet Take 0.5 tablets (12.5 mg total) by mouth 2 (two) times daily. 90 tablet 3  . PROAIR HFA 108 (90 BASE) MCG/ACT inhaler   6  . lisinopril (PRINIVIL,ZESTRIL) 20 MG tablet Take 1 tablet (20 mg total) by mouth daily. 90 tablet 3   No current  facility-administered medications for this visit.    Allergies:   Review of patient's allergies indicates no known allergies.    Social History:  The patient  reports that he has never smoked. He has never used smokeless tobacco. He reports that he drinks about 1.8 - 2.4 oz of alcohol per week. He reports that he does not use illicit drugs.   Family History:  The patient's family history includes Diabetes in his father and mother; Kidney disease in his mother; Prostate cancer in his father.    ROS:  Please see the history of present illness.   Otherwise, review of systems are positive for none.   All other systems are reviewed and negative.    PHYSICAL EXAM: VS:  BP 150/90 mmHg   Pulse 68  Ht 5\' 8"  (1.727 m)  Wt 86.32 kg (190 lb 4.8 oz)  BMI 28.94 kg/m2 , BMI Body mass index is 28.94 kg/(m^2). GENERAL:  Well appearing HEENT:  Pupils equal round and reactive, fundi not visualized, oral mucosa unremarkable NECK:  No jugular venous distention, waveform within normal limits, carotid upstroke brisk and symmetric, no bruits, no thyromegaly LYMPHATICS:  No cervical adenopathy LUNGS:  Clear to auscultation bilaterally HEART:  RRR.  PMI not displaced or sustained,S1 and S2 within normal limits, no S3, no S4, no clicks, no rubs, no murmurs ABD:  Flat, positive bowel sounds normal in frequency in pitch, no bruits, no rebound, no guarding, no midline pulsatile mass, no hepatomegaly, no splenomegaly EXT:  2 plus pulses throughout, no edema, no cyanosis no clubbing SKIN:  No rashes no nodules NEURO:  Cranial nerves II through XII grossly intact, motor grossly intact throughout PSYCH:  Cognitively intact, oriented to person place and time    EKG:  EKG is not ordered today.   24 Hour Holter 03/29/15:  Quality: Fair. Baseline artifact. Predominant rhythm: sinus rhythm Average heart rate: 99 bpm Max heart rate: 141 bpm Min heart rate: 65 bpm Pauses >2.5 seconds: 0 Ventricular ectopics: 0  Supraventricular ectopics: 0 Patient did not submit a symptom diary   Recent Labs: 11/10/2014: ALT 15; BUN 10; Creat 0.97; Potassium 4.1; Sodium 138; TSH 1.030 01/25/2015: Hemoglobin 15.7; Platelets 245.0    Lipid Panel    Component Value Date/Time   CHOL 196 02/24/2015 1145   TRIG 140 02/24/2015 1145   HDL 45 02/24/2015 1145   CHOLHDL 4.4 02/24/2015 1145   VLDL 28 02/24/2015 1145   LDLCALC 123 02/24/2015 1145      Wt Readings from Last 3 Encounters:  06/29/15 86.32 kg (190 lb 4.8 oz)  04/21/15 80.196 kg (176 lb 12.8 oz)  03/29/15 77.883 kg (171 lb 11.2 oz)    Other studies Reviewed: Additional studies/ records that were reviewed today include:.Review of the above  records demonstrates:  Please see elsewhere in the note.    ETT 05/21/15:     Blood pressure demonstrated a hypertensive response to exercise.  There was no ST segment deviation noted during stress.  Negative, adequate stress test. Excellent exercise capacity.  ASSESSMENT AND PLAN:  # Hypertension: BPis not  well-controlled.  Continue metoprolol and increase lisinopril to 20mg  daily.  He will check his blood pressure at home. If it remains above 140/90, he will increase lisinopril to 40 mg. We will check a basic metabolic panel in 2 weeks.  # Atypical chest pain: s as aboveStress test was negative for ischemia. He did, however show a hypertensive response to exercise. We will increase his antihypertensive  #  CV Disease Prevention: ASCVD 10 year risk is 9.8%.  Continue aspirin 81 mg daily.  # Sinus tachycardia: Resolved after starting metoprolol and reducing his coffee intake. His palpitations have completely resolved. We'll continue metoprolol at current dosing.  # Hyperlipidemia: Managed by PCP.  Continue atorvastatin.  Current medicines are reviewed at length with the patient today.  The patient does not have concerns regarding medicines.  The following changes have been made:  Increase lisinopril to 20 mg daily.  Labs/ tests ordered today include:   Orders Placed This Encounter  Procedures  . Basic metabolic panel     Disposition:   FU with Dr. Jonelle Sidle C. Oval Linsey in 1 year.   Signed, Sharol Harness, MD  06/29/2015 8:23 AM    [facility]

## 2015-06-29 ENCOUNTER — Ambulatory Visit (INDEPENDENT_AMBULATORY_CARE_PROVIDER_SITE_OTHER): Payer: BC Managed Care – PPO | Admitting: Cardiovascular Disease

## 2015-06-29 ENCOUNTER — Encounter: Payer: Self-pay | Admitting: Cardiovascular Disease

## 2015-06-29 VITALS — BP 150/90 | HR 68 | Ht 68.0 in | Wt 190.3 lb

## 2015-06-29 DIAGNOSIS — R072 Precordial pain: Secondary | ICD-10-CM | POA: Diagnosis not present

## 2015-06-29 DIAGNOSIS — E785 Hyperlipidemia, unspecified: Secondary | ICD-10-CM

## 2015-06-29 DIAGNOSIS — R000 Tachycardia, unspecified: Secondary | ICD-10-CM | POA: Diagnosis not present

## 2015-06-29 DIAGNOSIS — I10 Essential (primary) hypertension: Secondary | ICD-10-CM | POA: Diagnosis not present

## 2015-06-29 DIAGNOSIS — Z79899 Other long term (current) drug therapy: Secondary | ICD-10-CM | POA: Diagnosis not present

## 2015-06-29 HISTORY — DX: Tachycardia, unspecified: R00.0

## 2015-06-29 MED ORDER — LISINOPRIL 20 MG PO TABS: 20.0000 mg | ORAL_TABLET | Freq: Every day | ORAL | Status: DC

## 2015-06-29 NOTE — Patient Instructions (Signed)
INCREASE LISINOPRIL TO 20 MG ONE TABLET DAILY  IN 2 WEEKS PLEASE HAVE LABS DRAW   Your physician wants you to follow-up in 12 MONTHS DR [place]. You will receive a reminder letter in the mail two months in advance. If you don't receive a letter, please call our office to schedule the follow-up appointment.  If you need a refill on your cardiac medications before your next appointment, please call your pharmacy.

## 2015-07-13 ENCOUNTER — Ambulatory Visit (INDEPENDENT_AMBULATORY_CARE_PROVIDER_SITE_OTHER): Payer: BC Managed Care – PPO | Admitting: Internal Medicine

## 2015-07-13 ENCOUNTER — Telehealth: Payer: Self-pay

## 2015-07-13 DIAGNOSIS — E538 Deficiency of other specified B group vitamins: Secondary | ICD-10-CM | POA: Diagnosis not present

## 2015-07-13 MED ORDER — CYANOCOBALAMIN 1000 MCG/ML IJ SOLN
1000.0000 ug | Freq: Once | INTRAMUSCULAR | Status: AC
  Administered 2015-07-13: 1000 ug via INTRAMUSCULAR

## 2015-07-13 NOTE — Telephone Encounter (Signed)
Patient received last of 4 weekly B12 injections before starting monthly.  Per Dr. Hilarie Fredrickson he was to have a B12 lab drawn 2 months from 06/07/2015.  Patient will come at the end of January to have this drawn and I will schedule him for a 12 injection for several days later, giving Dr. Hilarie Fredrickson time to review the lab and make changes if necessary.  If no changes, then he already has his first monthly appointment scheduled.

## 2015-07-14 LAB — BASIC METABOLIC PANEL
BUN: 11 mg/dL (ref 7–25)
CALCIUM: 8.7 mg/dL (ref 8.6–10.3)
CO2: 29 mmol/L (ref 20–31)
CREATININE: 0.94 mg/dL (ref 0.60–1.35)
Chloride: 101 mmol/L (ref 98–110)
Glucose, Bld: 114 mg/dL — ABNORMAL HIGH (ref 65–99)
Potassium: 4 mmol/L (ref 3.5–5.3)
Sodium: 137 mmol/L (ref 135–146)

## 2015-07-19 ENCOUNTER — Telehealth: Payer: Self-pay | Admitting: *Deleted

## 2015-07-19 NOTE — Telephone Encounter (Signed)
Left detailed message to secure voicemail with results. Any question to call back.

## 2015-07-19 NOTE — Telephone Encounter (Signed)
-----   Message from Skeet Latch, MD sent at 07/18/2015  3:36 PM EST ----- Normal kidney function and electrolytes.  Continue lisinopril at current dosing.

## 2015-07-26 ENCOUNTER — Telehealth: Payer: Self-pay

## 2015-07-26 NOTE — Telephone Encounter (Signed)
-----   Message from Algernon Huxley, RN sent at 06/11/2015  3:30 PM EST ----- Regarding: FW: B12 level Needs b12 in Jan. ----- Message -----    From: Algernon Huxley, RN    Sent: 06/10/2015      To: Algernon Huxley, RN Subject: FW: B12 level                                    ----- Message -----    From: Algernon Huxley, RN    Sent: 03/12/2015   1:42 PM      To: Algernon Huxley, RN Subject: B12 level                                      Pt needs B12 in December,  Order in epic.

## 2015-07-26 NOTE — Telephone Encounter (Signed)
Pt aware.

## 2015-07-30 LAB — HM DIABETES EYE EXAM

## 2015-08-03 DIAGNOSIS — R0989 Other specified symptoms and signs involving the circulatory and respiratory systems: Secondary | ICD-10-CM | POA: Insufficient documentation

## 2015-08-03 DIAGNOSIS — J45909 Unspecified asthma, uncomplicated: Secondary | ICD-10-CM | POA: Insufficient documentation

## 2015-08-03 DIAGNOSIS — J4530 Mild persistent asthma, uncomplicated: Secondary | ICD-10-CM | POA: Insufficient documentation

## 2015-08-03 DIAGNOSIS — Z789 Other specified health status: Secondary | ICD-10-CM | POA: Insufficient documentation

## 2015-08-03 DIAGNOSIS — Z125 Encounter for screening for malignant neoplasm of prostate: Secondary | ICD-10-CM | POA: Insufficient documentation

## 2015-08-04 ENCOUNTER — Other Ambulatory Visit: Payer: Self-pay

## 2015-08-04 ENCOUNTER — Other Ambulatory Visit (INDEPENDENT_AMBULATORY_CARE_PROVIDER_SITE_OTHER): Payer: BC Managed Care – PPO

## 2015-08-04 DIAGNOSIS — E538 Deficiency of other specified B group vitamins: Secondary | ICD-10-CM

## 2015-08-04 LAB — VITAMIN B12: VITAMIN B 12: 255 pg/mL (ref 211–911)

## 2015-08-10 ENCOUNTER — Ambulatory Visit (INDEPENDENT_AMBULATORY_CARE_PROVIDER_SITE_OTHER): Payer: BC Managed Care – PPO | Admitting: Internal Medicine

## 2015-08-10 ENCOUNTER — Telehealth: Payer: Self-pay

## 2015-08-10 DIAGNOSIS — E538 Deficiency of other specified B group vitamins: Secondary | ICD-10-CM

## 2015-08-10 MED ORDER — CYANOCOBALAMIN 1000 MCG/ML IJ SOLN
1000.0000 ug | Freq: Once | INTRAMUSCULAR | Status: AC
  Administered 2015-08-10: 1000 ug via INTRAMUSCULAR

## 2015-08-10 NOTE — Telephone Encounter (Signed)
Left message regarding his questions about how often he should have B12 injections:  Per Dr. Hilarie Fredrickson, his B12 level , while low normal, is still normal and not concerning.   There is really no reason to have an injection weekly unless you just really want to.  Another option is to stay on the every 2 weeks and then have blood work done in 6 weeks instead of 3 months just to check it.  He anticipates your B12 level to stay normal and slowly rise on the every 2 week injections.

## 2015-08-19 ENCOUNTER — Ambulatory Visit (INDEPENDENT_AMBULATORY_CARE_PROVIDER_SITE_OTHER): Payer: BC Managed Care – PPO | Admitting: Family Medicine

## 2015-08-19 VITALS — BP 124/74 | HR 90 | Temp 99.1°F | Resp 18 | Ht 67.0 in | Wt 183.0 lb

## 2015-08-19 DIAGNOSIS — J069 Acute upper respiratory infection, unspecified: Secondary | ICD-10-CM | POA: Diagnosis not present

## 2015-08-19 DIAGNOSIS — J9801 Acute bronchospasm: Secondary | ICD-10-CM | POA: Diagnosis not present

## 2015-08-19 NOTE — Patient Instructions (Signed)
Unfortunately it does appear that you have an upper respiratory infection, but it appears that it is probably a virus.  Continue the albuterol as needed if you are wheezing, but if you have to use that medication more than 2-3 times a day or persistently needing to use it in the next 3 days, return for recheck as you may need other medication or x-rays. Okay to try Mucinex or Mucinex DM, but stop that medicine if it does increase wheezing.   Return to the clinic or go to the nearest emergency room if any of your symptoms worsen or new symptoms occur.   Upper Respiratory Infection, Adult Most upper respiratory infections (URIs) are a viral infection of the air passages leading to the lungs. A URI affects the nose, throat, and upper air passages. The most common type of URI is nasopharyngitis and is typically referred to as "the common cold." URIs run their course and usually go away on their own. Most of the time, a URI does not require medical attention, but sometimes a bacterial infection in the upper airways can follow a viral infection. This is called a secondary infection. Sinus and middle ear infections are common types of secondary upper respiratory infections. Bacterial pneumonia can also complicate a URI. A URI can worsen asthma and chronic obstructive pulmonary disease (COPD). Sometimes, these complications can require emergency medical care and may be life threatening.  CAUSES Almost all URIs are caused by viruses. A virus is a type of germ and can spread from one person to another.  RISKS FACTORS You may be at risk for a URI if:   You smoke.   You have chronic heart or lung disease.  You have a weakened defense (immune) system.   You are very young or very old.   You have nasal allergies or asthma.  You work in crowded or poorly ventilated areas.  You work in health care facilities or schools. SIGNS AND SYMPTOMS  Symptoms typically develop 2-3 days after you come in contact  with a cold virus. Most viral URIs last 7-10 days. However, viral URIs from the influenza virus (flu virus) can last 14-18 days and are typically more severe. Symptoms may include:   Runny or stuffy (congested) nose.   Sneezing.   Cough.   Sore throat.   Headache.   Fatigue.   Fever.   Loss of appetite.   Pain in your forehead, behind your eyes, and over your cheekbones (sinus pain).  Muscle aches.  DIAGNOSIS  Your health care provider may diagnose a URI by:  Physical exam.  Tests to check that your symptoms are not due to another condition such as:  Strep throat.  Sinusitis.  Pneumonia.  Asthma. TREATMENT  A URI goes away on its own with time. It cannot be cured with medicines, but medicines may be prescribed or recommended to relieve symptoms. Medicines may help:  Reduce your fever.  Reduce your cough.  Relieve nasal congestion. HOME CARE INSTRUCTIONS   Take medicines only as directed by your health care provider.   Gargle warm saltwater or take cough drops to comfort your throat as directed by your health care provider.  Use a warm mist humidifier or inhale steam from a shower to increase air moisture. This may make it easier to breathe.  Drink enough fluid to keep your urine clear or pale yellow.   Eat soups and other clear broths and maintain good nutrition.   Rest as needed.   Return to  work when your temperature has returned to normal or as your health care provider advises. You may need to stay home longer to avoid infecting others. You can also use a face mask and careful hand washing to prevent spread of the virus.  Increase the usage of your inhaler if you have asthma.   Do not use any tobacco products, including cigarettes, chewing tobacco, or electronic cigarettes. If you need help quitting, ask your health care provider. PREVENTION  The best way to protect yourself from getting a cold is to practice good hygiene.   Avoid  oral or hand contact with people with cold symptoms.   Wash your hands often if contact occurs.  There is no clear evidence that vitamin C, vitamin E, echinacea, or exercise reduces the chance of developing a cold. However, it is always recommended to get plenty of rest, exercise, and practice good nutrition.  SEEK MEDICAL CARE IF:   You are getting worse rather than better.   Your symptoms are not controlled by medicine.   You have chills.  You have worsening shortness of breath.  You have brown or red mucus.  You have yellow or brown nasal discharge.  You have pain in your face, especially when you bend forward.  You have a fever.  You have swollen neck glands.  You have pain while swallowing.  You have white areas in the back of your throat. SEEK IMMEDIATE MEDICAL CARE IF:   You have severe or persistent:  Headache.  Ear pain.  Sinus pain.  Chest pain.  You have chronic lung disease and any of the following:  Wheezing.  Prolonged cough.  Coughing up blood.  A change in your usual mucus.  You have a stiff neck.  You have changes in your:  Vision.  Hearing.  Thinking.  Mood. MAKE SURE YOU:   Understand these instructions.  Will watch your condition.  Will get help right away if you are not doing well or get worse.   This information is not intended to replace advice given to you by your health care provider. Make sure you discuss any questions you have with your health care provider.   Document Released: 12/20/2000 Document Revised: 11/10/2014 Document Reviewed: 10/01/2013 Elsevier Interactive Patient Education 2016 [place].   Asthma, Acute Bronchospasm Acute bronchospasm caused by asthma is also referred to as an asthma attack. Bronchospasm means your air passages become narrowed. The narrowing is caused by inflammation and tightening of the muscles in the air tubes (bronchi) in your lungs. This can make it hard to breathe or  cause you to wheeze and cough. CAUSES Possible triggers are:  Animal dander from the skin, hair, or feathers of animals.  Dust mites contained in house dust.  Cockroaches.  Pollen from trees or grass.  Mold.  Cigarette or tobacco smoke.  Air pollutants such as dust, household cleaners, hair sprays, aerosol sprays, paint fumes, strong chemicals, or strong odors.  Cold air or weather changes. Cold air may trigger inflammation. Winds increase molds and pollens in the air.  Strong emotions such as crying or laughing hard.  Stress.  Certain medicines such as aspirin or beta-blockers.  Sulfites in foods and drinks, such as dried fruits and wine.  Infections or inflammatory conditions, such as a flu, cold, or inflammation of the nasal membranes (rhinitis).  Gastroesophageal reflux disease (GERD). GERD is a condition where stomach acid backs up into your esophagus.  Exercise or strenuous activity. SIGNS AND SYMPTOMS  Wheezing.  Excessive coughing, particularly at night.  Chest tightness.  Shortness of breath. DIAGNOSIS  Your health care provider will ask you about your medical history and perform a physical exam. A chest X-ray or blood testing may be performed to look for other causes of your symptoms or other conditions that may have triggered your asthma attack. TREATMENT  Treatment is aimed at reducing inflammation and opening up the airways in your lungs. Most asthma attacks are treated with inhaled medicines. These include quick relief or rescue medicines (such as bronchodilators) and controller medicines (such as inhaled corticosteroids). These medicines are sometimes given through an inhaler or a nebulizer. Systemic steroid medicine taken by mouth or given through an IV tube also can be used to reduce the inflammation when an attack is moderate or severe. Antibiotic medicines are only used if a bacterial infection is present.  HOME CARE INSTRUCTIONS   Rest.  Drink  plenty of liquids. This helps the mucus to remain thin and be easily coughed up. Only use caffeine in moderation and do not use alcohol until you have recovered from your illness.  Do not smoke. Avoid being exposed to secondhand smoke.  You play a critical role in keeping yourself in good health. Avoid exposure to things that cause you to wheeze or to have breathing problems.  Keep your medicines up-to-date and available. Carefully follow your health care provider's treatment plan.  Take your medicine exactly as prescribed.  When pollen or pollution is bad, keep windows closed and use an air conditioner or go to places with air conditioning.  Asthma requires careful medical care. See your health care provider for a follow-up as advised. If you are more than [redacted] weeks pregnant and you were prescribed any new medicines, let your obstetrician know about the visit and how you are doing. Follow up with your health care provider as directed.  After you have recovered from your asthma attack, make an appointment with your outpatient doctor to talk about ways to reduce the likelihood of future attacks. If you do not have a doctor who manages your asthma, make an appointment with a primary care doctor to discuss your asthma. SEEK IMMEDIATE MEDICAL CARE IF:   You are getting worse.  You have trouble breathing. If severe, call your local emergency services (911 in the [place]  You develop chest pain or discomfort.  You are vomiting.  You are not able to keep fluids down.  You are coughing up yellow, green, brown, or bloody sputum.  You have a fever and your symptoms suddenly get worse.  You have trouble swallowing. MAKE SURE YOU:   Understand these instructions.  Will watch your condition.  Will get help right away if you are not doing well or get worse.   This information is not intended to replace advice given to you by your health care provider. Make sure you discuss any questions you  have with your health care provider.   Document Released: 10/11/2006 Document Revised: 07/01/2013 Document Reviewed: 01/01/2013 Elsevier Interactive Patient Education [employer].

## 2015-08-19 NOTE — Progress Notes (Signed)
Subjective:  By signing my name below, I, Moises Blood, attest that this documentation has been prepared under the direction and in the presence of [name], MD. Electronically Signed: Moises Blood, [place]. 08/19/2015 , 6:12 PM .  Patient was seen in Room 5 .   Patient ID: Gregory Collins, male    DOB: 08-17-1970, 45 y.o.   MRN: [id number] Chief Complaint  Patient presents with  . Headache    sin. drainage; congestion x 2 days  . Generalized Body Aches  . Cough    some wheezing   HPI Gregory Collins is a 45 y.o. male with h/o HTN, DM, GERD and asthma. Other medical problems in problem list.  Here for cough congestion and wheezing. He was seen on jan 12th at [facility] for asthma and reactive airway and was started on Qvar and albuterol.   He states that the cough, congestion and wheezing started about 3 days ago. He also feels very fatigue from this illness. He's been using his qvar inhaler. He notices more wheezing when he lays down. He's also used albuterol inhaler prn, about 4 times for the wheezing (last use this morning). He denies GERD. He's had sickness contact from his 2 children. He denies fever or shortness of breath.   He denies receiving flu shot this year.  He works as a Network engineer at [employer].   Patient Active Problem List   Diagnosis Date Noted  . Airway hyperreactivity 08/03/2015  . Caffeine use 08/03/2015  . Globus pharyngeus 08/03/2015  . Sinus tachycardia ([place]) 06/29/2015  . Pernicious anemia 03/29/2015  . B12 deficiency 11/12/2014  . Diabetes mellitus without complication ([place]) [id number]  . Hyperlipidemia 11/10/2014  . Essential hypertension 11/10/2014  . Tonsillar debris 06/13/2013  . Migraine 08/20/2011  . Reflux 08/20/2011  . GERD (gastroesophageal reflux disease) 06/20/2011  . 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 Asthma   . GERD (gastroesophageal reflux disease)   . Diabetes mellitus  without complication ([place])   . Hypertension   . Hyperlipidemia   . Sinus tachycardia ([place]) 06/29/2015   Past Surgical History  Procedure Laterality Date  . Leg surgery      infant   No Known Allergies Prior to Admission medications   Medication Sig Start Date End Date Taking? Authorizing Provider  aspirin EC 81 MG tablet Take 1 tablet (81 mg total) by mouth daily. 04/21/15  Yes Skeet Latch, MD  atorvastatin (LIPITOR) 20 MG tablet Take 1 tablet (20 mg total) by mouth daily. 11/12/14  Yes Leandrew Koyanagi, MD  beclomethasone (QVAR) 40 MCG/ACT inhaler Inhale into the lungs 2 (two) times daily.   Yes Historical Provider, MD  cyanocobalamin (,VITAMIN B-12,) 1000 MCG/ML injection Inject 56ml IM once daily x 7 days, then 1 ml IM once weekly x 4 weeks, then once monthly thereafter. PHARMACY-please provide appropriate needles and syringes... D/c any previous b12 scripts. thx 06/09/15  Yes Jerene Bears, MD  lansoprazole (PREVACID) 30 MG capsule Take 30 mg by mouth 2 (two) times daily. 09/21/14  Yes Historical Provider, MD  lisinopril (PRINIVIL,ZESTRIL) 20 MG tablet Take 1 tablet (20 mg total) by mouth daily. 06/29/15  Yes Skeet Latch, MD  metoprolol tartrate (LOPRESSOR) 25 MG tablet Take 0.5 tablets (12.5 mg total) by mouth 2 (two) times daily. 03/29/15  Yes Skeet Latch, MD  PROAIR HFA 108 [phone] BASE) MCG/ACT inhaler  10/14/14  Yes Historical Provider, MD   Social History  Social History  . Marital Status: Married    Spouse Name: N/A  . Number of Children: 2  . 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 Smoking status: Never Smoker   . Smokeless tobacco: Never Used  . Alcohol Use: 1.8 - 2.4 oz/week    0 Standard drinks or equivalent, 3-4 Cans of beer per week     Comment: social  . Drug Use: No  . Sexual Activity: Yes    Birth Control/ Protection: None   Other Topics Concern  . Not on file   Social History Narrative    ** Merged History Encounter **       Review of Systems  Constitutional: Positive for fatigue. Negative for fever and chills.  HENT: Positive for congestion and rhinorrhea. Negative for sinus pressure and sore throat.   Respiratory: Positive for cough and wheezing. Negative for shortness of breath.   Gastrointestinal: Negative for nausea, vomiting and diarrhea.  Musculoskeletal: Positive for myalgias.       Objective:   Physical Exam  Constitutional: He is oriented to person, place, and time. He appears well-developed and well-nourished. No distress.  HENT:  Head: Normocephalic and atraumatic.  Right Ear: Tympanic membrane normal.  Left Ear: Tympanic membrane normal.  Nose: Nose normal. Right sinus exhibits no maxillary sinus tenderness and no frontal sinus tenderness. Left sinus exhibits no maxillary sinus tenderness and no frontal sinus tenderness.  Mouth/Throat: Oropharynx is clear and moist.  Minimal cerumen in left canal  Eyes: EOM are normal. Pupils are equal, round, and reactive to light.  Neck: Neck supple.  Cardiovascular: Normal rate.   Pulmonary/Chest: Effort normal and breath sounds normal. No respiratory distress. He has no wheezes (no wheeze upon forced expiration).  Musculoskeletal: Normal range of motion. He exhibits no edema.  Lymphadenopathy:    He has no cervical adenopathy.  Neurological: He is alert and oriented to person, place, and time.  Skin: Skin is warm and dry.  Psychiatric: He has a normal mood and affect. His behavior is normal.  Nursing note and vitals reviewed.   Filed Vitals:   08/19/15 1658  BP: 124/74  Pulse: 90  Temp: 99.1 F (37.3 C)  TempSrc: Oral  Resp: 18  Height: 5\' 7"  (1.702 m)  Weight: 183 lb (83.008 kg)  SpO2: 96%      Assessment & Plan:   Gregory Collins is a 45 y.o. male Acute upper respiratory infection  Bronchospasm   Viral URI likely, sx care discussed. Continue albuterol if needed, rtc precautions if frequent  use/need. otc mucinex ok if not increasing wheeze.    Meds ordered this encounter  Medications  . beclomethasone (QVAR) 40 MCG/ACT inhaler    Sig: Inhale into the lungs.  Marland Kitchen albuterol (PROAIR HFA) 108 (90 Base) MCG/ACT inhaler    Sig:   . aspirin EC 81 MG tablet    Sig: Take by mouth.  . cyanocobalamin (,VITAMIN B-12,) 1000 MCG/ML injection    Sig:   . lansoprazole (PREVACID) 30 MG capsule    Sig: Take by mouth.  Marland Kitchen lisinopril (PRINIVIL,ZESTRIL) 10 MG tablet    Sig:    Patient Instructions  Unfortunately it does appear that you have an upper respiratory infection, but it appears that it is probably a virus.  Continue the albuterol as needed if you are wheezing, but if you have to use that medication more than 2-3 times a day or  persistently needing to use it in the next 3 days, return for recheck as you may need other medication or x-rays. Okay to try Mucinex or Mucinex DM, but stop that medicine if it does increase wheezing.   Return to the clinic or go to the nearest emergency room if any of your symptoms worsen or new symptoms occur.   Upper Respiratory Infection, Adult Most upper respiratory infections (URIs) are a viral infection of the air passages leading to the lungs. A URI affects the nose, throat, and upper air passages. The most common type of URI is nasopharyngitis and is typically referred to as "the common cold." URIs run their course and usually go away on their own. Most of the time, a URI does not require medical attention, but sometimes a bacterial infection in the upper airways can follow a viral infection. This is called a secondary infection. Sinus and middle ear infections are common types of secondary upper respiratory infections. Bacterial pneumonia can also complicate a URI. A URI can worsen asthma and chronic obstructive pulmonary disease (COPD). Sometimes, these complications can require emergency medical care and may be life threatening.  CAUSES Almost all URIs  are caused by viruses. A virus is a type of germ and can spread from one person to another.  RISKS FACTORS You may be at risk for a URI if:   You smoke.   You have chronic heart or lung disease.  You have a weakened defense (immune) system.   You are very young or very old.   You have nasal allergies or asthma.  You work in crowded or poorly ventilated areas.  You work in health care facilities or schools. SIGNS AND SYMPTOMS  Symptoms typically develop 2-3 days after you come in contact with a cold virus. Most viral URIs last 7-10 days. However, viral URIs from the influenza virus (flu virus) can last 14-18 days and are typically more severe. Symptoms may include:   Runny or stuffy (congested) nose.   Sneezing.   Cough.   Sore throat.   Headache.   Fatigue.   Fever.   Loss of appetite.   Pain in your forehead, behind your eyes, and over your cheekbones (sinus pain).  Muscle aches.  DIAGNOSIS  Your health care provider may diagnose a URI by:  Physical exam.  Tests to check that your symptoms are not due to another condition such as:  Strep throat.  Sinusitis.  Pneumonia.  Asthma. TREATMENT  A URI goes away on its own with time. It cannot be cured with medicines, but medicines may be prescribed or recommended to relieve symptoms. Medicines may help:  Reduce your fever.  Reduce your cough.  Relieve nasal congestion. HOME CARE INSTRUCTIONS   Take medicines only as directed by your health care provider.   Gargle warm saltwater or take cough drops to comfort your throat as directed by your health care provider.  Use a warm mist humidifier or inhale steam from a shower to increase air moisture. This may make it easier to breathe.  Drink enough fluid to keep your urine clear or pale yellow.   Eat soups and other clear broths and maintain good nutrition.   Rest as needed.   Return to work when your temperature has returned to normal or  as your health care provider advises. You may need to stay home longer to avoid infecting others. You can also use a face mask and careful hand washing to prevent spread of the virus.  Increase the usage of your inhaler if you have asthma.   Do not use any tobacco products, including cigarettes, chewing tobacco, or electronic cigarettes. If you need help quitting, ask your health care provider. PREVENTION  The best way to protect yourself from getting a cold is to practice good hygiene.   Avoid oral or hand contact with people with cold symptoms.   Wash your hands often if contact occurs.  There is no clear evidence that vitamin C, vitamin E, echinacea, or exercise reduces the chance of developing a cold. However, it is always recommended to get plenty of rest, exercise, and practice good nutrition.  SEEK MEDICAL CARE IF:   You are getting worse rather than better.   Your symptoms are not controlled by medicine.   You have chills.  You have worsening shortness of breath.  You have brown or red mucus.  You have yellow or brown nasal discharge.  You have pain in your face, especially when you bend forward.  You have a fever.  You have swollen neck glands.  You have pain while swallowing.  You have white areas in the back of your throat. SEEK IMMEDIATE MEDICAL CARE IF:   You have severe or persistent:  Headache.  Ear pain.  Sinus pain.  Chest pain.  You have chronic lung disease and any of the following:  Wheezing.  Prolonged cough.  Coughing up blood.  A change in your usual mucus.  You have a stiff neck.  You have changes in your:  Vision.  Hearing.  Thinking.  Mood. MAKE SURE YOU:   Understand these instructions.  Will watch your condition.  Will get help right away if you are not doing well or get worse.   This information is not intended to replace advice given to you by your health care provider. Make sure you discuss any questions  you have with your health care provider.   Document Released: 12/20/2000 Document Revised: 11/10/2014 Document Reviewed: 10/01/2013 Elsevier Interactive Patient Education 2016 [place].   Asthma, Acute Bronchospasm Acute bronchospasm caused by asthma is also referred to as an asthma attack. Bronchospasm means your air passages become narrowed. The narrowing is caused by inflammation and tightening of the muscles in the air tubes (bronchi) in your lungs. This can make it hard to breathe or cause you to wheeze and cough. CAUSES Possible triggers are:  Animal dander from the skin, hair, or feathers of animals.  Dust mites contained in house dust.  Cockroaches.  Pollen from trees or grass.  Mold.  Cigarette or tobacco smoke.  Air pollutants such as dust, household cleaners, hair sprays, aerosol sprays, paint fumes, strong chemicals, or strong odors.  Cold air or weather changes. Cold air may trigger inflammation. Winds increase molds and pollens in the air.  Strong emotions such as crying or laughing hard.  Stress.  Certain medicines such as aspirin or beta-blockers.  Sulfites in foods and drinks, such as dried fruits and wine.  Infections or inflammatory conditions, such as a flu, cold, or inflammation of the nasal membranes (rhinitis).  Gastroesophageal reflux disease (GERD). GERD is a condition where stomach acid backs up into your esophagus.  Exercise or strenuous activity. SIGNS AND SYMPTOMS   Wheezing.  Excessive coughing, particularly at night.  Chest tightness.  Shortness of breath. DIAGNOSIS  Your health care provider will ask you about your medical history and perform a physical exam. A chest X-ray or blood testing may be performed to look for other  causes of your symptoms or other conditions that may have triggered your asthma attack. TREATMENT  Treatment is aimed at reducing inflammation and opening up the airways in your lungs. Most asthma attacks  are treated with inhaled medicines. These include quick relief or rescue medicines (such as bronchodilators) and controller medicines (such as inhaled corticosteroids). These medicines are sometimes given through an inhaler or a nebulizer. Systemic steroid medicine taken by mouth or given through an IV tube also can be used to reduce the inflammation when an attack is moderate or severe. Antibiotic medicines are only used if a bacterial infection is present.  HOME CARE INSTRUCTIONS   Rest.  Drink plenty of liquids. This helps the mucus to remain thin and be easily coughed up. Only use caffeine in moderation and do not use alcohol until you have recovered from your illness.  Do not smoke. Avoid being exposed to secondhand smoke.  You play a critical role in keeping yourself in good health. Avoid exposure to things that cause you to wheeze or to have breathing problems.  Keep your medicines up-to-date and available. Carefully follow your health care provider's treatment plan.  Take your medicine exactly as prescribed.  When pollen or pollution is bad, keep windows closed and use an air conditioner or go to places with air conditioning.  Asthma requires careful medical care. See your health care provider for a follow-up as advised. If you are more than [redacted] weeks pregnant and you were prescribed any new medicines, let your obstetrician know about the visit and how you are doing. Follow up with your health care provider as directed.  After you have recovered from your asthma attack, make an appointment with your outpatient doctor to talk about ways to reduce the likelihood of future attacks. If you do not have a doctor who manages your asthma, make an appointment with a primary care doctor to discuss your asthma. SEEK IMMEDIATE MEDICAL CARE IF:   You are getting worse.  You have trouble breathing. If severe, call your local emergency services (911 in the [place]  You develop chest pain or  discomfort.  You are vomiting.  You are not able to keep fluids down.  You are coughing up yellow, green, brown, or bloody sputum.  You have a fever and your symptoms suddenly get worse.  You have trouble swallowing. MAKE SURE YOU:   Understand these instructions.  Will watch your condition.  Will get help right away if you are not doing well or get worse.   This information is not intended to replace advice given to you by your health care provider. Make sure you discuss any questions you have with your health care provider.   Document Released: 10/11/2006 Document Revised: 07/01/2013 Document Reviewed: 01/01/2013 Elsevier Interactive Patient Education [employer].        I personally performed the services described in this documentation, which was scribed in my presence. The recorded information has been reviewed and considered, and addended by me as needed.

## 2015-08-24 ENCOUNTER — Ambulatory Visit (INDEPENDENT_AMBULATORY_CARE_PROVIDER_SITE_OTHER): Payer: BC Managed Care – PPO | Admitting: Internal Medicine

## 2015-08-24 DIAGNOSIS — E538 Deficiency of other specified B group vitamins: Secondary | ICD-10-CM

## 2015-08-24 MED ORDER — CYANOCOBALAMIN 1000 MCG/ML IJ SOLN
1000.0000 ug | Freq: Once | INTRAMUSCULAR | Status: AC
  Administered 2015-08-24: 1000 ug via INTRAMUSCULAR

## 2015-08-25 ENCOUNTER — Ambulatory Visit (INDEPENDENT_AMBULATORY_CARE_PROVIDER_SITE_OTHER): Payer: BC Managed Care – PPO | Admitting: Internal Medicine

## 2015-08-25 ENCOUNTER — Ambulatory Visit (INDEPENDENT_AMBULATORY_CARE_PROVIDER_SITE_OTHER): Payer: BC Managed Care – PPO

## 2015-08-25 ENCOUNTER — Encounter: Payer: Self-pay | Admitting: Internal Medicine

## 2015-08-25 VITALS — BP 122/75 | HR 85 | Temp 98.1°F | Resp 16 | Ht 67.0 in | Wt 184.0 lb

## 2015-08-25 DIAGNOSIS — E538 Deficiency of other specified B group vitamins: Secondary | ICD-10-CM

## 2015-08-25 DIAGNOSIS — Z0000 Encounter for general adult medical examination without abnormal findings: Secondary | ICD-10-CM | POA: Diagnosis not present

## 2015-08-25 DIAGNOSIS — K219 Gastro-esophageal reflux disease without esophagitis: Secondary | ICD-10-CM | POA: Diagnosis not present

## 2015-08-25 DIAGNOSIS — E119 Type 2 diabetes mellitus without complications: Secondary | ICD-10-CM

## 2015-08-25 DIAGNOSIS — R05 Cough: Secondary | ICD-10-CM

## 2015-08-25 DIAGNOSIS — I10 Essential (primary) hypertension: Secondary | ICD-10-CM

## 2015-08-25 DIAGNOSIS — E785 Hyperlipidemia, unspecified: Secondary | ICD-10-CM

## 2015-08-25 DIAGNOSIS — R059 Cough, unspecified: Secondary | ICD-10-CM

## 2015-08-25 LAB — CBC WITH DIFFERENTIAL/PLATELET
BASOS ABS: 0 10*3/uL (ref 0.0–0.1)
Basophils Relative: 0 % (ref 0–1)
EOS PCT: 9 % — AB (ref 0–5)
Eosinophils Absolute: 0.4 10*3/uL (ref 0.0–0.7)
HEMATOCRIT: 42 % (ref 39.0–52.0)
HEMOGLOBIN: 14.3 g/dL (ref 13.0–17.0)
LYMPHS PCT: 41 % (ref 12–46)
Lymphs Abs: 1.9 10*3/uL (ref 0.7–4.0)
MCH: 29 pg (ref 26.0–34.0)
MCHC: 34 g/dL (ref 30.0–36.0)
MCV: 85.2 fL (ref 78.0–100.0)
MPV: 11.7 fL (ref 8.6–12.4)
Monocytes Absolute: 0.3 10*3/uL (ref 0.1–1.0)
Monocytes Relative: 7 % (ref 3–12)
NEUTROS ABS: 2 10*3/uL (ref 1.7–7.7)
NEUTROS PCT: 43 % (ref 43–77)
Platelets: 229 10*3/uL (ref 150–400)
RBC: 4.93 MIL/uL (ref 4.22–5.81)
RDW: 13.5 % (ref 11.5–15.5)
WBC: 4.6 10*3/uL (ref 4.0–10.5)

## 2015-08-25 LAB — POCT GLYCOSYLATED HEMOGLOBIN (HGB A1C): HEMOGLOBIN A1C: 6.8

## 2015-08-25 MED ORDER — PREDNISONE 20 MG PO TABS: ORAL_TABLET | ORAL | Status: DC

## 2015-08-25 MED ORDER — ATORVASTATIN CALCIUM 20 MG PO TABS: 20.0000 mg | ORAL_TABLET | Freq: Every day | ORAL | Status: DC

## 2015-08-25 MED ORDER — AZITHROMYCIN 500 MG PO TABS: 500.0000 mg | ORAL_TABLET | Freq: Every day | ORAL | Status: DC

## 2015-08-25 NOTE — Progress Notes (Signed)
Subjective:    Patient ID: Gregory Collins, male    DOB: May 05, 1971, 45 y.o.   MRN: [id number]  [facility] Patient Active Problem List   Diagnosis Date Noted  . Airway hyperreactivity--has inhalers prn use.vs 08/03/2015  . Sinus tachycardia ([facility])--ok 06/29/2015  . Pernicious anemia---stable Dr Hilarie Fredrickson 03/29/2015  . Diabetes mellitus without complication ([facility])--stable p wt loss/no meds needed 11/10/2014  . Hyperlipidemia 11/10/2014  . Essential hypertension--rec card eval inc ETT stable 11/10/2014  . Migraine--occas 08/20/2011  . 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 He appears well-developed and well-nourished.  HENT:  Head: Normocephalic and atraumatic.  Right Ear: Hearing, tympanic membrane, external ear and ear canal normal.  Left Ear: Hearing, tympanic membrane, external ear and ear canal normal.  Nose: Nose normal.  Mouth/Throat: Uvula is midline, oropharynx is clear and moist and mucous membranes are normal.  Eyes: Conjunctivae, EOM and lids are normal. Pupils are equal, round, and reactive to light. Right eye exhibits no discharge. Left eye exhibits no discharge. No scleral icterus.  Neck: Trachea normal and normal range of motion. Neck supple. Carotid bruit is not present.  Cardiovascular: Normal rate, regular rhythm, normal heart sounds, intact distal pulses and normal pulses.   No murmur heard. Pulmonary/Chest: Effort normal. No respiratory distress. He has no rhonchi.  Rales R posterior  Abdominal: Soft. Normal appearance and bowel sounds are normal. He exhibits no  abdominal bruit. There is no tenderness.  Musculoskeletal: Normal range of motion. He exhibits no edema or tenderness.  Lymphadenopathy:       Head (right side): No submental, no submandibular, no tonsillar, no preauricular, no posterior auricular and no occipital adenopathy present.       Head (left side): No submental, no submandibular, no tonsillar, no preauricular, no posterior auricular and no occipital adenopathy present.    He has no cervical adenopathy.  Neurological: He is alert and oriented to person, place, and time. He has normal strength and normal reflexes. No cranial nerve deficit or sensory deficit. Coordination and gait normal.  Skin: Skin is warm, dry and intact. No lesion and no rash noted.  Psychiatric: He has a normal mood and affect. His speech is normal and behavior is normal. Judgment and thought content normal.    BP 122/75 mmHg  Pulse 85  Temp(Src) 98.1 F (36.7 C)  Resp 16  Ht 5' 7" (1.702 m)  Wt 184 lb (83.462 kg)  BMI 28.81 kg/m2   BP 122/75 mmHg  Pulse 85  Temp(Src) 98.1 F (36.7 C)  Resp 16  Ht 5' 7" (1.702 m)  Wt 184 lb (83.462 kg)  BMI 28.81 kg/m2 Wt Readings from Last 3 Encounters:  08/25/15 184 lb (83.462 kg)  08/19/15 183 lb (83.008 kg)  06/29/15 190 lb 4.8 oz (86.32 kg)    CXR RLL-upper=infiltrate  Results for orders placed or performed in visit on 08/25/15  CBC with Differential/Platelet  Result Value Ref Range   WBC 4.6 4.0 - 10.5 K/uL   RBC 4.93 4.22 - 5.81 MIL/uL   Hemoglobin 14.3 13.0 - 17.0 g/dL   HCT 42.0 39.0 - 52.0 %  MCV 85.2 78.0 - 100.0 fL   MCH 29.0 26.0 - 34.0 pg   MCHC 34.0 30.0 - 36.0 g/dL   RDW 13.5 11.5 - 15.5 %   Platelets 229 150 - 400 K/uL   MPV 11.7 8.6 - 12.4 fL   Neutrophils Relative % 43 43 - 77 %   Neutro Abs 2.0 1.7 - 7.7 K/uL   Lymphocytes Relative 41 12 - 46 %   Lymphs Abs 1.9 0.7 - 4.0 K/uL   Monocytes Relative 7 3 - 12 %   Monocytes Absolute 0.3 0.1 - 1.0 K/uL   Eosinophils Relative 9 (H) 0 -  5 %   Eosinophils Absolute 0.4 0.0 - 0.7 K/uL   Basophils Relative 0 0 - 1 %   Basophils Absolute 0.0 0.0 - 0.1 K/uL   Smear Review Criteria for review not met   POCT glycosylated hemoglobin (Hb A1C)  Result Value Ref Range   Hemoglobin A1C 6.8        Assessment & Plan:  Annual physical exam   Diabetes mellitus without complication ([place]) - Plan: POCT glycosylated hemoglobin (Hb A1C) as above -Continue weight loss and proper diet/increase exercise  Hyperlipidemia - Plan: Lipid panel  Essential hypertension - Plan: CBC with Differential/Platelet, Comprehensive metabolic panel  T09 deficiency-stable ---Dr Hilarie Fredrickson  Gastroesophageal reflux disease without esophagitis  First-degree relative with prostate cancer  Cough - pneumonia with reactive airway disease --Zithromax and prednisone --Continue inhalers  Meds ordered this encounter  Medications  . azithromycin (ZITHROMAX) 500 MG tablet    Sig: Take 1 tablet (500 mg total) by mouth daily.    Dispense:  5 tablet    Refill:  0  . predniSONE (DELTASONE) 20 MG tablet    Sig: 3/3/3/2/2/2/1/1/1 single daily dose for 9 days    Dispense:  18 tablet    Refill:  0  . atorvastatin (LIPITOR) 20 MG tablet    Sig: Take 1 tablet (20 mg total) by mouth daily.    Dispense:  90 tablet    Refill:  3  call for other refills 1 year  Follow-up to see me in 5 weeks for repeat chest x-ray

## 2015-08-25 NOTE — Patient Instructions (Signed)
Because you received an x-ray today, you will receive an invoice from [facility]. Please contact [facility] at [phone] with questions or concerns regarding your invoice. Our billing staff will not be able to assist you with those questions. °

## 2015-08-26 LAB — COMPREHENSIVE METABOLIC PANEL
ALK PHOS: 61 U/L (ref 40–115)
ALT: 36 U/L (ref 9–46)
AST: 30 U/L (ref 10–40)
Albumin: 4.3 g/dL (ref 3.6–5.1)
BUN: 15 mg/dL (ref 7–25)
CO2: 26 mmol/L (ref 20–31)
CREATININE: 1 mg/dL (ref 0.60–1.35)
Calcium: 9 mg/dL (ref 8.6–10.3)
Chloride: 101 mmol/L (ref 98–110)
GLUCOSE: 123 mg/dL — AB (ref 65–99)
Potassium: 3.9 mmol/L (ref 3.5–5.3)
SODIUM: 138 mmol/L (ref 135–146)
TOTAL PROTEIN: 7.5 g/dL (ref 6.1–8.1)
Total Bilirubin: 0.4 mg/dL (ref 0.2–1.2)

## 2015-08-26 LAB — LIPID PANEL
CHOLESTEROL: 158 mg/dL (ref 125–200)
HDL: 27 mg/dL — ABNORMAL LOW (ref >=40)
LDL Cholesterol: 88 mg/dL (ref <130)
Total CHOL/HDL Ratio: 5.9 Ratio — ABNORMAL HIGH (ref <=5.0)
Triglycerides: 214 mg/dL — ABNORMAL HIGH (ref <150)
VLDL: 43 mg/dL — ABNORMAL HIGH (ref <30)

## 2015-08-30 ENCOUNTER — Encounter: Payer: Self-pay | Admitting: Internal Medicine

## 2015-09-08 ENCOUNTER — Ambulatory Visit (INDEPENDENT_AMBULATORY_CARE_PROVIDER_SITE_OTHER): Payer: BC Managed Care – PPO | Admitting: Internal Medicine

## 2015-09-08 DIAGNOSIS — E538 Deficiency of other specified B group vitamins: Secondary | ICD-10-CM

## 2015-09-08 MED ORDER — CYANOCOBALAMIN 1000 MCG/ML IJ SOLN
1000.0000 ug | INTRAMUSCULAR | Status: AC
  Administered 2015-09-08 – 2015-10-06 (×3): 1000 ug via INTRAMUSCULAR

## 2015-09-22 ENCOUNTER — Ambulatory Visit (INDEPENDENT_AMBULATORY_CARE_PROVIDER_SITE_OTHER): Payer: BC Managed Care – PPO | Admitting: Internal Medicine

## 2015-09-22 DIAGNOSIS — E538 Deficiency of other specified B group vitamins: Secondary | ICD-10-CM | POA: Diagnosis not present

## 2015-10-06 ENCOUNTER — Ambulatory Visit (INDEPENDENT_AMBULATORY_CARE_PROVIDER_SITE_OTHER): Payer: BC Managed Care – PPO | Admitting: Internal Medicine

## 2015-10-06 DIAGNOSIS — E538 Deficiency of other specified B group vitamins: Secondary | ICD-10-CM

## 2015-10-18 ENCOUNTER — Telehealth: Payer: Self-pay

## 2015-10-18 NOTE — Telephone Encounter (Signed)
Pt aware.

## 2015-10-18 NOTE — Telephone Encounter (Signed)
-----   Message from Algernon Huxley, RN sent at 08/04/2015  1:33 PM EST ----- Regarding: B12 Pt needs labs in April, order in epic.

## 2015-10-20 ENCOUNTER — Ambulatory Visit (INDEPENDENT_AMBULATORY_CARE_PROVIDER_SITE_OTHER): Payer: BC Managed Care – PPO | Admitting: Internal Medicine

## 2015-10-20 ENCOUNTER — Ambulatory Visit (INDEPENDENT_AMBULATORY_CARE_PROVIDER_SITE_OTHER): Payer: BC Managed Care – PPO

## 2015-10-20 ENCOUNTER — Other Ambulatory Visit (INDEPENDENT_AMBULATORY_CARE_PROVIDER_SITE_OTHER): Payer: BC Managed Care – PPO

## 2015-10-20 ENCOUNTER — Encounter: Payer: Self-pay | Admitting: Internal Medicine

## 2015-10-20 VITALS — BP 127/76 | HR 93 | Temp 98.3°F | Resp 16 | Ht 67.0 in | Wt 193.0 lb

## 2015-10-20 DIAGNOSIS — R938 Abnormal findings on diagnostic imaging of other specified body structures: Secondary | ICD-10-CM

## 2015-10-20 DIAGNOSIS — R9389 Abnormal findings on diagnostic imaging of other specified body structures: Secondary | ICD-10-CM

## 2015-10-20 DIAGNOSIS — G320 Subacute combined degeneration of spinal cord in diseases classified elsewhere: Secondary | ICD-10-CM | POA: Diagnosis not present

## 2015-10-20 DIAGNOSIS — E538 Deficiency of other specified B group vitamins: Secondary | ICD-10-CM

## 2015-10-20 LAB — VITAMIN B12: Vitamin B-12: 312 pg/mL (ref 211–911)

## 2015-10-20 NOTE — Patient Instructions (Signed)
     IF you received an x-ray today, you will receive an invoice from [facility]. Please contact [facility] at [phone] with questions or concerns regarding your invoice.   IF you received labwork today, you will receive an invoice from [facility]. Please contact [facility] at [phone] with questions or concerns regarding your invoice.   Our billing staff will not be able to assist you with questions regarding bills from these companies.  You will be contacted with the lab results as soon as they are available. The fastest way to get your results is to activate your My Chart account. Instructions are located on the last page of this paperwork. If you have not heard from us regarding the results in 2 weeks, please contact this office.      

## 2015-10-21 ENCOUNTER — Ambulatory Visit (INDEPENDENT_AMBULATORY_CARE_PROVIDER_SITE_OTHER): Payer: BC Managed Care – PPO | Admitting: Internal Medicine

## 2015-10-21 DIAGNOSIS — E538 Deficiency of other specified B group vitamins: Secondary | ICD-10-CM

## 2015-10-21 MED ORDER — CYANOCOBALAMIN 1000 MCG/ML IJ SOLN
1000.0000 ug | Freq: Once | INTRAMUSCULAR | Status: AC
  Administered 2015-10-21: 1000 ug via INTRAMUSCULAR

## 2015-10-22 NOTE — Progress Notes (Signed)
F/u for abnormal CXR  IMPRESSION: 08/25/15 Subtle increased density in the right perihilar region may reflect atelectasis or pneumonia. An underlying mass is not absolutely Excluded. Treated:Cough - pneumonia with reactive airway disease --Zithromax and prednisone --Continue inhalers Sxt have resolved  ROS neg today Other probs stable  Exam BP 127/76 mmHg  Pulse 93  Temp(Src) 98.3 F (36.8 C)  Resp 16  Ht 5\' 7"  (1.702 m)  Wt 193 lb (87.544 kg)  BMI 30.22 kg/m2 Lungs clear  COMPARISON: Chest x-ray of 08/25/2015 done today=  FINDINGS: No active infiltrate or effusion is seen. The area questioned on prior chest x-ray appears to have represented overlapping vasculature. Mediastinal and hilar contours are unremarkable. The heart is within normal limits in size. No bony abnormality is seen  Imp #1 resolved pneumonia  Reassured  Follow-up is planned with new primary care provider

## 2015-11-04 ENCOUNTER — Ambulatory Visit (INDEPENDENT_AMBULATORY_CARE_PROVIDER_SITE_OTHER): Payer: BC Managed Care – PPO | Admitting: Internal Medicine

## 2015-11-04 DIAGNOSIS — E538 Deficiency of other specified B group vitamins: Secondary | ICD-10-CM

## 2015-11-04 MED ORDER — CYANOCOBALAMIN 1000 MCG/ML IJ SOLN
1000.0000 ug | Freq: Once | INTRAMUSCULAR | Status: AC
  Administered 2015-11-04: 1000 ug via INTRAMUSCULAR

## 2015-11-11 ENCOUNTER — Ambulatory Visit (INDEPENDENT_AMBULATORY_CARE_PROVIDER_SITE_OTHER): Payer: BC Managed Care – PPO | Admitting: Cardiovascular Disease

## 2015-11-11 ENCOUNTER — Encounter: Payer: Self-pay | Admitting: Cardiovascular Disease

## 2015-11-11 VITALS — BP 123/68 | HR 95 | Ht 67.0 in | Wt 193.8 lb

## 2015-11-11 DIAGNOSIS — R000 Tachycardia, unspecified: Secondary | ICD-10-CM | POA: Diagnosis not present

## 2015-11-11 DIAGNOSIS — I10 Essential (primary) hypertension: Secondary | ICD-10-CM

## 2015-11-11 MED ORDER — METOPROLOL TARTRATE 25 MG PO TABS: 25.0000 mg | ORAL_TABLET | Freq: Two times a day (BID) | ORAL | Status: DC

## 2015-11-11 NOTE — Progress Notes (Signed)
Cardiology Office Note   Date:  11/14/2015   ID:  [name], DOB 03-24-1971, MRN [id number]  PCP:  Leandrew Koyanagi, MD  Cardiologist:   Skeet Latch, MD   Chief Complaint  Patient presents with  . Follow-up    pt c/o SOB when walking a long distance     Patient ID: Gregory Collins is a 45 y.o. male who presents with hypertension, hyperlipidemia and diabetes type 2 who presents for follow up. Mr. Urben was initially evaluated for palpitations and elevated heart rates.  He wore a 24 hour Holter 03/2015 which showed an average heart rate of 99 bpm.  He limited caffeine intake and started metoprolol which helped somewhat. He reported intermittent episodes of L sided chest pain and had an ETT on 05/21/15 that was negative for ischemia.  He reached 105% of his predicted max heart rate.  Since then he has been doing well but he notes dyspnea on exertion when walking across campus.  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  He hasn't noticed this when walking around the house.  He reports that he has gained weight and wonders if this may be causing his dyspnea on exertion.  He also has asthma and uses his inhalers intermittently.  He occasionally notes wheezing.   Past Medical History  Diagnosis Date  . Asthma   . GERD (gastroesophageal reflux disease)   . Diabetes mellitus without complication (Gregory Collins)   . Hypertension   . Hyperlipidemia   . Sinus tachycardia (Gregory Collins) 06/29/2015    Past Surgical History  Procedure Laterality Date  . Leg surgery      infant     Current Outpatient Prescriptions  Medication Sig Dispense Refill  . albuterol (PROAIR HFA) 108 (90 Base) MCG/ACT inhaler     . aspirin EC 81 MG tablet Take by mouth.    Marland Kitchen atorvastatin (LIPITOR) 20 MG tablet Take 1 tablet (20 mg total) by mouth daily. 90 tablet 3  . beclomethasone (QVAR) 40 MCG/ACT inhaler Inhale into the lungs.     . cyanocobalamin (,VITAMIN B-12,) 1000 MCG/ML injection     . lansoprazole (PREVACID) 30 MG capsule Take by mouth.    Marland Kitchen lisinopril (PRINIVIL,ZESTRIL) 10 MG tablet     . metoprolol tartrate (LOPRESSOR) 25 MG tablet Take 1 tablet (25 mg total) by mouth 2 (two) times daily. 180 tablet 3   No current facility-administered medications for this visit.    Allergies:   Review of patient's allergies indicates no known allergies.    Social History:  The patient  reports that he has never smoked. He has never used smokeless tobacco. He reports that he drinks about 1.8 - 2.4 oz of alcohol per week. He reports that he does not use illicit drugs.   Family History:  The patient's family history includes Diabetes in his father and mother; Kidney disease in his mother; Prostate cancer in his father.    ROS:  Please see the history of present illness.   Otherwise, review of systems are positive for none.   All other systems are reviewed and negative.    PHYSICAL EXAM: VS:  BP 123/68 mmHg  Pulse 95  Ht 5\' 7"  (1.702 m)  Wt 87.907 kg (193 lb 12.8 oz)  BMI 30.35 kg/m2 , BMI Body mass index is 30.35 kg/(m^2). GENERAL:  Well appearing HEENT:  Pupils equal round and reactive, fundi  not visualized, oral mucosa unremarkable NECK:  No jugular venous distention, waveform within normal limits, carotid upstroke brisk and symmetric, no bruits LYMPHATICS:  No cervical adenopathy LUNGS:  Clear to auscultation bilaterally HEART:  RRR.  PMI not displaced or sustained,S1 and S2 within normal limits, no S3, no S4, no clicks, no rubs, no murmurs ABD:  Flat, positive bowel sounds normal in frequency in pitch, no bruits, no rebound, no guarding, no midline pulsatile mass, no hepatomegaly, no splenomegaly EXT:  2 plus pulses throughout, no edema, no cyanosis no clubbing SKIN:  No rashes no nodules NEURO:  Cranial nerves II through XII grossly intact, motor grossly intact throughout PSYCH:  Cognitively intact, oriented  to person place and time  EKG:  EKG is not ordered today.   24 Hour Holter 03/29/15:  Quality: Fair. Baseline artifact. Predominant rhythm: sinus rhythm Average heart rate: 99 bpm Max heart rate: 141 bpm Min heart rate: 65 bpm Pauses >2.5 seconds: 0 Ventricular ectopics: 0  Supraventricular ectopics: 0 Patient did not submit a symptom diary   Recent Labs: 08/25/2015: ALT 36; BUN 15; Creat 1.00; Hemoglobin 14.3; Platelets 229; Potassium 3.9; Sodium 138    Lipid Panel    Component Value Date/Time   CHOL 158 08/25/2015 1316   TRIG 214* 08/25/2015 1316   HDL 27* 08/25/2015 1316   CHOLHDL 5.9* 08/25/2015 1316   VLDL 43* 08/25/2015 1316   LDLCALC 88 08/25/2015 1316      Wt Readings from Last 3 Encounters:  11/11/15 87.907 kg (193 lb 12.8 oz)  10/20/15 87.544 kg (193 lb)  08/25/15 83.462 kg (184 lb)    Other studies Reviewed: Additional studies/ records that were reviewed today include:.Review of the above records demonstrates:  Please see elsewhere in the note.    ETT 05/21/15:     Blood pressure demonstrated a hypertensive response to exercise.  There was no ST segment deviation noted during stress.  Negative, adequate stress test. Excellent exercise capacity.  ASSESSMENT AND PLAN:  # Shortness of breath: [name] has no evidence of heart failure on exam and his stress test was negative for ischemia.  It is likely that his shortness of breath is attributable to weight gain and deconditioning or asthma.  He only uses Qvar as needed, though this is typically a preventative medicine.  He will start taking it daily.  Additionally, his heart rate remains elevated and increased to 187 bpm with exercise.  These elevated heart rates with exertion may also contribute to his dyspnea.  We will increase metoprolol to 25 mg bid for improved heart rates.  I recommended that he increase his exercise to at least 30-40 minutes most days of the week.    # Hypertension: BP is  well-controlled.  Increase metoprolol due to tachycardia.  If his BP gets too low we can reduce lisinopril.   # CV Disease Prevention: ASCVD 10 year risk is 9.8%.  Continue aspirin 81 mg daily.  # Sinus tachycardia: Resolved after starting metoprolol and reducing his coffee intake. His palpitations have completely resolved.  I suspect that his HR gets high with exertion.  We will increase metoprolol as above.  # Hyperlipidemia: Managed by PCP.  Continue atorvastatin.  Current medicines are reviewed at length with the patient today.  The patient does not have concerns regarding medicines.  The following changes have been made:  Increase metoprolol.  Labs/ tests ordered today include:   No orders of the defined types were placed in this encounter.  Time spent: 30 minutes-Greater than 50% of this time was spent in counseling, explanation of diagnosis, planning of further management, and coordination of care.   Disposition:   FU with Dr. Jonelle Sidle C. Poston in 1 month.  Signed, Skeet Latch, MD  11/14/2015 12:03 AM    [place]

## 2015-11-11 NOTE — Patient Instructions (Addendum)
Medication Instructions:  INCREASE YOUR METOPROLOL TO 25 MG TWICE A DAY   START USING QVAR DAILY   Labwork: NONE  Testing/Procedures: NONE  Follow-Up: Your physician recommends that you schedule a follow-up appointment in: 1 MONTH OV  If you need a refill on your cardiac medications before your next appointment, please call your pharmacy.

## 2015-11-14 ENCOUNTER — Encounter: Payer: Self-pay | Admitting: Cardiovascular Disease

## 2015-11-16 ENCOUNTER — Ambulatory Visit (INDEPENDENT_AMBULATORY_CARE_PROVIDER_SITE_OTHER): Payer: BC Managed Care – PPO | Admitting: Allergy and Immunology

## 2015-11-16 ENCOUNTER — Encounter: Payer: Self-pay | Admitting: Allergy and Immunology

## 2015-11-16 VITALS — BP 120/72 | HR 64 | Resp 18

## 2015-11-16 DIAGNOSIS — J4530 Mild persistent asthma, uncomplicated: Secondary | ICD-10-CM

## 2015-11-16 DIAGNOSIS — J3089 Other allergic rhinitis: Secondary | ICD-10-CM | POA: Insufficient documentation

## 2015-11-16 MED ORDER — BECLOMETHASONE DIPROPIONATE 40 MCG/ACT IN AERS: 1.0000 | INHALATION_SPRAY | Freq: Two times a day (BID) | RESPIRATORY_TRACT | Status: DC

## 2015-11-16 MED ORDER — ALBUTEROL SULFATE HFA 108 (90 BASE) MCG/ACT IN AERS: 2.0000 | INHALATION_SPRAY | RESPIRATORY_TRACT | Status: DC | PRN

## 2015-11-16 NOTE — Assessment & Plan Note (Signed)
   Continue appropriate allergen avoidance measures and over-the-counter antihistamines as needed.

## 2015-11-16 NOTE — Patient Instructions (Addendum)
Mild persistent asthma Well-controlled, we will stepdown therapy at this time.  Decrease Qvar 40 g to one inhalation twice a day.  If lower respiratory symptoms progress in frequency and/or severity, the patient is to resume the previous dose.  To maximize pulmonary deposition, a spacer has been provided along with instructions for its proper administration with an HFA inhaler.  Subjective and objective measures of pulmonary function will be followed and the treatment plan will be adjusted accordingly.  Allergic rhinitis  Continue appropriate allergen avoidance measures and over-the-counter antihistamines as needed.    Return in about 6 months (around 05/18/2016), or if symptoms worsen or fail to improve.

## 2015-11-16 NOTE — Assessment & Plan Note (Signed)
Well-controlled, we will stepdown therapy at this time.  Decrease Qvar 40 g to one inhalation twice a day.  If lower respiratory symptoms progress in frequency and/or severity, the patient is to resume the previous dose.  To maximize pulmonary deposition, a spacer has been provided along with instructions for its proper administration with an HFA inhaler.  Subjective and objective measures of pulmonary function will be followed and the treatment plan will be adjusted accordingly.

## 2015-11-16 NOTE — Progress Notes (Signed)
Follow-up Note  RE: Gregory Collins MRN: [id number] DOB: [birth year] Date of Office Visit: 11/16/2015  Primary care provider: Leandrew Koyanagi, MD Referring provider: Leandrew Koyanagi, MD  History of present illness: HPI Comments: Gregory Collins is a 45 y.o. male with asthma, allergic rhinitis and gastroesophageal reflux presenting today for follow up.  He is previously seen in this office in June 2015.  Regarding his allergic rhinitis and asthma symptoms he has been "feeling great."  Over the past several months he has not required rescue medication, experienced nocturnal awakenings due to lower respiratory symptoms, nor have activities of daily living been limited.  He is currently using Qvar 40 g, 2 inhalations twice a day without a spacer device.  He has no nasal symptom complaints today.   Assessment and plan: Mild persistent asthma Well-controlled, we will stepdown therapy at this time.  Decrease Qvar 40 g to one inhalation twice a day.  If lower respiratory symptoms progress in frequency and/or severity, the patient is to resume the previous dose.  To maximize pulmonary deposition, a spacer has been provided along with instructions for its proper administration with an HFA inhaler.  Subjective and objective measures of pulmonary function will be followed and the treatment plan will be adjusted accordingly.  Allergic rhinitis  Continue appropriate allergen avoidance measures and over-the-counter antihistamines as needed.    Meds ordered this encounter  Medications  . albuterol (PROAIR HFA) 108 (90 Base) MCG/ACT inhaler    Sig: Inhale 2 puffs into the lungs every 4 (four) hours as needed for wheezing or shortness of breath.    Dispense:  1 Inhaler    Refill:  1  . beclomethasone (QVAR) 40 MCG/ACT inhaler    Sig: Inhale 1 puff into the lungs 2 (two) times daily.    Dispense:  1 Inhaler    Refill:  5    Diagnositics: Spirometry:  Normal with an FEV1 of 102%  predicted.  Please see scanned spirometry results for details.    Physical examination: Blood pressure 120/72, pulse 64, resp. rate 18.  General: Alert, interactive, in no acute distress. HEENT: TMs pearly gray, turbinates minimally edematous without discharge, post-pharynx mildly erythematous. Neck: Supple without lymphadenopathy. Lungs: Clear to auscultation without wheezing, rhonchi or rales. CV: Normal S1, S2 without murmurs. Skin: Warm and dry, without lesions or rashes.  The following portions of the patient's history were reviewed and updated as appropriate: allergies, current medications, past family history, past medical history, past social history, past surgical history and problem list.    Medication List       This list is accurate as of: 11/16/15  6:44 PM.  Always use your most recent med list.               albuterol 108 (90 Base) MCG/ACT inhaler  Commonly known as:  PROAIR HFA  Inhale 2 puffs into the lungs every 4 (four) hours as needed for wheezing or shortness of breath.     aspirin EC 81 MG tablet  Take by mouth.     atorvastatin 20 MG tablet  Commonly known as:  LIPITOR  Take 1 tablet (20 mg total) by mouth daily.     beclomethasone 40 MCG/ACT inhaler  Commonly known as:  QVAR  Inhale 1 puff into the lungs 2 (two) times daily.     cyanocobalamin 1000 MCG/ML injection  Commonly known as:  (VITAMIN B-12)     lansoprazole 30 MG capsule  Commonly known as:  PREVACID  Take by mouth.     lisinopril 10 MG tablet  Commonly known as:  PRINIVIL,ZESTRIL     metoprolol tartrate 25 MG tablet  Commonly known as:  LOPRESSOR  Take 1 tablet (25 mg total) by mouth 2 (two) times daily.        No Known Allergies  I appreciate the opportunity to take part in this Gregory Collins's care. Please do not hesitate to contact me with questions.  Sincerely,   R. Edgar Frisk, MD

## 2015-11-18 ENCOUNTER — Encounter: Payer: Self-pay | Admitting: Allergy and Immunology

## 2015-11-25 ENCOUNTER — Ambulatory Visit (INDEPENDENT_AMBULATORY_CARE_PROVIDER_SITE_OTHER): Payer: BC Managed Care – PPO | Admitting: Internal Medicine

## 2015-11-25 DIAGNOSIS — E538 Deficiency of other specified B group vitamins: Secondary | ICD-10-CM

## 2015-11-25 MED ORDER — CYANOCOBALAMIN 1000 MCG/ML IJ SOLN
1000.0000 ug | Freq: Once | INTRAMUSCULAR | Status: AC
  Administered 2015-11-25: 1000 ug via INTRAMUSCULAR

## 2015-12-16 ENCOUNTER — Telehealth: Payer: Self-pay

## 2015-12-16 ENCOUNTER — Ambulatory Visit (INDEPENDENT_AMBULATORY_CARE_PROVIDER_SITE_OTHER): Payer: BC Managed Care – PPO | Admitting: Internal Medicine

## 2015-12-16 DIAGNOSIS — E538 Deficiency of other specified B group vitamins: Secondary | ICD-10-CM

## 2015-12-16 MED ORDER — CYANOCOBALAMIN 1000 MCG/ML IJ SOLN
1000.0000 ug | Freq: Once | INTRAMUSCULAR | Status: AC
  Administered 2015-12-16: 1000 ug via INTRAMUSCULAR

## 2015-12-16 NOTE — Telephone Encounter (Signed)
Left message for patient that he had missed today's b12 injection.  I told him he could come by any time today and I could give it to him.  I instructed him to call and let me know.

## 2015-12-20 NOTE — Progress Notes (Signed)
Cardiology Office Note   Date:  12/21/2015   ID:  PELHAM SAILE, DOB 07-Jan-1971, MRN [id number]  PCP:  Leandrew Koyanagi, MD  Cardiologist:   Skeet Latch, MD   Chief Complaint  Patient presents with  . Follow-up    1 month  pt states no Sx.     Patient ID: Gregory Collins is a 45 y.o. male who presents with hypertension, hyperlipidemia and diabetes type 2 who presents for follow up. 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  He wore a 24 hour Holter 03/2015 which showed an average heart rate of 99 bpm.  He limited caffeine intake and started metoprolol which helped somewhat. He reported intermittent episodes of L sided chest pain and had an ETT on 05/21/15 that was negative for ischemia.  He reached 105% of his predicted max heart rate.  He continued to report dyspnea on exertion so he was encouraged to increase his exercise. Metoprolol was also increased to 25 mg twice daily for improved heart rate control.  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 He is started exercising more and denies any shortness of breath or chest pain with exertion. He started trying to exercise at least a couple times a week. He thinks that he'll be able to exercise more now that school is out for the summer. He has not noted any lower extremity edema, orthopnea, or PND.  Past Medical History  Diagnosis Date  . Asthma   . GERD (gastroesophageal reflux disease)   . Diabetes mellitus without complication ([place])   . Hypertension   . Hyperlipidemia   . Sinus tachycardia ([place]) 06/29/2015    Past Surgical History  Procedure Laterality Date  . Leg surgery      infant     Current Outpatient Prescriptions  Medication Sig Dispense Refill  . albuterol (PROAIR HFA) 108 (90 Base) MCG/ACT inhaler Inhale 2 puffs into the lungs every 4 (four) hours as needed for wheezing or shortness of breath. 1 Inhaler 1  . aspirin EC 81 MG tablet Take by mouth.     Marland Kitchen atorvastatin (LIPITOR) 20 MG tablet Take 1 tablet (20 mg total) by mouth daily. 90 tablet 3  . beclomethasone (QVAR) 40 MCG/ACT inhaler Inhale 1 puff into the lungs 2 (two) times daily. 1 Inhaler 5  . cyanocobalamin (,VITAMIN B-12,) 1000 MCG/ML injection     . lansoprazole (PREVACID) 30 MG capsule Take by mouth.    Marland Kitchen lisinopril (PRINIVIL,ZESTRIL) 10 MG tablet     . metoprolol tartrate (LOPRESSOR) 25 MG tablet Take 1 tablet (25 mg total) by mouth 2 (two) times daily. 180 tablet 3   No current facility-administered medications for this visit.    Allergies:   Review of patient's allergies indicates no known allergies.    Social History:  The patient  reports that he has never smoked. He has never used smokeless tobacco. He reports that he drinks about 1.8 - 2.4 oz of alcohol per week. He reports that he does not use illicit drugs.   Family History:  The patient's family history includes Diabetes in his father and mother; Kidney disease in his mother; Prostate cancer in his father.    ROS:  Please see the history of present illness.   Otherwise, review of systems are positive for none.   All other systems are reviewed and negative.    PHYSICAL EXAM: VS:  BP 130/82 mmHg  Pulse 72  Ht  5\' 7"  (1.702 m)  Wt 194 lb 12.8 oz (88.361 kg)  BMI 30.50 kg/m2 , BMI Body mass index is 30.5 kg/(m^2). GENERAL:  Well appearing HEENT:  Pupils equal round and reactive, fundi not visualized, oral mucosa unremarkable NECK:  No jugular venous distention, waveform within normal limits, carotid upstroke brisk and symmetric, no bruits LYMPHATICS:  No cervical adenopathy LUNGS:  Clear to auscultation bilaterally HEART:  RRR.  PMI not displaced or sustained,S1 and S2 within normal limits, no S3, no S4, no clicks, no rubs, no murmurs ABD:  Flat, positive bowel sounds normal in frequency in pitch, no bruits, no rebound, no guarding, no midline pulsatile mass, no hepatomegaly, no splenomegaly EXT:  2 plus  pulses throughout, no edema, no cyanosis no clubbing SKIN:  No rashes no nodules NEURO:  Cranial nerves II through XII grossly intact, motor grossly intact throughout PSYCH:  Cognitively intact, oriented to person place and time  EKG:  EKG is ordered today. 6/713/17: Sinus rhythm. Rate 72 bpm. Inferior T wave inversions. Lateral T wave flattening.  24 Hour Holter 03/29/15:  Quality: Fair. Baseline artifact. Predominant rhythm: sinus rhythm Average heart rate: 99 bpm Max heart rate: 141 bpm Min heart rate: 65 bpm Pauses >2.5 seconds: 0 Ventricular ectopics: 0  Supraventricular ectopics: 0 Patient did not submit a symptom diary  Recent Labs: 08/25/2015: ALT 36; BUN 15; Creat 1.00; Hemoglobin 14.3; Platelets 229; Potassium 3.9; Sodium 138    Lipid Panel    Component Value Date/Time   CHOL 158 08/25/2015 1316   TRIG 214* 08/25/2015 1316   HDL 27* 08/25/2015 1316   CHOLHDL 5.9* 08/25/2015 1316   VLDL 43* 08/25/2015 1316   LDLCALC 88 08/25/2015 1316      Wt Readings from Last 3 Encounters:  12/21/15 194 lb 12.8 oz (88.361 kg)  11/11/15 193 lb 12.8 oz (87.907 kg)  10/20/15 193 lb (87.544 kg)    Other studies Reviewed: Additional studies/ records that were reviewed today include:.Review of the above records demonstrates:  Please see elsewhere in the note.    ETT 05/21/15:    Blood pressure demonstrated a hypertensive response to exercise.  There was no ST segment deviation noted during stress.  Negative, adequate stress test. Excellent exercise capacity.  ASSESSMENT AND PLAN:  # Shortness of breath: Symptoms have improved with increased exercise and increasing the dose of his metoprolol.  # Hypertension: BP is well-controlled.  Continue lisinopril and metoprolol.  # CV Disease Prevention: ASCVD 10 year risk is 9.8%.  Continue aspirin 81 mg daily.  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  # Hyperlipidemia: Managed by PCP.  Continue  atorvastatin.  # Obesity:  We discussed the importance of exercising at least 30-40 minutes most days of the week. He expressed understanding.   Current medicines are reviewed at length with the patient today.  The patient does not have concerns regarding medicines.  The following changes have been made: No chagnes  Labs/ tests ordered today include:   Orders Placed This Encounter  Procedures  . EKG 12-Lead    Time spent: 30 minutes-Greater than 50% of this time was spent in counseling, explanation of diagnosis, planning of further management, and coordination of care.   Disposition:   FU with Dr. Jonelle Sidle C. Oval Linsey in 1 year  Signed, Skeet Latch, MD  12/21/2015 5:03 PM    [place]

## 2015-12-21 ENCOUNTER — Encounter: Payer: Self-pay | Admitting: Cardiovascular Disease

## 2015-12-21 ENCOUNTER — Ambulatory Visit (INDEPENDENT_AMBULATORY_CARE_PROVIDER_SITE_OTHER): Payer: BC Managed Care – PPO | Admitting: Cardiovascular Disease

## 2015-12-21 VITALS — BP 130/82 | HR 72 | Ht 67.0 in | Wt 194.8 lb

## 2015-12-21 DIAGNOSIS — R000 Tachycardia, unspecified: Secondary | ICD-10-CM

## 2015-12-21 DIAGNOSIS — E785 Hyperlipidemia, unspecified: Secondary | ICD-10-CM | POA: Diagnosis not present

## 2015-12-21 DIAGNOSIS — R002 Palpitations: Secondary | ICD-10-CM | POA: Diagnosis not present

## 2015-12-21 DIAGNOSIS — I10 Essential (primary) hypertension: Secondary | ICD-10-CM | POA: Diagnosis not present

## 2015-12-21 NOTE — Patient Instructions (Signed)
Your physician wants you to follow-up in: [place] will receive a reminder letter in the mail two months in advance. If you don't receive a letter, please call our office to schedule the follow-up appointment.   If you need a refill on your cardiac medications before your next appointment, please call your pharmacy.

## 2016-01-06 ENCOUNTER — Ambulatory Visit (INDEPENDENT_AMBULATORY_CARE_PROVIDER_SITE_OTHER): Payer: BC Managed Care – PPO | Admitting: Internal Medicine

## 2016-01-06 ENCOUNTER — Ambulatory Visit (INDEPENDENT_AMBULATORY_CARE_PROVIDER_SITE_OTHER): Payer: BC Managed Care – PPO | Admitting: Family Medicine

## 2016-01-06 ENCOUNTER — Encounter: Payer: Self-pay | Admitting: Family Medicine

## 2016-01-06 VITALS — BP 118/66 | HR 81 | Temp 98.5°F | Ht 67.75 in | Wt 192.0 lb

## 2016-01-06 DIAGNOSIS — I10 Essential (primary) hypertension: Secondary | ICD-10-CM

## 2016-01-06 DIAGNOSIS — E119 Type 2 diabetes mellitus without complications: Secondary | ICD-10-CM

## 2016-01-06 DIAGNOSIS — E785 Hyperlipidemia, unspecified: Secondary | ICD-10-CM

## 2016-01-06 DIAGNOSIS — E538 Deficiency of other specified B group vitamins: Secondary | ICD-10-CM | POA: Diagnosis not present

## 2016-01-06 LAB — POCT GLYCOSYLATED HEMOGLOBIN (HGB A1C): Hemoglobin A1C: 6.4

## 2016-01-06 MED ORDER — LANSOPRAZOLE 30 MG PO CPDR: 30.0000 mg | DELAYED_RELEASE_CAPSULE | Freq: Every day | ORAL | Status: DC

## 2016-01-06 MED ORDER — LISINOPRIL 10 MG PO TABS: 10.0000 mg | ORAL_TABLET | Freq: Every day | ORAL | Status: DC

## 2016-01-06 MED ORDER — ATORVASTATIN CALCIUM 20 MG PO TABS: 20.0000 mg | ORAL_TABLET | Freq: Every day | ORAL | Status: DC

## 2016-01-06 MED ORDER — CYANOCOBALAMIN 1000 MCG/ML IJ SOLN
1000.0000 ug | Freq: Once | INTRAMUSCULAR | Status: AC
  Administered 2016-01-06: 1000 ug via INTRAMUSCULAR

## 2016-01-06 MED ORDER — CYANOCOBALAMIN 1000 MCG/ML IJ SOLN
1000.0000 ug | Freq: Once | INTRAMUSCULAR | Status: DC
Start: 2016-01-06 — End: 2016-01-06

## 2016-01-06 MED ORDER — CYANOCOBALAMIN 1000 MCG/ML IJ SOLN: 1000.0000 ug | Freq: Once | INTRAMUSCULAR | Status: DC

## 2016-01-06 NOTE — Patient Instructions (Addendum)
A1c is 6.4! That's great- improved from February visit at 6.8.   I stil want you to work on reversing your weight trend. I would love to see you below 6 even if possible or at least get your weight back down into 170s. As long as 6.5 or less, remain off medication  Let's follow up in 4 months since we are going to remain off diabetes medicine. We could do some targeted cholesterol labs if you were able to lose some weight  I ordered the following under my name Meds ordered this encounter  Medications  . lisinopril (PRINIVIL,ZESTRIL) 10 MG tablet    Sig: Take 1 tablet (10 mg total) by mouth daily.    Dispense:  90 tablet    Refill:  3  . lansoprazole (PREVACID) 30 MG capsule    Sig: Take 1 capsule (30 mg total) by mouth daily at 12 noon.    Dispense:  90 capsule    Refill:  3  . atorvastatin (LIPITOR) 20 MG tablet    Sig: Take 1 tablet (20 mg total) by mouth daily.    Dispense:  90 tablet    Refill:  3

## 2016-01-06 NOTE — Assessment & Plan Note (Signed)
S: controlled on Lisinopril 10mg , metoprolol 25mg  BID BP Readings from Last 3 Encounters:  01/06/16 118/66  12/21/15 130/82  11/16/15 120/72  A/P:Continue current meds, refilled lisinopril- metoprolol has been filled by cardiology but I am happy to fill in future if needed

## 2016-01-06 NOTE — Progress Notes (Signed)
Pre visit review using our clinic review tool, if applicable. No additional management support is needed unless otherwise documented below in the visit note. 

## 2016-01-06 NOTE — Assessment & Plan Note (Signed)
S: Reasonably controlled on atorvastatin 20 mg. No myalgias.  Lab Results  Component Value Date   CHOL 158 08/25/2015   HDL 27* 08/25/2015   LDLCALC 88 08/25/2015   TRIG 214* 08/25/2015   CHOLHDL 5.9* 08/25/2015   A/P: Patient's triglycerides have jumped up with weight gain. I have advised patient to work on weight loss. We could consider repeating an LDL and triglycerides at follow-up in 4 months.

## 2016-01-06 NOTE — Progress Notes (Signed)
Phone: [phone]  Subjective:  Patient presents today to establish care. Chief complaint-noted.   See problem oriented charting  The following were reviewed and entered/updated in epic: Past Medical History  Diagnosis Date  . Asthma   . GERD (gastroesophageal reflux disease)   . Diabetes mellitus without complication ([place])   . Hypertension   . Hyperlipidemia   . Sinus tachycardia ([place]) 06/29/2015   Patient Active Problem List   Diagnosis Date Noted  . Diabetes mellitus without complication ([place]) XX123456    Priority: High  . Mild persistent asthma 08/03/2015    Priority: Medium  . Sinus tachycardia ([place]) 06/29/2015    Priority: Medium  . Pernicious anemia 03/29/2015    Priority: Medium  . B12 deficiency 11/12/2014    Priority: Medium  . Hyperlipidemia 11/10/2014    Priority: Medium  . Essential hypertension 11/10/2014    Priority: Medium  . GERD (gastroesophageal reflux disease) 06/20/2011    Priority: Medium  . Allergic rhinitis 11/16/2015    Priority: Low  . Migraine 08/20/2011    Priority: Low   Past Surgical History  Procedure Laterality Date  . Leg surgery      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    Family History  Problem Relation Age of Onset  . Diabetes Mother   . Kidney disease Mother   . Diabetes Father   . Prostate cancer Father   . Hyperlipidemia Father     mother  . Hypertension Father     mother    Medications- reviewed and updated Current Outpatient Prescriptions  Medication Sig Dispense Refill  . albuterol (PROAIR HFA) 108 (90 Base) MCG/ACT inhaler Inhale 2 puffs into the lungs every 4 (four) hours as needed for wheezing or shortness of breath. 1 Inhaler 1  . aspirin EC 81 MG tablet Take by mouth.    Marland Kitchen atorvastatin (LIPITOR) 20 MG tablet Take 1 tablet (20 mg total) by mouth daily. 90 tablet 3  . beclomethasone (QVAR) 40 MCG/ACT inhaler Inhale 1 puff into the lungs 2 (two) times daily. 1 Inhaler 5  . lansoprazole (PREVACID) 30 MG capsule Take by  mouth.    Marland Kitchen lisinopril (PRINIVIL,ZESTRIL) 10 MG tablet     . metoprolol tartrate (LOPRESSOR) 25 MG tablet Take 1 tablet (25 mg total) by mouth 2 (two) times daily. 180 tablet 3   Current Facility-Administered Medications  Medication Dose Route Frequency Provider Last Rate Last Dose  . cyanocobalamin ((VITAMIN B-12)) injection 1,000 mcg  1,000 mcg Intramuscular Once Jerene Bears, MD        Allergies-reviewed and updated No Known Allergies  Social History   Social History  . Marital Status: Married    Spouse Name: N/A  . Number of Children: 2  . 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 Smoking status: Never Smoker   . Smokeless tobacco: Never Used  . Alcohol Use: 1.8 - 2.4 oz/week    0 Standard drinks or equivalent, 3-4 Cans of beer per week     Comment: social  . Drug Use: No  . Sexual Activity: Yes    Birth Control/ Protection: None   Other Topics Concern  . None   Social History Narrative   Married 20 years in 2017. 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    Colen Darling a[employer], graduate [employer]-  Hobbies: family time,[place] golfing, basketball with son, walking at park       ROS--Full ROS was completed Review of Systems  Constitutional: Negative for fever and chills.  HENT: Negative for hearing loss.   Eyes: Negative for blurred vision and double vision.  Respiratory: Negative for cough and hemoptysis.   Cardiovascular: Negative for chest pain and palpitations.  Gastrointestinal: Negative for nausea and vomiting.  Genitourinary: Negative for dysuria and urgency.  Musculoskeletal: Negative for myalgias and neck pain.  Skin: Negative for itching and rash.  Neurological: Negative for dizziness, tingling and headaches.  Endo/Heme/Allergies: Negative for polydipsia. Does not bruise/bleed easily.  Psychiatric/Behavioral:  Negative for hallucinations and substance abuse.   Objective: BP 118/66 mmHg  Pulse 81  Temp(Src) 98.5 F (36.9 C) (Oral)  Ht 5' 7.75" (1.721 m)  Wt 192 lb (87.091 kg)  BMI 29.40 kg/m2  SpO2 97% Gen: NAD, resting comfortably HEENT: Mucous membranes are moist. Oropharynx normal. TM normal. Eyes: sclera and lids normal, PERRLA Neck: no thyromegaly, no cervical lymphadenopathy CV: RRR no murmurs rubs or gallops Lungs: CTAB no crackles, wheeze, rhonchi Abdomen: soft/nontender/nondistended/normal bowel sounds. No rebound or guarding. Overweight Ext: no edema Skin: warm, dry, no rash Neuro: 5/5 strength in upper and lower extremities, normal gait, normal reflexes  Assessment/Plan:  Diabetes mellitus without complication (H[facility]) S: has been on metformin in past but later stopped after weight loss. Has gained back over 20 lbs but fortunately... Lab Results  Component Value Date   HGBA1C 6.4 01/06/2016  A/P: with a1c <6.5 we opted to workOn diet and exercise. Advised patient to stop drinking daily soda. Advised regular exercise. Plan is to follow-up in 4 months with another A1c after hopeful weight loss.   Hyperlipidemia S: Reasonably controlled on atorvastatin 20 mg. No myalgias.  Lab Results  Component Value Date   CHOL 158 08/25/2015   HDL 27* 08/25/2015   LDLCALC 88 08/25/2015   TRIG 214* 08/25/2015   CHOLHDL 5.9* 08/25/2015   A/P: Patient's triglycerides have jumped up with weight gain. I have advised patient to work on weight loss. We could consider repeating an LDL and triglycerides at follow-up in 4 months.    Essential hypertension S: controlled on Lisinopril 10mg , metoprolol 25mg  BID BP Readings from Last 3 Encounters:  01/06/16 118/66  12/21/15 130/82  11/16/15 120/72  A/P:Continue current meds, refilled lisinopril- metoprolol has been filled by cardiology but I am happy to fill in future if needed     Return in about 4 months (around 05/07/2016).   Orders  Placed This Encounter  Procedures  . POCT glycosylated hemoglobin (Hb A1C)    Meds ordered this encounter  Medications  . lisinopril (PRINIVIL,ZESTRIL) 10 MG tablet    Sig: Take 1 tablet (10 mg total) by mouth daily.    Dispense:  90 tablet    Refill:  3  . lansoprazole (PREVACID) 30 MG capsule    Sig: Take 1 capsule (30 mg total) by mouth daily at 12 noon.    Dispense:  90 capsule    Refill:  3  . atorvastatin (LIPITOR) 20 MG tablet    Sig: Take 1 tablet (20 mg total) by mouth daily.    Dispense:  90 tablet    Refill:  3    Return precautions advised.  Garret Reddish, MD

## 2016-01-06 NOTE — Assessment & Plan Note (Signed)
S: has been on metformin in past but later stopped after weight loss. Has gained back over 20 lbs but fortunately... Lab Results  Component Value Date   HGBA1C 6.4 01/06/2016  A/P: with a1c <6.5 we opted to workOn diet and exercise. Advised patient to stop drinking daily soda. Advised regular exercise. Plan is to follow-up in 4 months with another A1c after hopeful weight loss.

## 2016-01-10 ENCOUNTER — Other Ambulatory Visit: Payer: Self-pay | Admitting: Internal Medicine

## 2016-01-10 DIAGNOSIS — E538 Deficiency of other specified B group vitamins: Secondary | ICD-10-CM

## 2016-01-26 ENCOUNTER — Other Ambulatory Visit: Payer: Self-pay

## 2016-01-26 ENCOUNTER — Other Ambulatory Visit (INDEPENDENT_AMBULATORY_CARE_PROVIDER_SITE_OTHER): Payer: BC Managed Care – PPO

## 2016-01-26 DIAGNOSIS — E538 Deficiency of other specified B group vitamins: Secondary | ICD-10-CM

## 2016-01-26 LAB — VITAMIN B12: VITAMIN B 12: 232 pg/mL (ref 211–911)

## 2016-01-27 ENCOUNTER — Telehealth: Payer: Self-pay | Admitting: Internal Medicine

## 2016-01-27 ENCOUNTER — Ambulatory Visit (INDEPENDENT_AMBULATORY_CARE_PROVIDER_SITE_OTHER): Payer: BC Managed Care – PPO | Admitting: Internal Medicine

## 2016-01-27 DIAGNOSIS — E538 Deficiency of other specified B group vitamins: Secondary | ICD-10-CM | POA: Diagnosis not present

## 2016-01-27 MED ORDER — CYANOCOBALAMIN 1000 MCG/ML IJ SOLN
1000.0000 ug | Freq: Once | INTRAMUSCULAR | Status: AC
  Administered 2016-01-27: 1000 ug via INTRAMUSCULAR

## 2016-01-27 MED ORDER — CYANOCOBALAMIN 1000 MCG/ML IJ SOLN
1000.0000 ug | Freq: Once | INTRAMUSCULAR | Status: AC
  Administered 2016-02-10: 1000 ug via INTRAMUSCULAR

## 2016-01-27 NOTE — Telephone Encounter (Signed)
Pt states he has been having stomach discomfort and is due for an egd in September. Has OV with Pyrtle in October but requests to be seen sooner. Pt scheduled to see Alen Blew PA 02/01/16@8 :30am. Pt aware of appt.

## 2016-02-01 ENCOUNTER — Ambulatory Visit: Payer: BC Managed Care – PPO | Admitting: Physician Assistant

## 2016-02-04 ENCOUNTER — Encounter: Payer: Self-pay | Admitting: Internal Medicine

## 2016-02-08 ENCOUNTER — Other Ambulatory Visit: Payer: Self-pay | Admitting: Internal Medicine

## 2016-02-09 ENCOUNTER — Telehealth: Payer: Self-pay | Admitting: Internal Medicine

## 2016-02-09 NOTE — Telephone Encounter (Signed)
I have spoken to [place] at [facility] who states that she did not get the b12 script although our system indicates that the pharmacy confirmed receipt at 9:30 am 02/08/16. I have given a verbal order for b12.

## 2016-02-10 ENCOUNTER — Ambulatory Visit (INDEPENDENT_AMBULATORY_CARE_PROVIDER_SITE_OTHER): Payer: BC Managed Care – PPO | Admitting: Internal Medicine

## 2016-02-10 ENCOUNTER — Encounter (INDEPENDENT_AMBULATORY_CARE_PROVIDER_SITE_OTHER): Payer: Self-pay

## 2016-02-10 DIAGNOSIS — E538 Deficiency of other specified B group vitamins: Secondary | ICD-10-CM | POA: Diagnosis not present

## 2016-02-15 ENCOUNTER — Other Ambulatory Visit: Payer: Self-pay | Admitting: Allergy and Immunology

## 2016-02-15 DIAGNOSIS — J4530 Mild persistent asthma, uncomplicated: Secondary | ICD-10-CM

## 2016-02-24 ENCOUNTER — Ambulatory Visit (INDEPENDENT_AMBULATORY_CARE_PROVIDER_SITE_OTHER): Payer: BC Managed Care – PPO | Admitting: Internal Medicine

## 2016-02-24 DIAGNOSIS — E538 Deficiency of other specified B group vitamins: Secondary | ICD-10-CM | POA: Diagnosis not present

## 2016-02-24 MED ORDER — CYANOCOBALAMIN 1000 MCG/ML IJ SOLN
1000.0000 ug | INTRAMUSCULAR | Status: DC
  Administered 2016-02-24 – 2016-03-23 (×3): 1000 ug via INTRAMUSCULAR

## 2016-03-01 ENCOUNTER — Encounter: Payer: Self-pay | Admitting: Internal Medicine

## 2016-03-01 ENCOUNTER — Ambulatory Visit (AMBULATORY_SURGERY_CENTER): Payer: BC Managed Care – PPO | Admitting: *Deleted

## 2016-03-01 VITALS — Ht 67.0 in | Wt 197.0 lb

## 2016-03-01 DIAGNOSIS — K2940 Chronic atrophic gastritis without bleeding: Secondary | ICD-10-CM

## 2016-03-01 NOTE — Progress Notes (Signed)
No egg or soy allergy known to patient  No issues with past sedation with any surgeries  or procedures, no intubation problems  No diet pills per patient No home 02 use per patient  No blood thinners per patient  Pt denies issues with constipation  No A fib or A flutter   

## 2016-03-02 ENCOUNTER — Encounter: Payer: Self-pay | Admitting: Podiatry

## 2016-03-02 ENCOUNTER — Ambulatory Visit (INDEPENDENT_AMBULATORY_CARE_PROVIDER_SITE_OTHER): Payer: BC Managed Care – PPO | Admitting: Podiatry

## 2016-03-02 ENCOUNTER — Ambulatory Visit (INDEPENDENT_AMBULATORY_CARE_PROVIDER_SITE_OTHER): Payer: BC Managed Care – PPO

## 2016-03-02 VITALS — BP 128/78 | HR 70 | Resp 12

## 2016-03-02 DIAGNOSIS — M79672 Pain in left foot: Secondary | ICD-10-CM | POA: Diagnosis not present

## 2016-03-02 DIAGNOSIS — M79671 Pain in right foot: Secondary | ICD-10-CM | POA: Diagnosis not present

## 2016-03-02 DIAGNOSIS — B353 Tinea pedis: Secondary | ICD-10-CM | POA: Diagnosis not present

## 2016-03-02 MED ORDER — NAFTIFINE HCL 1 % EX CREA: TOPICAL_CREAM | CUTANEOUS | 2 refills | Status: DC

## 2016-03-02 NOTE — Progress Notes (Signed)
   Subjective:    Patient ID: Gregory Collins, male    DOB: 12/20/1970, 45 y.o.   MRN: [id number]  HPI: He presents today with a chief complaint of discoloration to his feet and to his toes he states that his feet sometimes itch and this is going on for about a year now he's tried no treatment.    Review of Systems  Skin: Positive for color change.       Objective:   Physical Exam: Vital signs are stable he is alert and oriented 3 no erythema edema saline as drainage or odor. Pulses are strongly palpable. He has interdigital tinea pedis bilateral he has a nevus to the plantar lateral aspect of the fourth digit which appears to be benign. He also has a nevus to the plantar aspect of the right foot again benign. I did express to him that he needs to watch these very carefully make sure there are no changes in the future.          Assessment & Plan:  Assessment interdigital tinea pedis bilateral.  Plan: Started him on Naftin cream to be applied twice daily follow-up with me in about 6 weeks if necessary.

## 2016-03-09 ENCOUNTER — Ambulatory Visit (INDEPENDENT_AMBULATORY_CARE_PROVIDER_SITE_OTHER): Payer: BC Managed Care – PPO | Admitting: Internal Medicine

## 2016-03-09 DIAGNOSIS — E538 Deficiency of other specified B group vitamins: Secondary | ICD-10-CM | POA: Diagnosis not present

## 2016-03-14 ENCOUNTER — Telehealth: Payer: Self-pay | Admitting: Internal Medicine

## 2016-03-14 ENCOUNTER — Encounter: Payer: BC Managed Care – PPO | Admitting: Internal Medicine

## 2016-03-14 NOTE — Telephone Encounter (Signed)
No charge. 

## 2016-03-15 ENCOUNTER — Encounter: Payer: Self-pay | Admitting: Internal Medicine

## 2016-03-23 ENCOUNTER — Ambulatory Visit (INDEPENDENT_AMBULATORY_CARE_PROVIDER_SITE_OTHER): Payer: BC Managed Care – PPO | Admitting: Internal Medicine

## 2016-03-23 DIAGNOSIS — E538 Deficiency of other specified B group vitamins: Secondary | ICD-10-CM

## 2016-03-23 MED ORDER — CYANOCOBALAMIN 1000 MCG/ML IJ SOLN: 1000.0000 ug | Freq: Once | INTRAMUSCULAR | 0 refills | Status: DC

## 2016-03-23 NOTE — Patient Instructions (Signed)
b12 given

## 2016-04-05 ENCOUNTER — Ambulatory Visit (INDEPENDENT_AMBULATORY_CARE_PROVIDER_SITE_OTHER): Payer: BC Managed Care – PPO | Admitting: Internal Medicine

## 2016-04-05 DIAGNOSIS — E538 Deficiency of other specified B group vitamins: Secondary | ICD-10-CM | POA: Diagnosis not present

## 2016-04-05 MED ORDER — CYANOCOBALAMIN 1000 MCG/ML IJ SOLN
1000.0000 ug | Freq: Once | INTRAMUSCULAR | Status: AC
  Administered 2016-04-05: 1000 ug via INTRAMUSCULAR

## 2016-04-10 ENCOUNTER — Ambulatory Visit: Payer: BC Managed Care – PPO | Admitting: Internal Medicine

## 2016-04-12 ENCOUNTER — Ambulatory Visit (INDEPENDENT_AMBULATORY_CARE_PROVIDER_SITE_OTHER): Payer: BC Managed Care – PPO | Admitting: Family Medicine

## 2016-04-12 ENCOUNTER — Encounter: Payer: Self-pay | Admitting: Family Medicine

## 2016-04-12 VITALS — BP 104/72 | HR 88 | Temp 98.0°F | Wt 189.6 lb

## 2016-04-12 DIAGNOSIS — E785 Hyperlipidemia, unspecified: Secondary | ICD-10-CM

## 2016-04-12 DIAGNOSIS — D510 Vitamin B12 deficiency anemia due to intrinsic factor deficiency: Secondary | ICD-10-CM

## 2016-04-12 DIAGNOSIS — I10 Essential (primary) hypertension: Secondary | ICD-10-CM | POA: Diagnosis not present

## 2016-04-12 DIAGNOSIS — E119 Type 2 diabetes mellitus without complications: Secondary | ICD-10-CM

## 2016-04-12 LAB — CBC WITH DIFFERENTIAL/PLATELET
Basophils Absolute: 0 10*3/uL (ref 0.0–0.1)
Basophils Relative: 0.5 % (ref 0.0–3.0)
EOS PCT: 9.8 % — AB (ref 0.0–5.0)
Eosinophils Absolute: 0.5 10*3/uL (ref 0.0–0.7)
HEMATOCRIT: 42.5 % (ref 39.0–52.0)
HEMOGLOBIN: 14.3 g/dL (ref 13.0–17.0)
Lymphocytes Relative: 31.6 % (ref 12.0–46.0)
Lymphs Abs: 1.7 10*3/uL (ref 0.7–4.0)
MCHC: 33.6 g/dL (ref 30.0–36.0)
MCV: 84.8 fl (ref 78.0–100.0)
MONOS PCT: 6.3 % (ref 3.0–12.0)
Monocytes Absolute: 0.3 10*3/uL (ref 0.1–1.0)
NEUTROS ABS: 2.8 10*3/uL (ref 1.4–7.7)
Neutrophils Relative %: 51.8 % (ref 43.0–77.0)
Platelets: 211 10*3/uL (ref 150.0–400.0)
RBC: 5.01 Mil/uL (ref 4.22–5.81)
RDW: 14.2 % (ref 11.5–15.5)
WBC: 5.5 10*3/uL (ref 4.0–10.5)

## 2016-04-12 LAB — COMPREHENSIVE METABOLIC PANEL
ALBUMIN: 4.2 g/dL (ref 3.5–5.2)
ALT: 23 U/L (ref 0–53)
AST: 18 U/L (ref 0–37)
Alkaline Phosphatase: 60 U/L (ref 39–117)
BUN: 14 mg/dL (ref 6–23)
CALCIUM: 8.9 mg/dL (ref 8.4–10.5)
CHLORIDE: 102 meq/L (ref 96–112)
CO2: 28 mEq/L (ref 19–32)
Creatinine, Ser: 1.06 mg/dL (ref 0.40–1.50)
GFR: 97.69 mL/min (ref >60.00)
Glucose, Bld: 106 mg/dL — ABNORMAL HIGH (ref 70–99)
POTASSIUM: 4.2 meq/L (ref 3.5–5.1)
Sodium: 139 mEq/L (ref 135–145)
Total Bilirubin: 0.4 mg/dL (ref 0.2–1.2)
Total Protein: 7.4 g/dL (ref 6.0–8.3)

## 2016-04-12 LAB — VITAMIN B12: VITAMIN B 12: 522 pg/mL (ref 211–911)

## 2016-04-12 LAB — HEMOGLOBIN A1C: Hgb A1c MFr Bld: 6.7 % — ABNORMAL HIGH (ref 4.6–6.5)

## 2016-04-12 LAB — TRIGLYCERIDES: Triglycerides: 128 mg/dL (ref 0.0–149.0)

## 2016-04-12 LAB — LDL CHOLESTEROL, DIRECT: LDL DIRECT: 104 mg/dL

## 2016-04-12 MED ORDER — LISINOPRIL 5 MG PO TABS: 5.0000 mg | ORAL_TABLET | Freq: Every day | ORAL | 3 refills | Status: DC

## 2016-04-12 NOTE — Patient Instructions (Addendum)
Fantastic job with weight loss!  Wt Readings from Last 3 Encounters:  04/12/16 189 lb 9.6 oz (86 kg)  03/01/16 197 lb (89.4 kg)  01/06/16 192 lb (87.1 kg)  BMI down to 29.7 and under 30 see most of health benefits- but continue in positive direction and how about we check in 4-6 months with goal of at least another 5 lbs off.   If worsening abdominal pain please let me know. Consider ultrasound of liver/gallbladder  Labs before you leave  Reduce lisinopril to 5mg - can wait until 10mg  runs out. Stay on this as long as BP under 130.   Health Maintenance Due  Topic Date Due  . OPHTHALMOLOGY EXAM  06/20/1982  . HIV Screening  06/21/1987  . INFLUENZA VACCINE  02/08/2016  . FOOT EXAM  02/24/2016

## 2016-04-12 NOTE — Progress Notes (Signed)
Subjective:  Gregory Collins is a 45 y.o. year old very pleasant male patient who presents for/with See problem oriented charting ROS- No chest pain or shortness of breath. No headache or blurry vision.  No hypoglycemia. Abdominal pain as noted, weight loss is intentional.see any ROS included in HPI as well.   Past Medical History-  Patient Active Problem List   Diagnosis Date Noted  . Diabetes mellitus without complication ([place]) XX123456    Priority: High  . Mild persistent asthma 08/03/2015    Priority: Medium  . Sinus tachycardia 06/29/2015    Priority: Medium  . Pernicious anemia 03/29/2015    Priority: Medium  . Hyperlipidemia 11/10/2014    Priority: Medium  . Essential hypertension 11/10/2014    Priority: Medium  . GERD (gastroesophageal reflux disease) 06/20/2011    Priority: Medium  . Allergic rhinitis 11/16/2015    Priority: Low  . Migraine 08/20/2011    Priority: Low    Medications- reviewed and updated Current Outpatient Prescriptions  Medication Sig Dispense Refill  . aspirin EC 81 MG tablet Take by mouth.    Marland Kitchen atorvastatin (LIPITOR) 20 MG tablet Take 1 tablet (20 mg total) by mouth daily. 90 tablet 3  . beclomethasone (QVAR) 40 MCG/ACT inhaler Inhale 1 puff into the lungs 2 (two) times daily. 1 Inhaler 5  . cyanocobalamin (,VITAMIN B-12,) 1000 MCG/ML injection Inject 1 mL (1,000 mcg total) into the muscle every 14 (fourteen) days. Pharmacy-please provide 3 cc syringes and 25 gauge 1 inch needles 14 mL 0  . lansoprazole (PREVACID) 30 MG capsule Take 1 capsule (30 mg total) by mouth daily at 12 noon. (Patient taking differently: Take 30 mg by mouth 2 (two) times daily before a meal. ) 90 capsule 3  . lisinopril (PRINIVIL,ZESTRIL) 5 MG tablet Take 1 tablet (5 mg total) by mouth daily. 91 tablet 3  . metoprolol tartrate (LOPRESSOR) 25 MG tablet Take 1 tablet (25 mg total) by mouth 2 (two) times daily. 180 tablet 3  . naftifine (NAFTIN) 1 % cream Apply to the  affected area twice daily. 90 g 2  . PROAIR HFA 108 (90 Base) MCG/ACT inhaler INHALE 2 PUFFS INTO THE LUNGS EVERY 4 (FOUR) HOURS AS NEEDED FOR WHEEZING OR SHORTNESS OF BREATH. 8.5 Inhaler 1   Current Facility-Administered Medications  Medication Dose Route Frequency Provider Last Rate Last Dose  . cyanocobalamin ((VITAMIN B-12)) injection 1,000 mcg  1,000 mcg Intramuscular Q14 Days Jerene Bears, MD   1,000 mcg at 03/23/16 0905    Objective: BP 104/72 (BP Location: Left Arm, Patient Position: Sitting, Cuff Size: Large)   Pulse 88   Temp 98 F (36.7 C) (Oral)   Wt 189 lb 9.6 oz (86 kg)   SpO2 98%   BMI 29.70 kg/m  Gen: NAD, resting comfortably CV: RRR no murmurs rubs or gallops Lungs: CTAB no crackles, wheeze, rhonchi Abdomen: soft/nontender/nondistended/normal bowel sounds. No rebound or guarding. No hepatosplenomegaly Ext: no edema Skin: warm, dry, no rash over abdomen  Diabetic Foot Exam - Simple   Simple Foot Form Diabetic Foot exam was performed with the following findings:  Yes 04/12/2016 11:07 AM  Visual Inspection No deformities, no ulcerations, no other skin breakdown bilaterally:  Yes Sensation Testing Intact to touch and monofilament testing bilaterally:  Yes Pulse Check Posterior Tibialis and Dorsalis pulse intact bilaterally:  Yes Comments     Assessment/Plan:  Diabetes mellitus without complication ([place]) S: last visit well controlled. On no medication. Metformin in past. He  was to cut daily soda out and work on regular exercise- down 8 lbs!!!! Lab Results  Component Value Date   HGBA1C 6.4 01/06/2016   HGBA1C 6.8 08/25/2015   HGBA1C 5.9 02/24/2015   A/P: update a1c today, hopeful even lower than last visit   Pernicious anemia S: history of low b12 due to pernicious anemia. Even on every 3 week shots levels were low- increased to every 2 weeks A/P: update b12 today   Essential hypertension S: controlled very well. Home #s 110 over 64 usually. On  lisinopril 10mg  and metoprolol 25mg  BID BP Readings from Last 3 Encounters:  04/12/16 104/72  03/02/16 128/78  01/06/16 118/66  A/P:Continue current meds:  But decrease lisinopril to 5mg   Hyperlipidemia S: down 8 lbs. Cut sodas. More exercise A/P: update LDL and triglycerides today- continue lifestyle changes  Intermittent left sided abdominal pain for a few minutes resolves- mainly RUQ. 3-4x a week. More regular with yogurt but has had constipation with firm stools. Mild to moderate. Update labs- consider [place] in future if worsens  4-6 months. Advised CPE after 08/24/16 To sign ROI for eye appt Declines flu  Orders Placed This Encounter  Procedures  . Vitamin B12  . Hemoglobin A1c    [place]  . Triglycerides  . LDL cholesterol, direct    [place]  . CBC with Differential/Platelet  . Comprehensive metabolic panel    [place]    Meds ordered this encounter  Medications  . lisinopril (PRINIVIL,ZESTRIL) 5 MG tablet    Sig: Take 1 tablet (5 mg total) by mouth daily.    Dispense:  91 tablet    Refill:  3    Return precautions advised.  Garret Reddish, MD

## 2016-04-12 NOTE — Progress Notes (Signed)
Pre visit review using our clinic review tool, if applicable. No additional management support is needed unless otherwise documented below in the visit note. 

## 2016-04-12 NOTE — Assessment & Plan Note (Signed)
S: controlled very well. Home #s 110 over 64 usually. On lisinopril 10mg  and metoprolol 25mg  BID BP Readings from Last 3 Encounters:  04/12/16 104/72  03/02/16 128/78  01/06/16 118/66  A/P:Continue current meds:  But decrease lisinopril to 5mg 

## 2016-04-12 NOTE — Assessment & Plan Note (Signed)
S: history of low b12 due to pernicious anemia. Even on every 3 week shots levels were low- increased to every 2 weeks A/P: update b12 today

## 2016-04-12 NOTE — Assessment & Plan Note (Signed)
S: down 8 lbs. Cut sodas. More exercise A/P: update LDL and triglycerides today- continue lifestyle changes

## 2016-04-12 NOTE — Assessment & Plan Note (Signed)
S: last visit well controlled. On no medication. Metformin in past. He was to cut daily soda out and work on regular exercise- down 8 lbs!!!! Lab Results  Component Value Date   HGBA1C 6.4 01/06/2016   HGBA1C 6.8 08/25/2015   HGBA1C 5.9 02/24/2015   A/P: update a1c today, hopeful even lower than last visit

## 2016-04-18 ENCOUNTER — Other Ambulatory Visit: Payer: Self-pay | Admitting: Internal Medicine

## 2016-04-24 ENCOUNTER — Other Ambulatory Visit: Payer: Self-pay

## 2016-04-24 DIAGNOSIS — E538 Deficiency of other specified B group vitamins: Secondary | ICD-10-CM

## 2016-04-24 MED ORDER — CYANOCOBALAMIN 1000 MCG/ML IJ SOLN
1000.0000 ug | Freq: Once | INTRAMUSCULAR | Status: DC
Start: 2016-04-24 — End: 2016-05-02

## 2016-04-26 ENCOUNTER — Telehealth: Payer: Self-pay | Admitting: Family Medicine

## 2016-04-26 NOTE — Telephone Encounter (Signed)
Pt is still having stomach pains and would like to proceed with ultrasound. Please advise

## 2016-04-26 NOTE — Telephone Encounter (Signed)
Yes thanks, may order RUQ ultrasound under RUQ pain

## 2016-04-27 ENCOUNTER — Other Ambulatory Visit: Payer: Self-pay

## 2016-04-27 DIAGNOSIS — R1011 Right upper quadrant pain: Secondary | ICD-10-CM

## 2016-04-27 NOTE — Telephone Encounter (Signed)
Order for ultrasound entered. Called patient to make him aware to be on the lookout for their phone call to schedule [place].

## 2016-05-01 ENCOUNTER — Other Ambulatory Visit: Payer: Self-pay | Admitting: Internal Medicine

## 2016-05-01 ENCOUNTER — Encounter: Payer: Self-pay | Admitting: Internal Medicine

## 2016-05-01 ENCOUNTER — Telehealth: Payer: Self-pay | Admitting: Internal Medicine

## 2016-05-01 NOTE — Telephone Encounter (Signed)
I have left voicemail for patient that we are glad to refill b12, but he does need to reschedule endoscopy first.

## 2016-05-02 ENCOUNTER — Telehealth: Payer: Self-pay | Admitting: Internal Medicine

## 2016-05-02 MED ORDER — CYANOCOBALAMIN 1000 MCG/ML IJ SOLN: 1000.0000 ug | INTRAMUSCULAR | 0 refills | Status: DC

## 2016-05-02 NOTE — Telephone Encounter (Signed)
I have sent b12 to patient's pharmacy. B12 injection scheduled.

## 2016-05-10 ENCOUNTER — Ambulatory Visit
Admission: RE | Admit: 2016-05-10 | Discharge: 2016-05-10 | Disposition: A | Discharge status: Home / Self Care | Payer: BC Managed Care – PPO | Source: Ambulatory Visit | Attending: Family Medicine | Admitting: Family Medicine

## 2016-05-10 DIAGNOSIS — R1011 Right upper quadrant pain: Secondary | ICD-10-CM

## 2016-05-30 ENCOUNTER — Ambulatory Visit: Payer: BC Managed Care – PPO | Admitting: *Deleted

## 2016-05-30 VITALS — Ht 67.0 in | Wt 194.2 lb

## 2016-05-30 DIAGNOSIS — K219 Gastro-esophageal reflux disease without esophagitis: Secondary | ICD-10-CM

## 2016-05-30 DIAGNOSIS — E538 Deficiency of other specified B group vitamins: Secondary | ICD-10-CM

## 2016-05-30 NOTE — Progress Notes (Signed)
Denies allergies to eggs or soy products. Denies complications with sedation or anesthesia. Denies O2 use. Denies use of diet or weight loss medications.  Emmi instructions given for endoscopy.  

## 2016-06-08 ENCOUNTER — Encounter: Payer: Self-pay | Admitting: Internal Medicine

## 2016-06-09 ENCOUNTER — Encounter: Payer: Self-pay | Admitting: Internal Medicine

## 2016-06-11 ENCOUNTER — Other Ambulatory Visit: Payer: Self-pay | Admitting: Allergy and Immunology

## 2016-06-11 DIAGNOSIS — J4530 Mild persistent asthma, uncomplicated: Secondary | ICD-10-CM

## 2016-06-12 ENCOUNTER — Other Ambulatory Visit: Payer: Self-pay | Admitting: Internal Medicine

## 2016-06-12 ENCOUNTER — Encounter: Payer: Self-pay | Admitting: Allergy and Immunology

## 2016-06-12 DIAGNOSIS — E538 Deficiency of other specified B group vitamins: Secondary | ICD-10-CM

## 2016-06-13 ENCOUNTER — Other Ambulatory Visit: Payer: Self-pay | Admitting: *Deleted

## 2016-06-13 DIAGNOSIS — J4530 Mild persistent asthma, uncomplicated: Secondary | ICD-10-CM

## 2016-06-13 MED ORDER — BECLOMETHASONE DIPROPIONATE 40 MCG/ACT IN AERS: 1.0000 | INHALATION_SPRAY | Freq: Two times a day (BID) | RESPIRATORY_TRACT | 0 refills | Status: DC

## 2016-06-13 MED ORDER — ALBUTEROL SULFATE HFA 108 (90 BASE) MCG/ACT IN AERS: 2.0000 | INHALATION_SPRAY | RESPIRATORY_TRACT | 0 refills | Status: DC | PRN

## 2016-06-13 NOTE — Telephone Encounter (Signed)
Per Dr Hilarie Fredrickson, patient should have b12 labs again to determine whether he needs to continue at once monthly injections or once every 2 weeks injection. It has been 2 months since his last b12 level. Patient has been advised. He will have labs tomorrow and we will decide at that time about appropriate supplementation.

## 2016-06-14 ENCOUNTER — Ambulatory Visit (AMBULATORY_SURGERY_CENTER): Payer: BC Managed Care – PPO | Admitting: Internal Medicine

## 2016-06-14 ENCOUNTER — Encounter: Payer: Self-pay | Admitting: Internal Medicine

## 2016-06-14 VITALS — BP 103/66 | HR 87 | Temp 98.6°F | Resp 24 | Ht 67.0 in | Wt 194.0 lb

## 2016-06-14 DIAGNOSIS — K2940 Chronic atrophic gastritis without bleeding: Secondary | ICD-10-CM

## 2016-06-14 DIAGNOSIS — Z8719 Personal history of other diseases of the digestive system: Secondary | ICD-10-CM

## 2016-06-14 MED ORDER — SODIUM CHLORIDE 0.9 % IV SOLN: 500.0000 mL | INTRAVENOUS | Status: DC

## 2016-06-14 NOTE — Patient Instructions (Signed)
YOU HAD AN ENDOSCOPIC PROCEDURE TODAY AT THE [facility]:   Refer to the procedure report that was given to you for any specific questions about what was found during the examination.  If the procedure report does not answer your questions, please call your gastroenterologist to clarify.  If you requested that your care partner not be given the details of your procedure findings, then the procedure report has been included in a sealed envelope for you to review at your convenience later.  YOU SHOULD EXPECT: Some feelings of bloating in the abdomen. Passage of more gas than usual.  Walking can help get rid of the air that was put into your GI tract during the procedure and reduce the bloating. If you had a lower endoscopy (such as a colonoscopy or flexible sigmoidoscopy) you may notice spotting of blood in your stool or on the toilet paper. If you underwent a bowel prep for your procedure, you may not have a normal bowel movement for a few days.  Please Note:  You might notice some irritation and congestion in your nose or some drainage.  This is from the oxygen used during your procedure.  There is no need for concern and it should clear up in a day or so.  SYMPTOMS TO REPORT IMMEDIATELY:   Following lower endoscopy (colonoscopy or flexible sigmoidoscopy):  Excessive amounts of blood in the stool  Significant tenderness or worsening of abdominal pains  Swelling of the abdomen that is new, acute  Fever of 100F or higher   Following upper endoscopy (EGD)  Vomiting of blood or coffee ground material  New chest pain or pain under the shoulder blades  Painful or persistently difficult swallowing  New shortness of breath  Fever of 100F or higher  Black, tarry-looking stools  For urgent or emergent issues, a gastroenterologist can be reached at any hour by calling [phone].   DIET:  We do recommend a small meal at first, but then you may proceed to your regular diet.  Drink  plenty of fluids but you should avoid alcoholic beverages for 24 hours.  ACTIVITY:  You should plan to take it easy for the rest of today and you should NOT DRIVE or use heavy machinery until tomorrow (because of the sedation medicines used during the test).    FOLLOW UP: Our staff will call the number listed on your records the next business day following your procedure to check on you and address any questions or concerns that you may have regarding the information given to you following your procedure. If we do not reach you, we will leave a message.  However, if you are feeling well and you are not experiencing any problems, there is no need to return our call.  We will assume that you have returned to your regular daily activities without incident.  If any biopsies were taken you will be contacted by phone or by letter within the next 1-3 weeks.  Please call us at [phone] if you have not heard about the biopsies in 3 weeks.    SIGNATURES/CONFIDENTIALITY: You and/or your care partner have signed paperwork which will be entered into your electronic medical record.  These signatures attest to the fact that that the information above on your After Visit Summary has been reviewed and is understood.  Full responsibility of the confidentiality of this discharge information lies with you and/or your care-partner.  Hiatal hernia information given. 

## 2016-06-14 NOTE — Op Note (Signed)
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 The patient's tolerance of                            previous anesthesia was also reviewed. The risks                            and benefits of the procedure and the sedation                            options and risks were discussed with the patient.                            All questions were answered, and informed consent                            was obtained. Prior Anticoagulants: The patient has                            taken no previous anticoagulant or antiplatelet                            agents. ASA Grade Assessment: II - A patient with                            mild systemic disease. After reviewing the risks                            and benefits, the patient was deemed in  satisfactory condition to undergo the procedure.                           After obtaining informed consent, the endoscope was                            passed under direct vision. Throughout the                            procedure, the patient's blood pressure, pulse, and                            oxygen saturations were monitored continuously. The                            Model GIF-HQ190  [id number]) scope was introduced                            through the mouth, and advanced to the third part                            of duodenum. The upper GI endoscopy was                            accomplished without difficulty. The patient                            tolerated the procedure well. Scope In: Scope Out: Findings:                 The examined esophagus was normal. Z-line regular                            at 38 cm.                           A 2 cm hiatal hernia was present.                           Diffuse mildly atrophic-appearing mucosa was found                            at the incisura and in the gastric antrum.                           The cardia, gastric fundus and gastric body were                            normal. Multiple biopsies were obtained with cold                            forceps for histology in the cardia and gastric                            fundus (Jar 3), in the gastric  body (Jar 2), and at                            the incisura and gastric antrum (Jar 1).                           The cardia and gastric fundus were normal on                            retroflexion.                           The examined duodenum was normal. Complications:            No immediate complications. Estimated Blood Loss:     Estimated blood loss was minimal. Impression:               - Normal esophagus.                           - 2 cm hiatal hernia.                           - Gastric mucosal atrophy without polyps.                           - Normal cardia, gastric fundus and gastric body.                           - Normal examined duodenum.                           - Multiple biopsies were obtained in the cardia, in                            the gastric fundus, in the gastric body, at the                            incisura and in the gastric antrum (mapping in 3                            Jars given history of atropic gastritis with                             intestinal metaplasia). Recommendation:           - Patient has a contact number available for                            emergencies. The signs and symptoms of potential                            delayed complications were discussed with the                            patient. Return to normal activities tomorrow.  Written discharge instructions were provided to the                            patient.                           - Resume previous diet.                           - Continue present medications.                           - Await pathology results. Jerene Bears, MD 06/14/2016 10:22:02 AM This report has been signed electronically.

## 2016-06-14 NOTE — Progress Notes (Signed)
Called to room to assist during endoscopic procedure.  Patient ID and intended procedure confirmed with present staff. Received instructions for my participation in the procedure from the performing physician.  

## 2016-06-14 NOTE — Progress Notes (Signed)
Report to PACU, RN, vss, BBS= Clear.  

## 2016-06-15 ENCOUNTER — Telehealth: Payer: Self-pay

## 2016-06-15 NOTE — Telephone Encounter (Signed)
  Follow up Call-  Call back number 06/14/2016  Post procedure Call Back phone  # [phone]  Permission to leave phone message Yes  Some recent data might be hidden     Patient questions:  Do you have a fever, pain , or abdominal swelling? No. Pain Score  0 *  Have you tolerated food without any problems? Yes.    Have you been able to return to your normal activities? Yes.    Do you have any questions about your discharge instructions: Diet   No. Medications  No. Follow up visit  No.  Do you have questions or concerns about your Care? No.  Actions: * If pain score is 4 or above: No action needed, pain <4.

## 2016-06-19 ENCOUNTER — Other Ambulatory Visit: Payer: Self-pay | Admitting: Family Medicine

## 2016-06-20 ENCOUNTER — Other Ambulatory Visit: Payer: Self-pay

## 2016-06-20 ENCOUNTER — Other Ambulatory Visit (INDEPENDENT_AMBULATORY_CARE_PROVIDER_SITE_OTHER): Payer: BC Managed Care – PPO

## 2016-06-20 DIAGNOSIS — E538 Deficiency of other specified B group vitamins: Secondary | ICD-10-CM | POA: Diagnosis not present

## 2016-06-20 LAB — VITAMIN B12: VITAMIN B 12: 427 pg/mL (ref 211–911)

## 2016-06-20 MED ORDER — CYANOCOBALAMIN 1000 MCG/ML IJ SOLN: 1000.0000 ug | INTRAMUSCULAR | 0 refills | Status: DC

## 2016-06-22 ENCOUNTER — Encounter: Payer: Self-pay | Admitting: Internal Medicine

## 2016-06-27 ENCOUNTER — Encounter: Payer: Self-pay | Admitting: Internal Medicine

## 2016-06-27 ENCOUNTER — Telehealth: Payer: Self-pay | Admitting: Internal Medicine

## 2016-06-27 NOTE — Telephone Encounter (Signed)
Pathology letter reviewed with pt and questions answered.

## 2016-07-17 ENCOUNTER — Ambulatory Visit (INDEPENDENT_AMBULATORY_CARE_PROVIDER_SITE_OTHER): Payer: BC Managed Care – PPO | Admitting: Allergy and Immunology

## 2016-07-17 ENCOUNTER — Encounter: Payer: Self-pay | Admitting: Allergy and Immunology

## 2016-07-17 VITALS — BP 112/70 | HR 86 | Temp 97.8°F | Resp 14 | Ht 66.5 in | Wt 197.0 lb

## 2016-07-17 DIAGNOSIS — J4530 Mild persistent asthma, uncomplicated: Secondary | ICD-10-CM | POA: Diagnosis not present

## 2016-07-17 DIAGNOSIS — J3089 Other allergic rhinitis: Secondary | ICD-10-CM | POA: Diagnosis not present

## 2016-07-17 MED ORDER — BECLOMETHASONE DIPROPIONATE 40 MCG/ACT IN AERS: 1.0000 | INHALATION_SPRAY | Freq: Two times a day (BID) | RESPIRATORY_TRACT | 2 refills | Status: DC

## 2016-07-17 MED ORDER — ALBUTEROL SULFATE HFA 108 (90 BASE) MCG/ACT IN AERS: 2.0000 | INHALATION_SPRAY | RESPIRATORY_TRACT | 1 refills | Status: DC | PRN

## 2016-07-17 NOTE — Assessment & Plan Note (Signed)
Well-controlled.  Continue Qvar 40 g, one inhalation via spacer device twice a day, and albuterol HFA, 1-2 inhalations every 4-6 hours as needed and 15 minutes prior to exercise.  During respiratory tract infections or asthma flares, add Qvar 40g 3 inhalations 2 times per day until symptoms have returned to baseline.  Subjective and objective measures of pulmonary function will be followed and the treatment plan will be adjusted accordingly.

## 2016-07-17 NOTE — Progress Notes (Signed)
Follow-up Note  RE: Gregory Collins MRN: [id number] DOB: 1970-11-07 Date of Office Visit: 07/17/2016  Primary care provider: Garret Reddish, MD Referring provider: Marin Olp, MD  History of present illness: Gregory Collins is a 46 y.o. male with persistent asthma and allergic rhinitis presenting today for follow up.  He was last seen in this clinic in May 2017.  He reports that in the interval since his previous visit his upper and lower respiratory symptoms have been well-controlled.  He rarely requires albuterol rescue and denies nocturnal awakenings due to lower respiratory symptoms.  He uses Qvar 40 g, one inhalation via spacer device twice a day, and albuterol prior to exercise.  He has no nasal symptom complaints today.   Assessment and plan: Mild persistent asthma Well-controlled.  Continue Qvar 40 g, one inhalation via spacer device twice a day, and albuterol HFA, 1-2 inhalations every 4-6 hours as needed and 15 minutes prior to exercise.  During respiratory tract infections or asthma flares, add Qvar 40g 3 inhalations 2 times per day until symptoms have returned to baseline.  Subjective and objective measures of pulmonary function will be followed and the treatment plan will be adjusted accordingly.  Allergic rhinitis Stable.  Continue appropriate allergen avoidance measures and over-the-counter antihistamines as needed.   Meds ordered this encounter  Medications  . albuterol (PROAIR HFA) 108 (90 Base) MCG/ACT inhaler    Sig: Inhale 2 puffs into the lungs every 4 (four) hours as needed for wheezing or shortness of breath.    Dispense:  8.5 Inhaler    Refill:  1  . beclomethasone (QVAR) 40 MCG/ACT inhaler    Sig: Inhale 1 puff into the lungs 2 (two) times daily.    Dispense:  1 Inhaler    Refill:  2    Diagnostics: Spirometry:  Normal with an FEV1 of 106% predicted.  Please see scanned spirometry results for details.    Physical  examination: Blood pressure 112/70, pulse 86, temperature 97.8 F (36.6 C), temperature source Oral, resp. rate 14, height 5' 6.5" (1.689 m), weight 197 lb (89.4 kg), SpO2 98 %.  General: Alert, interactive, in no acute distress. HEENT: TMs pearly gray, turbinates mildly edematous without discharge, post-pharynx unremarkable. Neck: Supple without lymphadenopathy. Lungs: Clear to auscultation without wheezing, rhonchi or rales. CV: Normal S1, S2 without murmurs. Skin: Warm and dry, without lesions or rashes.  The following portions of the patient's history were reviewed and updated as appropriate: allergies, current medications, past family history, past medical history, past social history, past surgical history and problem list.  Allergies as of 07/17/2016   No Known Allergies     Medication List       Accurate as of 07/17/16  2:08 PM. Always use your most recent med list.          albuterol 108 (90 Base) MCG/ACT inhaler Commonly known as:  PROAIR HFA Inhale 2 puffs into the lungs every 4 (four) hours as needed for wheezing or shortness of breath.   aspirin EC 81 MG tablet Take by mouth.   atorvastatin 20 MG tablet Commonly known as:  LIPITOR Take 1 tablet (20 mg total) by mouth daily.   beclomethasone 40 MCG/ACT inhaler Commonly known as:  QVAR Inhale 1 puff into the lungs 2 (two) times daily.   cyanocobalamin 1000 MCG/ML injection Commonly known as:  (VITAMIN B-12) Inject 1 mL (1,000 mcg total) into the muscle every 30 (thirty) days. Pharmacy-please provide 3 cc syringes  and 25 gauge 1 inch needles   lansoprazole 30 MG capsule Commonly known as:  PREVACID Take 1 capsule (30 mg total) by mouth daily at 12 noon.   lisinopril 20 MG tablet Commonly known as:  PRINIVIL,ZESTRIL TAKE 1 TABLET (20 MG TOTAL) BY MOUTH DAILY.   metoprolol tartrate 25 MG tablet Commonly known as:  LOPRESSOR Take 1 tablet (25 mg total) by mouth 2 (two) times daily.       No Known  Allergies  I appreciate the opportunity to take part in Brenan's care. Please do not hesitate to contact me with questions.  Sincerely,   R. Edgar Frisk, MD

## 2016-07-17 NOTE — Patient Instructions (Signed)
Mild persistent asthma Well-controlled.  Continue Qvar 40 g, one inhalation via spacer device twice a day, and albuterol HFA, 1-2 inhalations every 4-6 hours as needed and 15 minutes prior to exercise.  During respiratory tract infections or asthma flares, add Qvar 40g 3 inhalations 2 times per day until symptoms have returned to baseline.  Subjective and objective measures of pulmonary function will be followed and the treatment plan will be adjusted accordingly.  Allergic rhinitis Stable.  Continue appropriate allergen avoidance measures and over-the-counter antihistamines as needed.   Return in about 4 months (around 11/14/2016), or if symptoms worsen or fail to improve.

## 2016-07-17 NOTE — Assessment & Plan Note (Signed)
Stable.  Continue appropriate allergen avoidance measures and over-the-counter antihistamines as needed. 

## 2016-07-28 ENCOUNTER — Encounter: Payer: Self-pay | Admitting: Family Medicine

## 2016-07-28 ENCOUNTER — Ambulatory Visit (INDEPENDENT_AMBULATORY_CARE_PROVIDER_SITE_OTHER): Payer: BC Managed Care – PPO | Admitting: Family Medicine

## 2016-07-28 DIAGNOSIS — N529 Male erectile dysfunction, unspecified: Secondary | ICD-10-CM | POA: Insufficient documentation

## 2016-07-28 DIAGNOSIS — N5201 Erectile dysfunction due to arterial insufficiency: Secondary | ICD-10-CM | POA: Diagnosis not present

## 2016-07-28 MED ORDER — SILDENAFIL CITRATE 100 MG PO TABS: 100.0000 mg | ORAL_TABLET | Freq: Every day | ORAL | 11 refills | Status: DC | PRN

## 2016-07-28 NOTE — Progress Notes (Signed)
Pre visit review using our clinic review tool, if applicable. No additional management support is needed unless otherwise documented below in the visit note. 

## 2016-07-28 NOTE — Progress Notes (Signed)
Subjective:  Jarry Mihalek is a 46 y.o. year old very pleasant male patient who presents for/with See problem oriented charting ROS- No chest pain or shortness of breath (occasional issues with asthma though and albuterol helps). No headache or blurry vision.    Past Medical History-  Patient Active Problem List   Diagnosis Date Noted  . Diabetes mellitus without complication ([place]) XX123456    Priority: High  . Mild persistent asthma 08/03/2015    Priority: Medium  . Sinus tachycardia 06/29/2015    Priority: Medium  . Pernicious anemia 03/29/2015    Priority: Medium  . Hyperlipidemia 11/10/2014    Priority: Medium  . Essential hypertension 11/10/2014    Priority: Medium  . GERD (gastroesophageal reflux disease) 06/20/2011    Priority: Medium  . Allergic rhinitis 11/16/2015    Priority: Low  . Migraine 08/20/2011    Priority: Low  . Erectile dysfunction 07/28/2016    Medications- reviewed and updated Current Outpatient Prescriptions  Medication Sig Dispense Refill  . albuterol (PROAIR HFA) 108 (90 Base) MCG/ACT inhaler Inhale 2 puffs into the lungs every 4 (four) hours as needed for wheezing or shortness of breath. 8.5 Inhaler 1  . aspirin EC 81 MG tablet Take by mouth.    Marland Kitchen atorvastatin (LIPITOR) 20 MG tablet Take 1 tablet (20 mg total) by mouth daily. 90 tablet 3  . beclomethasone (QVAR) 40 MCG/ACT inhaler Inhale 1 puff into the lungs 2 (two) times daily. 1 Inhaler 2  . cyanocobalamin (,VITAMIN B-12,) 1000 MCG/ML injection Inject 1 mL (1,000 mcg total) into the muscle every 30 (thirty) days. Pharmacy-please provide 3 cc syringes and 25 gauge 1 inch needles 4 mL 0  . lansoprazole (PREVACID) 30 MG capsule Take 1 capsule (30 mg total) by mouth daily at 12 noon. (Patient taking differently: Take 30 mg by mouth 2 (two) times daily before a meal. ) 90 capsule 3  . lisinopril (PRINIVIL,ZESTRIL) 20 MG tablet TAKE 1 TABLET (20 MG TOTAL) BY MOUTH DAILY. 90 tablet 3  . metoprolol  tartrate (LOPRESSOR) 25 MG tablet Take 1 tablet (25 mg total) by mouth 2 (two) times daily. 180 tablet 3  . sildenafil (VIAGRA) 100 MG tablet Take 1 tablet (100 mg total) by mouth daily as needed for erectile dysfunction. 10 tablet 11   Current Facility-Administered Medications  Medication Dose Route Frequency Provider Last Rate Last Dose  . 0.9 %  sodium chloride infusion  500 mL Intravenous Continuous Jerene Bears, MD        Objective: BP 126/64 (BP Location: Left Arm, Patient Position: Sitting, Cuff Size: Large)   Pulse (!) 102   Temp 98.1 F (36.7 C) (Oral)   Ht 5' 6.5" (1.689 m)   Wt 198 lb 6.4 oz (90 kg)   SpO2 98%   BMI 31.54 kg/m  Gen: NAD, resting comfortably HR normal on my exam (admits to anxiety about topic)  Assessment/Plan:  Erectile dysfunction S: 2 weeks ago started with difficulty getting an erection. Was able to get an erection and then went soft again shortly after. Another time couldn't get an erection at all- last 3 days has not been able to at all.   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 Last week was slightly sleepier - wondered if he had the flu but got over it quickly. Still walking 3x a week.   Father had BPH issues he thinks- he was reassured these issues were unrelated  A/P:  High risk for ED issues with HTN, HLD, obesity. Started mainly within 2 weeks after one episode having a hard time getting erection- suspect some of this is mental block or "performance anxiety". We discussed exercise, healthy eating, weight loss, may help but in short run will start viagra 100mg - try half tablet to start. If too expensive will send in sildenafil 20mg - he wanted to try viagra/sildenafil 100mg  version first.   Meds ordered this encounter  Medications  . sildenafil (VIAGRA) 100 MG tablet    Sig: Take 1 tablet (100 mg total) by mouth daily as needed for erectile dysfunction.    Dispense:  10 tablet    Refill:  11   The duration  of face-to-face time during this visit was greater than 15 minutes. Greater than 50% of this time was spent in counseling about ED, medication options.    Return precautions advised.  Garret Reddish, MD

## 2016-07-28 NOTE — Assessment & Plan Note (Signed)
S: 2 weeks ago started with difficulty getting an erection. Was able to get an erection and then went soft again shortly after. Another time couldn't get an erection at all- last 3 days has not been able to at all.   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 Last week was slightly sleepier - wondered if he had the flu but got over it quickly. Still walking 3x a week.   Father had BPH issues he thinks- he was reassured these issues were unrelated  A/P: High risk for ED issues with HTN, HLD, obesity. Started mainly within 2 weeks after one episode having a hard time getting erection- suspect some of this is mental block or "performance anxiety". We discussed exercise, healthy eating, weight loss, may help but in short run will start viagra 100mg - try half tablet to start. If too expensive will send in sildenafil 20mg - he wanted to try viagra/sildenafil 100mg  version first.

## 2016-07-28 NOTE — Patient Instructions (Signed)
Goal weight 185 within 6 months. Try to increase exercise to 5 days a week. Focus on fruits/veggies.    For erectile issues- may trial viagra. Would start with half pill. If that doesn't work, may use fill pill.

## 2016-07-29 ENCOUNTER — Encounter: Payer: Self-pay | Admitting: Family Medicine

## 2016-07-31 MED ORDER — TADALAFIL 20 MG PO TABS: 10.0000 mg | ORAL_TABLET | ORAL | 11 refills | Status: DC | PRN

## 2016-08-29 ENCOUNTER — Other Ambulatory Visit: Payer: Self-pay | Admitting: *Deleted

## 2016-08-29 MED ORDER — BECLOMETHASONE DIPROP HFA 40 MCG/ACT IN AERB: 2.0000 | INHALATION_SPRAY | Freq: Two times a day (BID) | RESPIRATORY_TRACT | 3 refills | Status: DC

## 2016-09-05 ENCOUNTER — Other Ambulatory Visit: Payer: Self-pay

## 2016-09-05 MED ORDER — FLUTICASONE PROPIONATE HFA 44 MCG/ACT IN AERO: 1.0000 | INHALATION_SPRAY | Freq: Two times a day (BID) | RESPIRATORY_TRACT | 5 refills | Status: DC

## 2016-09-05 NOTE — Telephone Encounter (Signed)
We received a fax from [facility] in regards to discontinuing Qvar 40 1 puff twice a day. I sent in a prescription for Flovent 44 into [facility]; instructions 1 puff twice a day.

## 2016-09-15 LAB — HM DIABETES EYE EXAM

## 2016-10-03 ENCOUNTER — Ambulatory Visit (INDEPENDENT_AMBULATORY_CARE_PROVIDER_SITE_OTHER): Payer: BC Managed Care – PPO | Admitting: Family Medicine

## 2016-10-03 ENCOUNTER — Encounter: Payer: Self-pay | Admitting: Family Medicine

## 2016-10-03 VITALS — BP 118/84 | HR 69 | Temp 98.0°F | Ht 66.5 in | Wt 197.8 lb

## 2016-10-03 DIAGNOSIS — K5900 Constipation, unspecified: Secondary | ICD-10-CM

## 2016-10-03 NOTE — Progress Notes (Signed)
Subjective:  Gregory Collins is a 46 y.o. year old very pleasant male patient who presents for/with See problem oriented charting ROS- no blood in stool, melena, worsening abdominal pain, fever, nausea, vomiting . No small caliber stools  Past Medical History-  Patient Active Problem List   Diagnosis Date Noted  . Diabetes mellitus without complication ([place]) 01/60/1093    Priority: High  . Mild persistent asthma 08/03/2015    Priority: Medium  . Sinus tachycardia 06/29/2015    Priority: Medium  . Pernicious anemia 03/29/2015    Priority: Medium  . Hyperlipidemia 11/10/2014    Priority: Medium  . Essential hypertension 11/10/2014    Priority: Medium  . GERD (gastroesophageal reflux disease) 06/20/2011    Priority: Medium  . Allergic rhinitis 11/16/2015    Priority: Low  . Migraine 08/20/2011    Priority: Low  . Erectile dysfunction 07/28/2016    Medications- reviewed and updated Current Outpatient Prescriptions  Medication Sig Dispense Refill  . albuterol (PROAIR HFA) 108 (90 Base) MCG/ACT inhaler Inhale 2 puffs into the lungs every 4 (four) hours as needed for wheezing or shortness of breath. 8.5 Inhaler 1  . aspirin EC 81 MG tablet Take by mouth.    Marland Kitchen atorvastatin (LIPITOR) 20 MG tablet Take 1 tablet (20 mg total) by mouth daily. 90 tablet 3  . beclomethasone (QVAR) 40 MCG/ACT inhaler Inhale 1 puff into the lungs 2 (two) times daily. 1 Inhaler 2  . Beclomethasone Diprop HFA (QVAR REDIHALER) 40 MCG/ACT AERB Inhale 2 puffs into the lungs 2 (two) times daily. 1 Inhaler 3  . cyanocobalamin (,VITAMIN B-12,) 1000 MCG/ML injection Inject 1 mL (1,000 mcg total) into the muscle every 30 (thirty) days. Pharmacy-please provide 3 cc syringes and 25 gauge 1 inch needles 4 mL 0  . fluticasone (FLOVENT HFA) 44 MCG/ACT inhaler Inhale 1 puff into the lungs 2 (two) times daily. 1 Inhaler 5  . lansoprazole (PREVACID) 30 MG capsule Take 1 capsule (30 mg total) by mouth daily at 12 noon.  (Patient taking differently: Take 30 mg by mouth 2 (two) times daily before a meal. ) 90 capsule 3  . lisinopril (PRINIVIL,ZESTRIL) 20 MG tablet TAKE 1 TABLET (20 MG TOTAL) BY MOUTH DAILY. 90 tablet 3  . metoprolol tartrate (LOPRESSOR) 25 MG tablet Take 1 tablet (25 mg total) by mouth 2 (two) times daily. 180 tablet 3  . tadalafil (CIALIS) 20 MG tablet Take 0.5-1 tablets (10-20 mg total) by mouth every other day as needed for erectile dysfunction. 5 tablet 11   Current Facility-Administered Medications  Medication Dose Route Frequency Provider Last Rate Last Dose  . 0.9 %  sodium chloride infusion  500 mL Intravenous Continuous Jerene Bears, MD        Objective: BP 118/84 (BP Location: Left Arm, Patient Position: Sitting, Cuff Size: Large)   Pulse 69   Temp 98 F (36.7 C) (Oral)   Ht 5' 6.5" (1.689 m)   Wt 197 lb 12.8 oz (89.7 kg)   SpO2 97%   BMI 31.45 kg/m  Gen: NAD, resting comfortably Mucous membranes are moist. CV: RRR no murmurs rubs or gallops Lungs: CTAB no crackles, wheeze, rhonchi Abdomen: s Ext: no edemaoft/nontender/nondistended/normal bowel sounds. No rebound or guarding.  Skin: warm, dry, no rash Assessment/Plan:  Constipation, unspecified constipation type S:  back in October patient had noted some intermittent RUQ abdominal pain lasting a few minutes then resolving. Was having more regular bowel movements with yogurt but still had firm  stools. Labs were reassuring.  We had discussed doing RUQ u/s if persisted.   Had an EGD on 06/14/16 with Dr. Levie Heritage and noted 2 cm hiatal hernia. History of atrophic gastritis with intestinal metaplasia- chronic gastritis with atrophy was again noted. Patient has been compliant with prevacid 30mg  BID.   Today, Prior abdominal pain has improved previously a few times a week, now a few times a month.  But reports 3 weeks ago that he felt like he needed to go to the restroom and had some stomach discomfort. Got some tablets for bloating  and it felt better. Then seemed to have a hard time having a bowel movement- stool softener [facility]. Took those for a few days and got better. Has had some straining to get things started. Still has Bm everyday.   Reports staying well hydrated- about 64 oz a day. Exercising 2x a week. Getting 1-2 servings of fruit and 1-2 servings of veggies a day A/P: Constipation presenting with firm stools but having daily BM. h20 intake is reasonable. Exercise reasonable. We discussed bumping fiber intake by getting more fruits/veggies- at least 5 servings a day. If still having issues- can add colace. If issues with both 5 servings fruits and veggies and colace- may ask for Dr. Garth Schlatter opinion- but we discussed 6 month trial first and he should be seeing me before then for CPE anyway. ROS that is discussed above was also noted as red flags to call us sooner  Had eye exam- to get faxed to [place]  Declined Tdap  The duration of face-to-face time during this visit was greater than 15 minutes. Greater than 50% of this time was spent in counseling, explanation of diagnosis, planning of further management, and/or coordination of care including discussion of how to improve stool firmness, patient concerns about what this could mean.    Return precautions advised.  Garret Reddish, MD

## 2016-10-03 NOTE — Progress Notes (Signed)
Pre visit review using our clinic review tool, if applicable. No additional management support is needed unless otherwise documented below in the visit note. 

## 2016-10-03 NOTE — Patient Instructions (Addendum)
If you have had your eye exam within the last year, please sign release of information at check out desk. If you have not had an eye exam within a year, please get one at this time as this is important for your diabetes care  Also due for tetanus shot. Declined today ________________________________________________________  Increase vegetables to 3 servings a day at least. And try to get 2 servings of fruit consistently.   This likely will help bowel movements.   If not can add colace/docusate- stool softener 1-2x a day. If still having issues 3-6 months from now could reach out to Dr. Hilarie Fredrickson but I do doubt he would want to do early colonscopy

## 2016-10-10 ENCOUNTER — Other Ambulatory Visit: Payer: Self-pay | Admitting: Internal Medicine

## 2016-10-10 ENCOUNTER — Other Ambulatory Visit: Payer: Self-pay | Admitting: *Deleted

## 2016-10-10 DIAGNOSIS — E538 Deficiency of other specified B group vitamins: Secondary | ICD-10-CM

## 2016-10-10 MED ORDER — CYANOCOBALAMIN 1000 MCG/ML IJ SOLN: 1000.0000 ug | INTRAMUSCULAR | 0 refills | Status: DC

## 2016-10-10 NOTE — Telephone Encounter (Signed)
Pt calling for refill on B12. Pt asking if Dr. Hilarie Fredrickson wants him to have his B12 level checked prior to refilling script. Please advise. Pt has not had an OV since July 2016. Please advise.

## 2016-10-16 ENCOUNTER — Other Ambulatory Visit (INDEPENDENT_AMBULATORY_CARE_PROVIDER_SITE_OTHER): Payer: BC Managed Care – PPO

## 2016-10-16 DIAGNOSIS — E538 Deficiency of other specified B group vitamins: Secondary | ICD-10-CM

## 2016-10-16 LAB — VITAMIN B12: Vitamin B-12: 457 pg/mL (ref 211–911)

## 2016-10-17 ENCOUNTER — Other Ambulatory Visit: Payer: Self-pay

## 2016-10-17 DIAGNOSIS — E538 Deficiency of other specified B group vitamins: Secondary | ICD-10-CM

## 2016-10-31 ENCOUNTER — Ambulatory Visit: Payer: BC Managed Care – PPO | Admitting: Family Medicine

## 2016-11-01 ENCOUNTER — Ambulatory Visit (INDEPENDENT_AMBULATORY_CARE_PROVIDER_SITE_OTHER): Payer: BC Managed Care – PPO | Admitting: Family Medicine

## 2016-11-01 ENCOUNTER — Encounter: Payer: Self-pay | Admitting: Family Medicine

## 2016-11-01 VITALS — BP 124/82 | HR 78 | Temp 97.7°F | Ht 66.5 in | Wt 196.8 lb

## 2016-11-01 DIAGNOSIS — K6289 Other specified diseases of anus and rectum: Secondary | ICD-10-CM | POA: Diagnosis not present

## 2016-11-01 DIAGNOSIS — K625 Hemorrhage of anus and rectum: Secondary | ICD-10-CM | POA: Diagnosis not present

## 2016-11-01 DIAGNOSIS — E119 Type 2 diabetes mellitus without complications: Secondary | ICD-10-CM

## 2016-11-01 LAB — POC URINALSYSI DIPSTICK (AUTOMATED)
Bilirubin, UA: NEGATIVE
Glucose, UA: NEGATIVE
KETONES UA: NEGATIVE
Leukocytes, UA: NEGATIVE
Nitrite, UA: NEGATIVE
PROTEIN UA: NEGATIVE
RBC UA: NEGATIVE
UROBILINOGEN UA: 0.2 U/dL
pH, UA: 6 (ref 5.0–8.0)

## 2016-11-01 LAB — CBC WITH DIFFERENTIAL/PLATELET
BASOS PCT: 0.7 % (ref 0.0–3.0)
Basophils Absolute: 0 10*3/uL (ref 0.0–0.1)
EOS ABS: 0.5 10*3/uL (ref 0.0–0.7)
EOS PCT: 9 % — AB (ref 0.0–5.0)
HCT: 42.8 % (ref 39.0–52.0)
Hemoglobin: 14 g/dL (ref 13.0–17.0)
LYMPHS ABS: 1.7 10*3/uL (ref 0.7–4.0)
Lymphocytes Relative: 30.7 % (ref 12.0–46.0)
MCHC: 32.8 g/dL (ref 30.0–36.0)
MCV: 85.5 fl (ref 78.0–100.0)
MONO ABS: 0.5 10*3/uL (ref 0.1–1.0)
Monocytes Relative: 9 % (ref 3.0–12.0)
NEUTROS PCT: 50.6 % (ref 43.0–77.0)
Neutro Abs: 2.8 10*3/uL (ref 1.4–7.7)
Platelets: 209 10*3/uL (ref 150.0–400.0)
RBC: 5.01 Mil/uL (ref 4.22–5.81)
RDW: 14.1 % (ref 11.5–15.5)
WBC: 5.5 10*3/uL (ref 4.0–10.5)

## 2016-11-01 LAB — COMPREHENSIVE METABOLIC PANEL
ALT: 22 U/L (ref 0–53)
AST: 16 U/L (ref 0–37)
Albumin: 4.6 g/dL (ref 3.5–5.2)
Alkaline Phosphatase: 61 U/L (ref 39–117)
BILIRUBIN TOTAL: 0.5 mg/dL (ref 0.2–1.2)
BUN: 15 mg/dL (ref 6–23)
CHLORIDE: 99 meq/L (ref 96–112)
CO2: 28 mEq/L (ref 19–32)
CREATININE: 0.98 mg/dL (ref 0.40–1.50)
Calcium: 9.4 mg/dL (ref 8.4–10.5)
GFR: 106.68 mL/min (ref >60.00)
Glucose, Bld: 132 mg/dL — ABNORMAL HIGH (ref 70–99)
Potassium: 4.3 mEq/L (ref 3.5–5.1)
SODIUM: 135 meq/L (ref 135–145)
TOTAL PROTEIN: 7.4 g/dL (ref 6.0–8.3)

## 2016-11-01 LAB — PSA: PSA: 2.54 ng/mL (ref 0.10–4.00)

## 2016-11-01 MED ORDER — DILTIAZEM GEL 2 %: 1.0000 "application " | Freq: Three times a day (TID) | CUTANEOUS | 2 refills | Status: DC

## 2016-11-01 NOTE — Progress Notes (Signed)
Pre visit review using our clinic review tool, if applicable. No additional management support is needed unless otherwise documented below in the visit note. 

## 2016-11-01 NOTE — Patient Instructions (Addendum)
Gel 3x a day for 8 weeks. New or worsening symptoms let us know. This is a slow healing process if this is a fissure.   Could not see an obvious hemorrhoid  We will call you within a week or two about your referral to GI. If you do not hear within 3 weeks, give [place] a call.   Please stop by lab before you go

## 2016-11-01 NOTE — Progress Notes (Signed)
Subjective:  Gregory Collins is a 46 y.o. year old very pleasant male patient who presents for/with See problem oriented charting ROS- no abdominal pain. No blood in urine. No dysuria. No ugency.    Past Medical History-  Patient Active Problem List   Diagnosis Date Noted  . Diabetes mellitus without complication ([place]) 03/47/4259    Priority: High  . Mild persistent asthma 08/03/2015    Priority: Medium  . Sinus tachycardia 06/29/2015    Priority: Medium  . Pernicious anemia 03/29/2015    Priority: Medium  . Hyperlipidemia 11/10/2014    Priority: Medium  . Essential hypertension 11/10/2014    Priority: Medium  . GERD (gastroesophageal reflux disease) 06/20/2011    Priority: Medium  . Allergic rhinitis 11/16/2015    Priority: Low  . Migraine 08/20/2011    Priority: Low  . Erectile dysfunction 07/28/2016    Medications- reviewed and updated Current Outpatient Prescriptions  Medication Sig Dispense Refill  . albuterol (PROAIR HFA) 108 (90 Base) MCG/ACT inhaler Inhale 2 puffs into the lungs every 4 (four) hours as needed for wheezing or shortness of breath. 8.5 Inhaler 1  . aspirin EC 81 MG tablet Take by mouth.    Marland Kitchen atorvastatin (LIPITOR) 20 MG tablet Take 1 tablet (20 mg total) by mouth daily. 90 tablet 3  . beclomethasone (QVAR) 40 MCG/ACT inhaler Inhale 1 puff into the lungs 2 (two) times daily. 1 Inhaler 2  . Beclomethasone Diprop HFA (QVAR REDIHALER) 40 MCG/ACT AERB Inhale 2 puffs into the lungs 2 (two) times daily. 1 Inhaler 3  . cyanocobalamin (,VITAMIN B-12,) 1000 MCG/ML injection Inject 1 mL (1,000 mcg total) into the muscle every 30 (thirty) days. Pharmacy-please provide 3 cc syringes and 25 gauge 1 inch needles 4 mL 0  . fluticasone (FLOVENT HFA) 44 MCG/ACT inhaler Inhale 1 puff into the lungs 2 (two) times daily. 1 Inhaler 5  . lansoprazole (PREVACID) 30 MG capsule Take 1 capsule (30 mg total) by mouth daily at 12 noon. (Patient taking differently: Take 30 mg by  mouth 2 (two) times daily before a meal. ) 90 capsule 3  . lisinopril (PRINIVIL,ZESTRIL) 20 MG tablet TAKE 1 TABLET (20 MG TOTAL) BY MOUTH DAILY. 90 tablet 3  . metoprolol tartrate (LOPRESSOR) 25 MG tablet Take 1 tablet (25 mg total) by mouth 2 (two) times daily. 180 tablet 3  . tadalafil (CIALIS) 20 MG tablet Take 0.5-1 tablets (10-20 mg total) by mouth every other day as needed for erectile dysfunction. 5 tablet 11  . diltiazem 2 % GEL Apply 1 application topically 3 (three) times daily. To rectum for presumed anal fissure for 8 weeks 30 g 2   No current facility-administered medications for this visit.     Objective: BP 124/82 (BP Location: Left Arm, Patient Position: Sitting, Cuff Size: Large)   Pulse 78   Temp 97.7 F (36.5 C) (Oral)   Ht 5' 6.5" (1.689 m)   Wt 196 lb 12.8 oz (89.3 kg)   SpO2 97%   BMI 31.29 kg/m  Gen: NAD, resting comfortably CV: RRR no murmurs rubs or gallops Lungs: CTAB no crackles, wheeze, rhonchi Abdomen: soft/nontender/nondistended/normal bowel sounds. No rebound or guarding.  Ext: no edema Rectal: external exam reveals no obvious hemorrhoids. At 6 o clock on rectum- appears to have a very slight fissure. Patient did not tolerate attempt at closer exam or internal rectal exam- he jumped onto table and literally pulled my finger away from his rectum  Assessment/Plan:  Rectal  bleeding - Plan: Ambulatory referral to Gastroenterology Rectal pain - Plan: PSA S: Firm stools have improved- picked up fruits/veggies to 5 servings. For last 3 days has developed pain with bowel movement in rectal area. Pain up to 7/10. He wants to avoid bowel movements completley as a result.  Has been straining but hadn't been in a while. Started with stool softener and does not feel like this helps. Has noted some blood in a few stools- with wiping and in the stool. Stools remain brown. No melena.  A/P: I suspect this may be a fissure with his level of pain/discomfort. He states has  had rectal exam before and was not nearly as painful (as stated in exam- essentially guarding). Unable to e prescribe nifedipine and Nitroglycerin not on formulary not on formulary. We opted to use diltiazem gel. He was not agreeable to anoscopy trial (and given didn't tolerate rectal exam would not have made sense) . Due to blood noted in stools we will refer to GI per his request- has seen Dr. Hilarie Fredrickson for endocscopy in the past.   patient requests bloodwork. I told him probability of anemia with light amount of blodo he has noted is low. He also asks for psa though I was not able to get a good exam of the prostate- last PSA 3 years ago so reasonable to update.   Orders Placed This Encounter  Procedures  . Comprehensive metabolic panel    [place]  . CBC with Differential/Platelet  . PSA  . Ambulatory referral to Gastroenterology    Referral Priority:   Routine    Referral Type:   Consultation    Referral Reason:   Specialty Services Required    Number of Visits Requested:   1  . POCT Urinalysis Dipstick (Automated)    Meds ordered this encounter  Medications  . diltiazem 2 % GEL    Sig: Apply 1 application topically 3 (three) times daily. To rectum for presumed anal fissure for 8 weeks    Dispense:  30 g    Refill:  2    Return precautions advised.  Garret Reddish, MD

## 2016-11-03 ENCOUNTER — Telehealth: Payer: Self-pay | Admitting: Family Medicine

## 2016-11-03 ENCOUNTER — Other Ambulatory Visit: Payer: Self-pay

## 2016-11-03 DIAGNOSIS — R972 Elevated prostate specific antigen [PSA]: Secondary | ICD-10-CM

## 2016-11-03 NOTE — Telephone Encounter (Signed)
Spoke with patient and reviewed over lab results 

## 2016-11-03 NOTE — Telephone Encounter (Signed)
Pt returning your phone call. Please call back

## 2016-11-23 ENCOUNTER — Encounter: Payer: Self-pay | Admitting: Family Medicine

## 2016-11-24 ENCOUNTER — Encounter: Payer: Self-pay | Admitting: *Deleted

## 2016-11-29 ENCOUNTER — Encounter: Payer: Self-pay | Admitting: Family Medicine

## 2016-11-30 ENCOUNTER — Encounter: Payer: Self-pay | Admitting: Family Medicine

## 2016-12-06 ENCOUNTER — Encounter: Payer: Self-pay | Admitting: Internal Medicine

## 2016-12-12 ENCOUNTER — Encounter: Payer: BC Managed Care – PPO | Admitting: Family Medicine

## 2016-12-14 ENCOUNTER — Ambulatory Visit (INDEPENDENT_AMBULATORY_CARE_PROVIDER_SITE_OTHER): Payer: BC Managed Care – PPO | Admitting: Internal Medicine

## 2016-12-14 ENCOUNTER — Encounter: Payer: Self-pay | Admitting: Internal Medicine

## 2016-12-14 VITALS — BP 108/72 | HR 70 | Ht 67.0 in | Wt 198.6 lb

## 2016-12-14 DIAGNOSIS — K3189 Other diseases of stomach and duodenum: Secondary | ICD-10-CM | POA: Diagnosis not present

## 2016-12-14 DIAGNOSIS — K31A0 Gastric intestinal metaplasia, unspecified: Secondary | ICD-10-CM

## 2016-12-14 DIAGNOSIS — Z1211 Encounter for screening for malignant neoplasm of colon: Secondary | ICD-10-CM

## 2016-12-14 DIAGNOSIS — D510 Vitamin B12 deficiency anemia due to intrinsic factor deficiency: Secondary | ICD-10-CM | POA: Diagnosis not present

## 2016-12-14 DIAGNOSIS — E538 Deficiency of other specified B group vitamins: Secondary | ICD-10-CM

## 2016-12-14 MED ORDER — NA SULFATE-K SULFATE-MG SULF 17.5-3.13-1.6 GM/177ML PO SOLN: ORAL | 0 refills | Status: DC

## 2016-12-14 NOTE — Progress Notes (Signed)
   Subjective:    Patient ID: Jaelon Gatley, male    DOB: Dec 01, 1970, 46 y.o.   MRN: [id number]  HPI Garlin Batdorf is a 46 year old male with a history of pernicious anemia/atrophic gastritis with intestinal metaplasia and significant B12 deficiency, H. pylori negative, hypertension, hyperlipidemia and diabetes who is here for follow-up. He was last seen at the time of surveillance upper endoscopy which was performed on 06/14/2016. This showed normal esophagus, 2 cm hiatal hernia, gastric mucosal atrophy without polyps and multiple surveillance biopsies were performed. Biopsies showed chronic gastritis with atrophy and mild reactive gastropathy. There was intestinal metaplasia in the gastric body, cardia and fundus. There was no dysplasia or malignancy. There is no H. Pylori.  He reports that he has been doing well. He has been using his IM B12 every 30 days. He continues lansoprazole 30 mg once daily. He has been feeling well. Over a month ago he developed some constipation and passed a hard large stool. With this he had anal pain and bleeding. The pain persisted for several days. He was seen by primary care and diagnosed with fissure. He was treated with diltiazem gel and symptoms have resolved completely. He's had no further constipation. Stools are regular without blood. No further perianal or rectal pain. No abdominal pain. Good appetite. No nausea, vomiting or early satiety. No trouble swallowing.  Review of Systems As per history of present illness, otherwise negative  Current Medications, Allergies, Past Medical History, Past Surgical History, Family History and Social History were reviewed in [employer] record.     Objective:   Physical Exam BP 108/72   Pulse 70   Ht 5\' 7"  (1.702 m)   Wt 198 lb 9.6 oz (90.1 kg)   BMI 31.11 kg/m  Constitutional: Well-developed and well-nourished. No distress. HEENT: Normocephalic and atraumatic. Oropharynx is clear and  moist. Conjunctivae are normal.  No scleral icterus. Neck: Neck supple. Trachea midline. Cardiovascular: Normal rate, regular rhythm and intact distal pulses. No M/R/G Pulmonary/chest: Effort normal and breath sounds normal. No wheezing, rales or rhonchi. Abdominal: Soft, nontender, nondistended. Bowel sounds active throughout. There are no masses palpable. No hepatosplenomegaly. Extremities: no clubbing, cyanosis, or edema Neurological: Alert and oriented to person place and time. Skin: Skin is warm and dry. Psychiatric: Normal mood and affect. Behavior is normal.  Lab Results  Component Value Date   VITAMINB12 457 10/16/2016       Assessment & Plan:  46 year old male with a history of pernicious anemia/atrophic gastritis with intestinal metaplasia and significant B12 deficiency, H. pylori negative, hypertension, hyperlipidemia and diabetes who is here for follow-up.  1. B12 deficiency/pernicious anemia/atrophic gastritis/GERD -- agree with surveillance endoscopy for biopsy every 2 years given metaplasia. Continue IM B12 every month. Continue lansoprazole 30 mg daily.  2. Colon cancer screening -- he will be 46 this year. I recommended average risk colon cancer screening. We discussed how screening has moved age 73 for African Americans and may move to 63 for all adults. We discussed the risks, benefits and alternatives and he is agreeable to proceed.  3. Anal fissure -- resolved entirely with diltiazem gel.  25 minutes spent with the patient today. Greater than 50% was spent in counseling and coordination of care with the patient

## 2016-12-14 NOTE — Patient Instructions (Signed)
You have been scheduled for a colonoscopy. Please follow written instructions given to you at your visit today.  Please pick up your prep supplies at the pharmacy within the next 1-3 days. If you use inhalers (even only as needed), please bring them with you on the day of your procedure. Your physician has requested that you go to www.[email or web address].com and enter the access code given to you at your visit today. This web site gives a general overview about your procedure. However, you should still follow specific instructions given to you by our office regarding your preparation for the procedure.  Continue b12 injections.  If you are age 61 or older, your body mass index should be between 23-30. Your Body mass index is 31.11 kg/m. If this is out of the aforementioned range listed, please consider follow up with your Primary Care Provider.  If you are age 17 or younger, your body mass index should be between 19-25. Your Body mass index is 31.11 kg/m. If this is out of the aformentioned range listed, please consider follow up with your Primary Care Provider.

## 2016-12-19 ENCOUNTER — Ambulatory Visit (INDEPENDENT_AMBULATORY_CARE_PROVIDER_SITE_OTHER): Payer: BC Managed Care – PPO | Admitting: Family Medicine

## 2016-12-19 ENCOUNTER — Encounter: Payer: Self-pay | Admitting: Family Medicine

## 2016-12-19 VITALS — BP 116/68 | HR 69 | Temp 98.1°F | Ht 68.0 in | Wt 195.8 lb

## 2016-12-19 DIAGNOSIS — D510 Vitamin B12 deficiency anemia due to intrinsic factor deficiency: Secondary | ICD-10-CM | POA: Diagnosis not present

## 2016-12-19 DIAGNOSIS — I10 Essential (primary) hypertension: Secondary | ICD-10-CM | POA: Diagnosis not present

## 2016-12-19 DIAGNOSIS — E785 Hyperlipidemia, unspecified: Secondary | ICD-10-CM

## 2016-12-19 DIAGNOSIS — E119 Type 2 diabetes mellitus without complications: Secondary | ICD-10-CM

## 2016-12-19 DIAGNOSIS — Z0000 Encounter for general adult medical examination without abnormal findings: Secondary | ICD-10-CM | POA: Diagnosis not present

## 2016-12-19 LAB — LIPID PANEL
Cholesterol: 173 mg/dL (ref 0–200)
HDL: 34.6 mg/dL — ABNORMAL LOW (ref >39.00)
NonHDL: 138.51
Total CHOL/HDL Ratio: 5
Triglycerides: 204 mg/dL — ABNORMAL HIGH (ref 0.0–149.0)
VLDL: 40.8 mg/dL — ABNORMAL HIGH (ref 0.0–40.0)

## 2016-12-19 LAB — COMPREHENSIVE METABOLIC PANEL
ALT: 18 U/L (ref 0–53)
AST: 15 U/L (ref 0–37)
Albumin: 4.8 g/dL (ref 3.5–5.2)
Alkaline Phosphatase: 59 U/L (ref 39–117)
BILIRUBIN TOTAL: 0.5 mg/dL (ref 0.2–1.2)
BUN: 16 mg/dL (ref 6–23)
CO2: 32 meq/L (ref 19–32)
CREATININE: 1 mg/dL (ref 0.40–1.50)
Calcium: 9.6 mg/dL (ref 8.4–10.5)
Chloride: 100 mEq/L (ref 96–112)
GFR: 104.16 mL/min (ref >60.00)
GLUCOSE: 118 mg/dL — AB (ref 70–99)
Potassium: 4.3 mEq/L (ref 3.5–5.1)
SODIUM: 137 meq/L (ref 135–145)
Total Protein: 7.5 g/dL (ref 6.0–8.3)

## 2016-12-19 LAB — CBC
HCT: 41.9 % (ref 39.0–52.0)
HEMOGLOBIN: 14.1 g/dL (ref 13.0–17.0)
MCHC: 33.5 g/dL (ref 30.0–36.0)
MCV: 85.3 fl (ref 78.0–100.0)
PLATELETS: 222 10*3/uL (ref 150.0–400.0)
RBC: 4.92 Mil/uL (ref 4.22–5.81)
RDW: 14.2 % (ref 11.5–15.5)
WBC: 5.9 10*3/uL (ref 4.0–10.5)

## 2016-12-19 LAB — VITAMIN B12: Vitamin B-12: 375 pg/mL (ref 211–911)

## 2016-12-19 LAB — LDL CHOLESTEROL, DIRECT: LDL DIRECT: 100 mg/dL

## 2016-12-19 LAB — HEMOGLOBIN A1C: HEMOGLOBIN A1C: 7.4 % — AB (ref 4.6–6.5)

## 2016-12-19 NOTE — Assessment & Plan Note (Signed)
S: well controlled. On no medication. We have discussed weight loss- weight down 1 lb from last visit in April here.  Lab Results  Component Value Date   HGBA1C 6.7 (H) 04/12/2016   HGBA1C 6.4 01/06/2016   HGBA1C 6.8 08/25/2015   A/P: advised need for  A1c. Get eye records. On ace-I already for renal protection

## 2016-12-19 NOTE — Progress Notes (Signed)
Phone: [phone]  Subjective:  Patient presents today for their annual physical. Chief complaint-noted.   See problem oriented charting- ROS- full  review of systems was completed and negative except for: ratre wheeze orshortness of breath with asthma. Rectal pain now resolved  The following were reviewed and entered/updated in epic: Past Medical History:  Diagnosis Date  . Allergy   . Asthma   . B12 deficiency   . Diabetes mellitus without complication ([facility])    diet controlled  . GERD (gastroesophageal reflux disease)   . Hiatal hernia   . Hyperlipidemia   . Hypertension   . Sinus tachycardia 06/29/2015   Patient Active Problem List   Diagnosis Date Noted  . Diabetes mellitus without complication ([place]) 05/39/7673    Priority: High  . Mild persistent asthma 08/03/2015    Priority: Medium  . Sinus tachycardia 06/29/2015    Priority: Medium  . Pernicious anemia 03/29/2015    Priority: Medium  . Hyperlipidemia 11/10/2014    Priority: Medium  . Essential hypertension 11/10/2014    Priority: Medium  . GERD (gastroesophageal reflux disease) 06/20/2011    Priority: Medium  . Erectile dysfunction 07/28/2016    Priority: Low  . Allergic rhinitis 11/16/2015    Priority: Low  . Migraine 08/20/2011    Priority: Low   Past Surgical History:  Procedure Laterality Date  . LEG SURGERY     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  . UPPER GASTROINTESTINAL ENDOSCOPY      Family History  Problem Relation Age of Onset  . Diabetes Mother   . Kidney disease Mother   . Diabetes Father   . Prostate cancer Father   . Hyperlipidemia Father        mother  . Hypertension Father        mother  . Colon cancer Neg Hx   . Colon polyps Neg Hx   . Esophageal cancer Neg Hx   . Rectal cancer Neg Hx   . Stomach cancer Neg Hx     Medications- reviewed and updated Current Outpatient Prescriptions  Medication Sig Dispense Refill  . albuterol (PROAIR HFA) 108 (90 Base) MCG/ACT inhaler Inhale 2 puffs  into the lungs every 4 (four) hours as needed for wheezing or shortness of breath. 8.5 Inhaler 1  . aspirin EC 81 MG tablet Take by mouth.    Marland Kitchen atorvastatin (LIPITOR) 20 MG tablet Take 1 tablet (20 mg total) by mouth daily. 90 tablet 3  . beclomethasone (QVAR) 40 MCG/ACT inhaler Inhale 1 puff into the lungs 2 (two) times daily. 1 Inhaler 2  . cyanocobalamin (,VITAMIN B-12,) 1000 MCG/ML injection Inject 1 mL (1,000 mcg total) into the muscle every 30 (thirty) days. Pharmacy-please provide 3 cc syringes and 25 gauge 1 inch needles 4 mL 0  . fluticasone (FLOVENT HFA) 44 MCG/ACT inhaler Inhale 1 puff into the lungs 2 (two) times daily. 1 Inhaler 5  . lansoprazole (PREVACID) 30 MG capsule Take 1 capsule (30 mg total) by mouth daily at 12 noon. (Patient taking differently: Take 30 mg by mouth 2 (two) times daily before a meal. ) 90 capsule 3  . lisinopril (PRINIVIL,ZESTRIL) 20 MG tablet TAKE 1 TABLET (20 MG TOTAL) BY MOUTH DAILY. 90 tablet 3  . metoprolol tartrate (LOPRESSOR) 25 MG tablet Take 1 tablet (25 mg total) by mouth 2 (two) times daily. 180 tablet 3  . Na Sulfate-K Sulfate-Mg Sulf 17.5-3.13-1.6 GM/180ML SOLN Suprep-Use as directed 354 mL 0  . tadalafil (CIALIS) 20 MG tablet Take  0.5-1 tablets (10-20 mg total) by mouth every other day as needed for erectile dysfunction. 5 tablet 11   Allergies-reviewed and updated No Known Allergies  Social History   Social History  . Marital status: Married    Spouse name: N/A  . Number of children: 2  . Years of education: N/A   Occupational History  . professor [employer]   Social History Main Topics  . Smoking status: Never Smoker  . Smokeless tobacco: Never Used  . Alcohol use 1.8 - 2.4 oz/week    3 - 4 Cans of beer per week     Comment: social  . Drug use: No  . Sexual activity: Yes    Birth control/ protection: None   Other Topics Concern  . None   Social History Narrative   Married 20 years in 2017. 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    Catering manager, Counselling psychologist-       Hobbies: family time,[place] golfing, basketball with son, walking at park       Objective: BP 116/68 (BP Location: Left Arm, Patient Position: Sitting, Cuff Size: Large)   Pulse 69   Temp 98.1 F (36.7 C) (Oral)   Ht 5\' 8"  (1.727 m)   Wt 195 lb 12.8 oz (88.8 kg)   SpO2 97%   BMI 29.77 kg/m  Gen: NAD, resting comfortably HEENT: Mucous membranes are moist. Oropharynx normal Neck: no thyromegaly CV: RRR no murmurs rubs or gallops Lungs: CTAB no crackles, wheeze, rhonchi Abdomen: soft/nontender/nondistended/normal bowel sounds. No rebound or guarding. overweight Ext: no edema Skin: warm, dry Neuro: grossly normal, moves all extremities, PERRLA Recent rectal exam with urology  Assessment/Plan:  46 y.o. male presenting for annual physical.  Health Maintenance counseling: 1. Anticipatory guidance: Patient counseled regarding regular dental exams q6 months, eye exams yearly, wearing seatbelts.  2. Risk factor reduction:  Advised patient of need for regular exercise and diet rich and fruits and vegetables to reduce risk of heart attack and stroke. Exercise- 3-4x a week. Diet-increased water- workign on more balance in diet.  Wt Readings from Last 3 Encounters:  12/19/16 195 lb 12.8 oz (88.8 kg)  12/14/16 198 lb 9.6 oz (90.1 kg)  11/01/16 196 lb 12.8 oz (89.3 kg)  3. Immunizations/screenings/ancillary studies- advised Tdap- he declines. Discussed pneumovax which he declines as well 4. Prostate cancer screening- referred to urology for elevated PSA. He actually saw [facility] who repeated PSA and showed back under 0.6 and reassuring rectal exam after fissure was doing better.  Lab Results  Component Value Date   PSA 2.54 11/01/2016   PSA 0.62 09/11/2013   5. Colon cancer screening - will be getting screening through GI.   Status of chronic  or acute concerns   Diabetes mellitus without complication ([place]) S: well controlled. On no medication. We have discussed weight loss- weight down 1 lb from last visit in April here.  Lab Results  Component Value Date   HGBA1C 6.7 (H) 04/12/2016   HGBA1C 6.4 01/06/2016   HGBA1C 6.8 08/25/2015   A/P: advised need for  A1c. Get eye records. On ace-I already for renal protection  Essential hypertension S: controlled on Lisinopril 20mg , metoprolol 25mg  BID. No orthostatic symptoms ASCVD 10 year risk calculation if age 33-79: already on statin BP Readings from Last 3 Encounters:  12/19/16 116/68  12/14/16 108/72  11/01/16 124/82  A/P: We discussed blood pressure goal of <140/90. Continue current meds. Discussed option reducing meds but since tolerating so well will continue. Option would be lisinopril 10mg    Hyperlipidemia Update lipids today. Hopeful LDL at least under 100 on atorvastatin 20mg . Work on weight loss to see if can get closer to 70  Pernicious anemia Monthly injections. Orals did not replete stores. Update b12 today  4-6 month follow up  Orders Placed This Encounter  Procedures  . CBC    [place]  . Comprehensive metabolic panel    [place]    Order Specific Question:   Has the patient fasted?    Answer:   No  . Lipid panel    [place]    Order Specific Question:   Has the patient fasted?    Answer:   No  . Hemoglobin A1c    [place]  . Vitamin B12   Return precautions advised.  Garret Reddish, MD

## 2016-12-19 NOTE — Assessment & Plan Note (Signed)
Monthly injections. Orals did not replete stores. Update b12 today

## 2016-12-19 NOTE — Assessment & Plan Note (Signed)
Update lipids today. Hopeful LDL at least under 100 on atorvastatin 20mg . Work on weight loss to see if can get closer to 70

## 2016-12-19 NOTE — Patient Instructions (Addendum)
Declines Tdap- will call if significant cut or scrape.   Sign release of information at the check out desk for eye doctor. Make sure with next visit to tell them you are diabetic and to send [place] a copy.   Please stop by lab before you go  Love the idea of getting down to 180 at least- focus on this over next year. Would be great to lose 10 lbs by 4-6 month follow up  No changes in meds today

## 2016-12-19 NOTE — Assessment & Plan Note (Signed)
S: controlled on Lisinopril 20mg , metoprolol 25mg  BID. No orthostatic symptoms ASCVD 10 year risk calculation if age 45-79: already on statin BP Readings from Last 3 Encounters:  12/19/16 116/68  12/14/16 108/72  11/01/16 124/82  A/P: We discussed blood pressure goal of <140/90. Continue current meds. Discussed option reducing meds but since tolerating so well will continue. Option would be lisinopril 10mg 

## 2016-12-20 ENCOUNTER — Encounter: Payer: Self-pay | Admitting: Family Medicine

## 2016-12-21 ENCOUNTER — Other Ambulatory Visit: Payer: Self-pay

## 2016-12-21 MED ORDER — METFORMIN HCL 500 MG PO TABS: 500.0000 mg | ORAL_TABLET | Freq: Every day | ORAL | 1 refills | Status: DC

## 2016-12-30 ENCOUNTER — Other Ambulatory Visit: Payer: Self-pay | Admitting: Cardiovascular Disease

## 2017-01-01 NOTE — Telephone Encounter (Signed)
Please review for refill, Thanks !  

## 2017-01-31 ENCOUNTER — Encounter: Payer: Self-pay | Admitting: Family Medicine

## 2017-01-31 ENCOUNTER — Encounter: Payer: BC Managed Care – PPO | Admitting: Internal Medicine

## 2017-01-31 ENCOUNTER — Ambulatory Visit (INDEPENDENT_AMBULATORY_CARE_PROVIDER_SITE_OTHER): Payer: BC Managed Care – PPO | Admitting: Family Medicine

## 2017-01-31 VITALS — BP 127/78 | HR 80 | Temp 98.3°F | Ht 68.0 in | Wt 190.0 lb

## 2017-01-31 DIAGNOSIS — J069 Acute upper respiratory infection, unspecified: Secondary | ICD-10-CM

## 2017-01-31 NOTE — Progress Notes (Signed)
   Subjective:    Patient ID: Deldrick Linch, male    DOB: 1971-02-10, 46 y.o.   MRN: [id number]  HPI Here for 3 days of mild ST, PND, and a dry cough. No fever. Drinking fluids.    Review of Systems  Constitutional: Negative.   HENT: Positive for postnasal drip and sore throat. Negative for congestion, rhinorrhea, sinus pain and sinus pressure.   Eyes: Negative.   Respiratory: Positive for cough.        Objective:   Physical Exam  Constitutional: He is oriented to person, place, and time. He appears well-developed and well-nourished.  HENT:  Right Ear: External ear normal.  Left Ear: External ear normal.  Nose: Nose normal.  Mouth/Throat: Oropharynx is clear and moist.  Eyes: Conjunctivae are normal.  Neck: No thyromegaly present.  Pulmonary/Chest: Effort normal and breath sounds normal. No respiratory distress. He has no wheezes. He has no rales.  Lymphadenopathy:    He has no cervical adenopathy.  Neurological: He is alert and oriented to person, place, and time.          Assessment & Plan:  Viral URI. Use throat lozenges and Advil prn.  Alysia Penna, MD

## 2017-01-31 NOTE — Patient Instructions (Signed)
WE NOW OFFER   [facility] FAST TRACK!!!  SAME DAY Appointments for ACUTE CARE  Such as: Sprains, Injuries, cuts, abrasions, rashes, muscle pain, joint pain, back pain Colds, flu, sore throats, headache, allergies, cough, fever  Ear pain, sinus and eye infections Abdominal pain, nausea, vomiting, diarrhea, upset stomach Animal/insect bites  3 Easy Ways to Schedule: Walk-In Scheduling Call in scheduling Mychart Sign-up: [email or web address]         

## 2017-02-13 ENCOUNTER — Other Ambulatory Visit: Payer: Self-pay | Admitting: Family Medicine

## 2017-02-18 ENCOUNTER — Other Ambulatory Visit: Payer: Self-pay | Admitting: Internal Medicine

## 2017-02-26 ENCOUNTER — Ambulatory Visit (INDEPENDENT_AMBULATORY_CARE_PROVIDER_SITE_OTHER): Payer: BC Managed Care – PPO | Admitting: Family Medicine

## 2017-02-26 ENCOUNTER — Encounter: Payer: Self-pay | Admitting: Family Medicine

## 2017-02-26 VITALS — BP 124/72 | HR 73 | Temp 98.2°F | Ht 68.0 in | Wt 186.2 lb

## 2017-02-26 DIAGNOSIS — H6122 Impacted cerumen, left ear: Secondary | ICD-10-CM | POA: Diagnosis not present

## 2017-02-26 NOTE — Progress Notes (Signed)
   Subjective:    Patient ID: Gregory Collins, male    DOB: 09/13/1970, 46 y.o.   MRN: [id number]  HPI Here for the sudden onset of mild pain and decreased hearing in the left ear 3 days ago. No URI symptoms. No recent swimming.    Review of Systems  Constitutional: Negative.   HENT: Positive for ear pain and hearing loss. Negative for congestion, postnasal drip, sinus pain, sinus pressure and sore throat.   Eyes: Negative.   Respiratory: Negative.   Cardiovascular: Negative.        Objective:   Physical Exam  Constitutional: He appears well-developed and well-nourished.  HENT:  Head: Normocephalic and atraumatic.  Right Ear: External ear normal.  Nose: Nose normal.  Mouth/Throat: Oropharynx is clear and moist.  Left ear canal is blocked with cerumen   Eyes: Conjunctivae are normal.  Neck: Neck supple. No thyromegaly present.  Pulmonary/Chest: Effort normal and breath sounds normal. No respiratory distress. He has no wheezes. He has no rales.  Lymphadenopathy:    He has no cervical adenopathy.          Assessment & Plan:  Cerumen impaction. This was irrigated clear with water. On reexam the canal and TM are clear.  Alysia Penna, MD

## 2017-03-07 ENCOUNTER — Encounter: Payer: Self-pay | Admitting: Internal Medicine

## 2017-03-21 ENCOUNTER — Encounter: Payer: BC Managed Care – PPO | Admitting: Internal Medicine

## 2017-04-05 ENCOUNTER — Other Ambulatory Visit: Payer: Self-pay | Admitting: Cardiovascular Disease

## 2017-04-05 NOTE — Telephone Encounter (Signed)
Please review for refill, thanks ! 

## 2017-04-26 ENCOUNTER — Other Ambulatory Visit (INDEPENDENT_AMBULATORY_CARE_PROVIDER_SITE_OTHER): Payer: BC Managed Care – PPO

## 2017-04-26 DIAGNOSIS — E538 Deficiency of other specified B group vitamins: Secondary | ICD-10-CM | POA: Diagnosis not present

## 2017-04-26 LAB — VITAMIN B12: Vitamin B-12: 212 pg/mL (ref 211–911)

## 2017-04-30 ENCOUNTER — Other Ambulatory Visit: Payer: Self-pay

## 2017-04-30 DIAGNOSIS — E538 Deficiency of other specified B group vitamins: Secondary | ICD-10-CM

## 2017-04-30 MED ORDER — CYANOCOBALAMIN 1000 MCG/ML IJ SOLN: 1000.0000 ug | INTRAMUSCULAR | 0 refills | Status: DC

## 2017-05-08 ENCOUNTER — Ambulatory Visit (AMBULATORY_SURGERY_CENTER): Payer: Self-pay | Admitting: *Deleted

## 2017-05-08 ENCOUNTER — Encounter: Payer: Self-pay | Admitting: Family Medicine

## 2017-05-08 ENCOUNTER — Ambulatory Visit (INDEPENDENT_AMBULATORY_CARE_PROVIDER_SITE_OTHER): Payer: BC Managed Care – PPO | Admitting: Family Medicine

## 2017-05-08 VITALS — Ht 67.0 in | Wt 189.8 lb

## 2017-05-08 VITALS — BP 114/72 | HR 77 | Temp 98.2°F | Ht 67.0 in | Wt 189.4 lb

## 2017-05-08 DIAGNOSIS — M25562 Pain in left knee: Secondary | ICD-10-CM | POA: Diagnosis not present

## 2017-05-08 DIAGNOSIS — Z1211 Encounter for screening for malignant neoplasm of colon: Secondary | ICD-10-CM

## 2017-05-08 MED ORDER — MELOXICAM 15 MG PO TABS: 15.0000 mg | ORAL_TABLET | Freq: Every day | ORAL | 0 refills | Status: DC

## 2017-05-08 NOTE — Progress Notes (Signed)
No egg or soy allergy known to patient  No issues with past sedation with any surgeries  or procedures, no intubation problems  No diet pills per patient No home 02 use per patient  No blood thinners per patient  Pt denies issues with constipation  No A fib or A flutter  EMMI video sent to pt's e mail . Pt Declined  

## 2017-05-08 NOTE — Progress Notes (Signed)
Subjective:  Gregory Collins is a 46 y.o. year old very pleasant male patient who presents for/with See problem oriented charting ROS- no fever or chills. No erythema around knee. No clear swelling.    Past Medical History-  Patient Active Problem List   Diagnosis Date Noted  . Diabetes mellitus without complication ([place]) 22/97/9892    Priority: High  . Mild persistent asthma 08/03/2015    Priority: Medium  . Sinus tachycardia 06/29/2015    Priority: Medium  . Pernicious anemia 03/29/2015    Priority: Medium  . Hyperlipidemia 11/10/2014    Priority: Medium  . Essential hypertension 11/10/2014    Priority: Medium  . GERD (gastroesophageal reflux disease) 06/20/2011    Priority: Medium  . Erectile dysfunction 07/28/2016    Priority: Low  . Allergic rhinitis 11/16/2015    Priority: Low  . Migraine 08/20/2011    Priority: Low    Medications- reviewed and updated Current Outpatient Prescriptions  Medication Sig Dispense Refill  . albuterol (PROAIR HFA) 108 (90 Base) MCG/ACT inhaler Inhale 2 puffs into the lungs every 4 (four) hours as needed for wheezing or shortness of breath. 8.5 Inhaler 1  . aspirin EC 81 MG tablet Take by mouth.    Marland Kitchen atorvastatin (LIPITOR) 20 MG tablet TAKE 1 TABLET (20 MG TOTAL) BY MOUTH DAILY. 90 tablet 1  . beclomethasone (QVAR) 40 MCG/ACT inhaler Inhale 1 puff into the lungs 2 (two) times daily. 1 Inhaler 2  . cyanocobalamin (,VITAMIN B-12,) 1000 MCG/ML injection Inject 1 mL (1,000 mcg total) into the muscle every 14 (fourteen) days. Pharmacy-please provide 3 cc syringes and 25 gauge 1 inch needles 4 mL 0  . fluticasone (FLOVENT HFA) 44 MCG/ACT inhaler Inhale 1 puff into the lungs 2 (two) times daily. 1 Inhaler 5  . lansoprazole (PREVACID) 30 MG capsule Take 1 capsule (30 mg total) by mouth daily at 12 noon. (Patient taking differently: Take 30 mg by mouth 2 (two) times daily before a meal. ) 90 capsule 3  . lisinopril (PRINIVIL,ZESTRIL) 20 MG tablet  TAKE 1 TABLET (20 MG TOTAL) BY MOUTH DAILY. 90 tablet 3  . metFORMIN (GLUCOPHAGE) 500 MG tablet Take 1 tablet (500 mg total) by mouth daily with breakfast. 90 tablet 1  . metoprolol tartrate (LOPRESSOR) 25 MG tablet TAKE 1 TABLET TWICE A DAY 180 tablet 0  . Na Sulfate-K Sulfate-Mg Sulf 17.5-3.13-1.6 GM/180ML SOLN Suprep-Use as directed 354 mL 0  . tadalafil (CIALIS) 20 MG tablet Take 0.5-1 tablets (10-20 mg total) by mouth every other day as needed for erectile dysfunction. 5 tablet 11   No current facility-administered medications for this visit.      Objective: BP 114/72 (BP Location: Left Arm, Patient Position: Sitting, Cuff Size: Large)   Pulse 77   Temp 98.2 F (36.8 C) (Oral)   Ht 5\' 7"  (1.702 m)   Wt 189 lb 6.4 oz (85.9 kg)   SpO2 96%   BMI 29.66 kg/m  Gen: NAD, resting comfortably CV: RRR  Lungs: nonlabored Ext: no edema Left Knee: Normal to inspection with no erythema or effusion (other than mild edema around patellar tendon) or obvious bony abnormalities. Palpation shows mild joint line tenderness lateral and medial. Patient is most tender along patellar tendon. no condyle tenderness. ROM normal in flexion and extension and lower leg rotation. Ligaments with solid consistent endpoints including ACL, PCL, LCL, MCL. Negative Mcmurray's and provocative meniscal tests. Slight pain with patellar compression Walks with limp despite no current pain- states  wants to be cautious as pain can come out of no where.  quadriceps tendons unremarkable. Hamstring and quadriceps strength is normal.   Assessment/Plan:  Acute pain of left knee S:  Started with pain in left knee 2 weeks ago. No clear trigger. has been active playing with 2 kids 34 and 1 years old. When it started did play a round of golf afterwards- 18 holes riding and thinks may have worsened it. no pain at rest. As soon as he gets up and puts pressure on it will bother him intermittently. Diffuse anterior pain. Pain up  to 7/10 at worst. Hears some popping. Pain can get intense and make him feel like it will buckle but has not buckled. Denies fall or injury. Has not tried any medication .Also before this had been in gym regularly including treadmill. Denies history knee issues A/P: suspect patellar tendonitis though no clear trigger. Mobic 10-14 days. Ice. Follow up for DM in 2 weeks and can recheck this. Also see avs.   He asks about x-ray - discussed would consider this as long as SM referral if this doesn't improve. Could have some mild OA as well.   Patient Instructions  Based on lacation of pain- suspect tendonitis of patellar tendon (just below the knee)  Sent in 30 days of antiinflammatory but I want you to start with 10-14 days  Could use an infrapatellar strap  Ice 20 minutes 3x a day for 3 days then can use heat up to 3x a day for 20 minutes  Lets check back in 2-4 weeks for diabetes check up- consider sports medicine referral at that point if not doing better  Future Appointments Date Time Provider [place]  05/22/2017 9:00 AM Pyrtle, Lajuan Lines, MD [facility]    Meds ordered this encounter  Medications  . meloxicam (MOBIC) 15 MG tablet    Sig: Take 1 tablet (15 mg total) by mouth daily.    Dispense:  30 tablet    Refill:  0    Return precautions advised.  Garret Reddish, MD

## 2017-05-08 NOTE — Patient Instructions (Signed)
Based on lacation of pain- suspect tendonitis of patellar tendon (just below the knee)  Sent in 30 days of antiinflammatory but I want you to start with 10-14 days  Could use an infrapatellar strap  Ice 20 minutes 3x a day for 3 days then can use heat up to 3x a day for 20 minutes  Lets check back in 2-4 weeks for diabetes check up- consider sports medicine referral at that point if not doing better

## 2017-05-14 ENCOUNTER — Other Ambulatory Visit: Payer: Self-pay

## 2017-05-14 MED ORDER — ATORVASTATIN CALCIUM 20 MG PO TABS: 20.0000 mg | ORAL_TABLET | Freq: Every day | ORAL | 1 refills | Status: DC

## 2017-05-22 ENCOUNTER — Ambulatory Visit (AMBULATORY_SURGERY_CENTER): Payer: BC Managed Care – PPO | Admitting: Internal Medicine

## 2017-05-22 ENCOUNTER — Encounter: Payer: Self-pay | Admitting: Internal Medicine

## 2017-05-22 VITALS — BP 119/87 | HR 89 | Temp 98.0°F | Resp 14 | Ht 67.0 in | Wt 189.0 lb

## 2017-05-22 DIAGNOSIS — D123 Benign neoplasm of transverse colon: Secondary | ICD-10-CM | POA: Diagnosis not present

## 2017-05-22 DIAGNOSIS — Z1211 Encounter for screening for malignant neoplasm of colon: Secondary | ICD-10-CM | POA: Diagnosis present

## 2017-05-22 DIAGNOSIS — Z1212 Encounter for screening for malignant neoplasm of rectum: Secondary | ICD-10-CM | POA: Diagnosis not present

## 2017-05-22 DIAGNOSIS — D124 Benign neoplasm of descending colon: Secondary | ICD-10-CM | POA: Diagnosis not present

## 2017-05-22 MED ORDER — SODIUM CHLORIDE 0.9 % IV SOLN: 500.0000 mL | INTRAVENOUS | Status: DC

## 2017-05-22 NOTE — Progress Notes (Signed)
Called to room to assist during endoscopic procedure.  Patient ID and intended procedure confirmed with present staff. Received instructions for my participation in the procedure from the performing physician.  

## 2017-05-22 NOTE — Progress Notes (Signed)
Pt's states no medical or surgical changes since previsit or office visit. maw 

## 2017-05-22 NOTE — Patient Instructions (Signed)
   INFORMATION ON POLYPS GIVEN TO YOU TODAY    AWAIT PATHOLOGY RESULTS ON POLYPS REMOVED TODAY    YOU HAD AN ENDOSCOPIC PROCEDURE TODAY AT [facility]:   Refer to the procedure report that was given to you for any specific questions about what was found during the examination.  If the procedure report does not answer your questions, please call your gastroenterologist to clarify.  If you requested that your care partner not be given the details of your procedure findings, then the procedure report has been included in a sealed envelope for you to review at your convenience later.  YOU SHOULD EXPECT: Some feelings of bloating in the abdomen. Passage of more gas than usual.  Walking can help get rid of the air that was put into your GI tract during the procedure and reduce the bloating. If you had a lower endoscopy (such as a colonoscopy or flexible sigmoidoscopy) you may notice spotting of blood in your stool or on the toilet paper. If you underwent a bowel prep for your procedure, you may not have a normal bowel movement for a few days.  Please Note:  You might notice some irritation and congestion in your nose or some drainage.  This is from the oxygen used during your procedure.  There is no need for concern and it should clear up in a day or so.  SYMPTOMS TO REPORT IMMEDIATELY:   Following lower endoscopy (colonoscopy or flexible sigmoidoscopy):  Excessive amounts of blood in the stool  Significant tenderness or worsening of abdominal pains  Swelling of the abdomen that is new, acute  Fever of 100F or higher    For urgent or emergent issues, a gastroenterologist can be reached at any hour by calling [phone].   DIET:  We do recommend a small meal at first, but then you may proceed to your regular diet.  Drink plenty of fluids but you should avoid alcoholic beverages for 24 hours.  ACTIVITY:  You should plan to take it easy for the rest of today and you should  NOT DRIVE or use heavy machinery until tomorrow (because of the sedation medicines used during the test).    FOLLOW UP: Our staff will call the number listed on your records the next business day following your procedure to check on you and address any questions or concerns that you may have regarding the information given to you following your procedure. If we do not reach you, we will leave a message.  However, if you are feeling well and you are not experiencing any problems, there is no need to return our call.  We will assume that you have returned to your regular daily activities without incident.  If any biopsies were taken you will be contacted by phone or by letter within the next 1-3 weeks.  Please call us at [phone] if you have not heard about the biopsies in 3 weeks.    SIGNATURES/CONFIDENTIALITY: You and/or your care partner have signed paperwork which will be entered into your electronic medical record.  These signatures attest to the fact that that the information above on your After Visit Summary has been reviewed and is understood.  Full responsibility of the confidentiality of this discharge information lies with you and/or your care-partner.

## 2017-05-22 NOTE — Progress Notes (Signed)
Report given to PACU, vss 

## 2017-05-22 NOTE — Op Note (Signed)
[facility] Patient Name: Gregory Collins Procedure Date: 05/22/2017 9:05 AM MRN: [id number] Endoscopist: Jerene Bears , MD Age: 46 Referring MD:  Date of Birth: 05-04-1971 Gender: Male Account #: [id number] Procedure:                Colonoscopy Indications:              Screening for colorectal malignant neoplasm, This                            is the patient's first colonoscopy Medicines:                Monitored Anesthesia Care Procedure:                Pre-Anesthesia Assessment:                           - Prior to the procedure, a History and Physical                            was performed, and patient medications and                            allergies were reviewed. The patient's tolerance of                            previous anesthesia was also reviewed. The risks                            and benefits of the procedure and the sedation                            options and risks were discussed with the patient.                            All questions were answered, and informed consent                            was obtained. Prior Anticoagulants: The patient has                            taken no previous anticoagulant or antiplatelet                            agents. ASA Grade Assessment: II - A patient with                            mild systemic disease. After reviewing the risks                            and benefits, the patient was deemed in                            satisfactory condition to undergo the procedure.  After obtaining informed consent, the colonoscope                            was passed under direct vision. Throughout the                            procedure, the patient's blood pressure, pulse, and                            oxygen saturations were monitored continuously. The                            Colonoscope was introduced through the anus and                            advanced to the the cecum,  identified by                            appendiceal orifice and ileocecal valve. The                            colonoscopy was performed without difficulty. The                            patient tolerated the procedure well. The quality                            of the bowel preparation was good. The ileocecal                            valve, appendiceal orifice, and rectum were                            photographed. Scope In: 9:14:52 AM Scope Out: 9:25:37 AM Scope Withdrawal Time: 0 hours 9 minutes 32 seconds  Total Procedure Duration: 0 hours 10 minutes 45 seconds  Findings:                 The digital rectal exam was normal.                           Two sessile polyps were found in the descending                            colon and distal transverse colon. The polyps were                            3 to 4 mm in size. These polyps were removed with a                            cold snare. Resection and retrieval were complete.                           The exam was otherwise without abnormality on  direct and retroflexion views. Complications:            No immediate complications. Estimated Blood Loss:     Estimated blood loss was minimal. Impression:               - Two 3 to 4 mm polyps in the descending colon and                            in the distal transverse colon, removed with a cold                            snare. Resected and retrieved.                           - The examination was otherwise normal on direct                            and retroflexion views. Recommendation:           - Patient has a contact number available for                            emergencies. The signs and symptoms of potential                            delayed complications were discussed with the                            patient. Return to normal activities tomorrow.                            Written discharge instructions were provided to the                             patient.                           - Resume previous diet.                           - Continue present medications.                           - Await pathology results.                           - Repeat colonoscopy is recommended. The                            colonoscopy date will be determined after pathology                            results from today's exam become available for                            review. Jerene Bears, MD 05/22/2017 9:32:52 AM This report has been  signed electronically.

## 2017-05-23 ENCOUNTER — Telehealth: Payer: Self-pay

## 2017-05-23 NOTE — Telephone Encounter (Signed)
  Follow up Call-  Call back number 05/22/2017 06/14/2016  Post procedure Call Back phone  # [phone]  Permission to leave phone message Yes Yes  Some recent data might be hidden     Patient questions:  Do you have a fever, pain , or abdominal swelling? No. Pain Score  0 *  Have you tolerated food without any problems? Yes.    Have you been able to return to your normal activities? Yes.    Do you have any questions about your discharge instructions: Diet   No. Medications  No. Follow up visit  No.  Do you have questions or concerns about your Care? No.  Actions: * If pain score is 4 or above: No action needed, pain <4.

## 2017-05-23 NOTE — Telephone Encounter (Signed)
Attempted to reach patient for post-procedure f/u call. No answer. Left message that we will attempt to reach him again later today and for him to please not hesitate to call us if he has any questions/concerns regarding his care. 

## 2017-05-26 ENCOUNTER — Other Ambulatory Visit: Payer: Self-pay | Admitting: Internal Medicine

## 2017-05-29 ENCOUNTER — Encounter: Payer: Self-pay | Admitting: Internal Medicine

## 2017-06-05 ENCOUNTER — Encounter: Payer: Self-pay | Admitting: Family Medicine

## 2017-06-05 ENCOUNTER — Ambulatory Visit: Payer: BC Managed Care – PPO | Admitting: Family Medicine

## 2017-06-05 VITALS — BP 106/70 | HR 68 | Temp 98.1°F | Ht 67.0 in | Wt 191.0 lb

## 2017-06-05 DIAGNOSIS — E119 Type 2 diabetes mellitus without complications: Secondary | ICD-10-CM | POA: Diagnosis not present

## 2017-06-05 DIAGNOSIS — I10 Essential (primary) hypertension: Secondary | ICD-10-CM

## 2017-06-05 LAB — POCT GLYCOSYLATED HEMOGLOBIN (HGB A1C): Hemoglobin A1C: 6.1

## 2017-06-05 IMAGING — CR DG CHEST 2V
2 series · 2 of 2 positions shown · non-contrast
Comparison: Chest x-ray of 08/25/2015

CLINICAL DATA: History of abnormal chest x-ray, followup

EXAM:
CHEST  2 VIEW

[lateral]
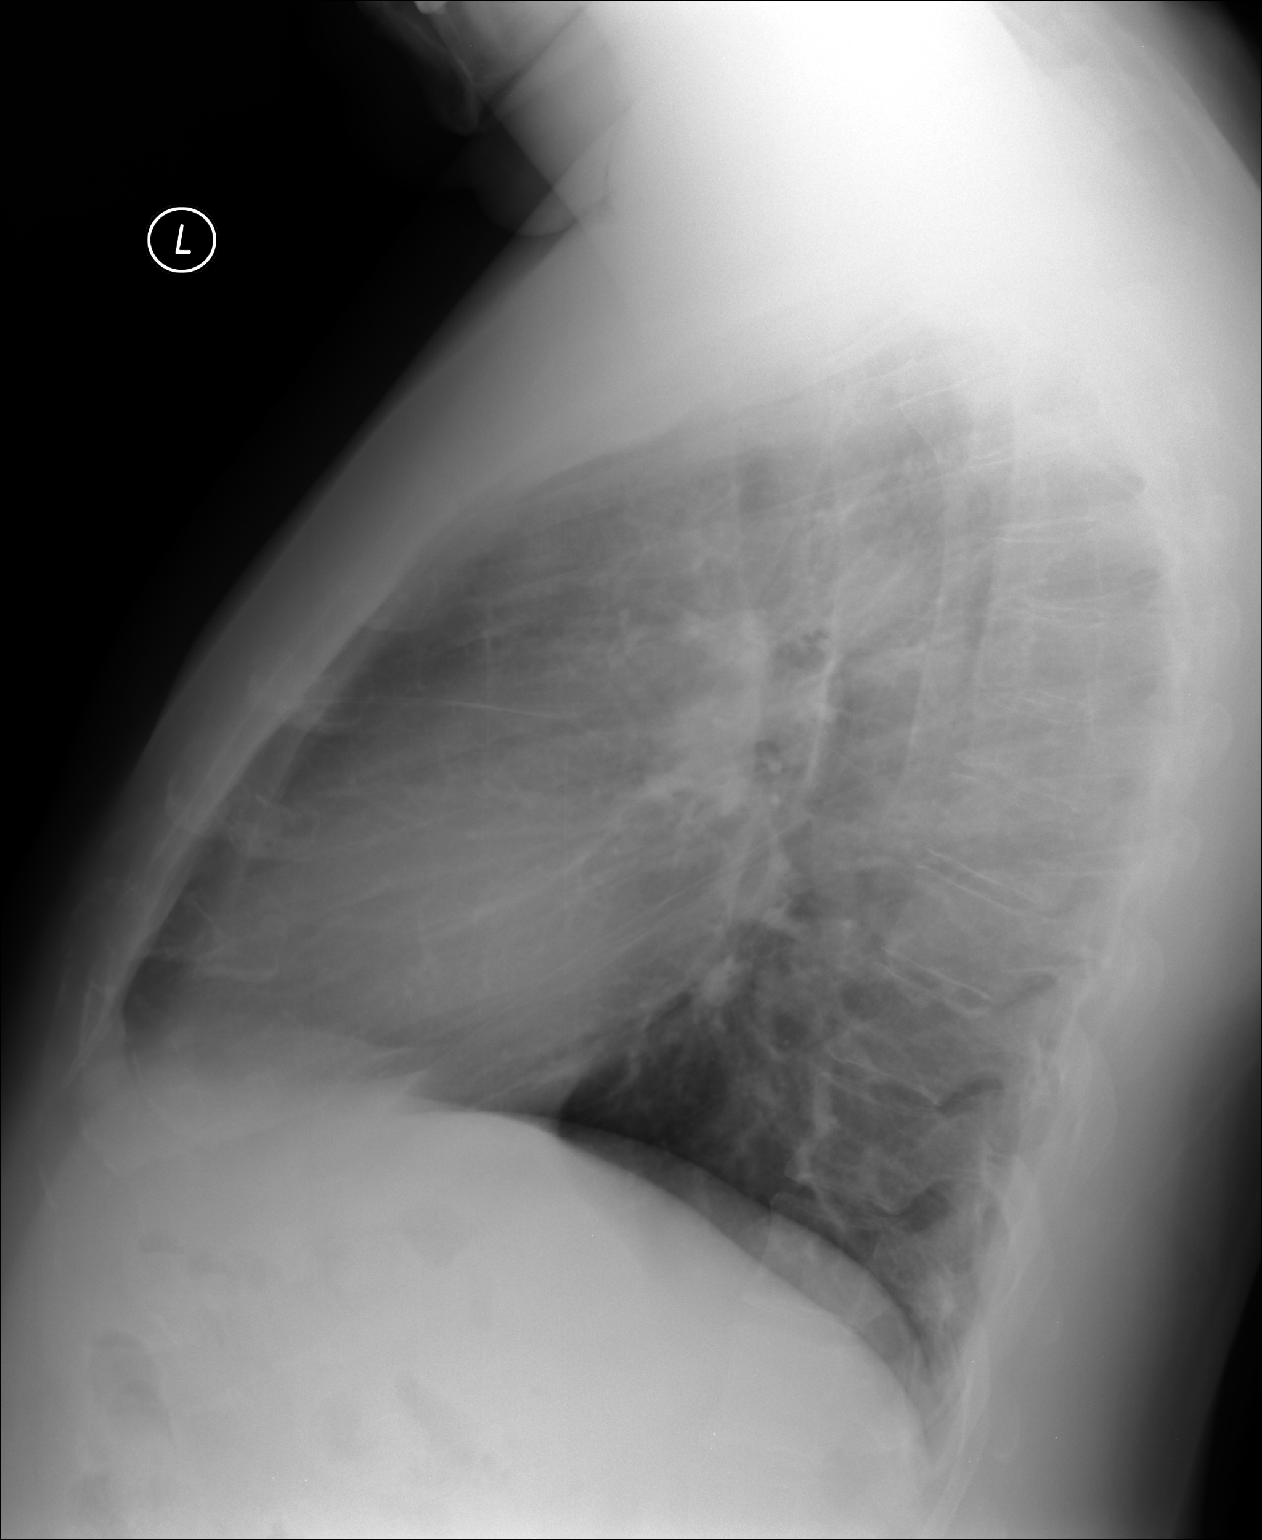

[PA]
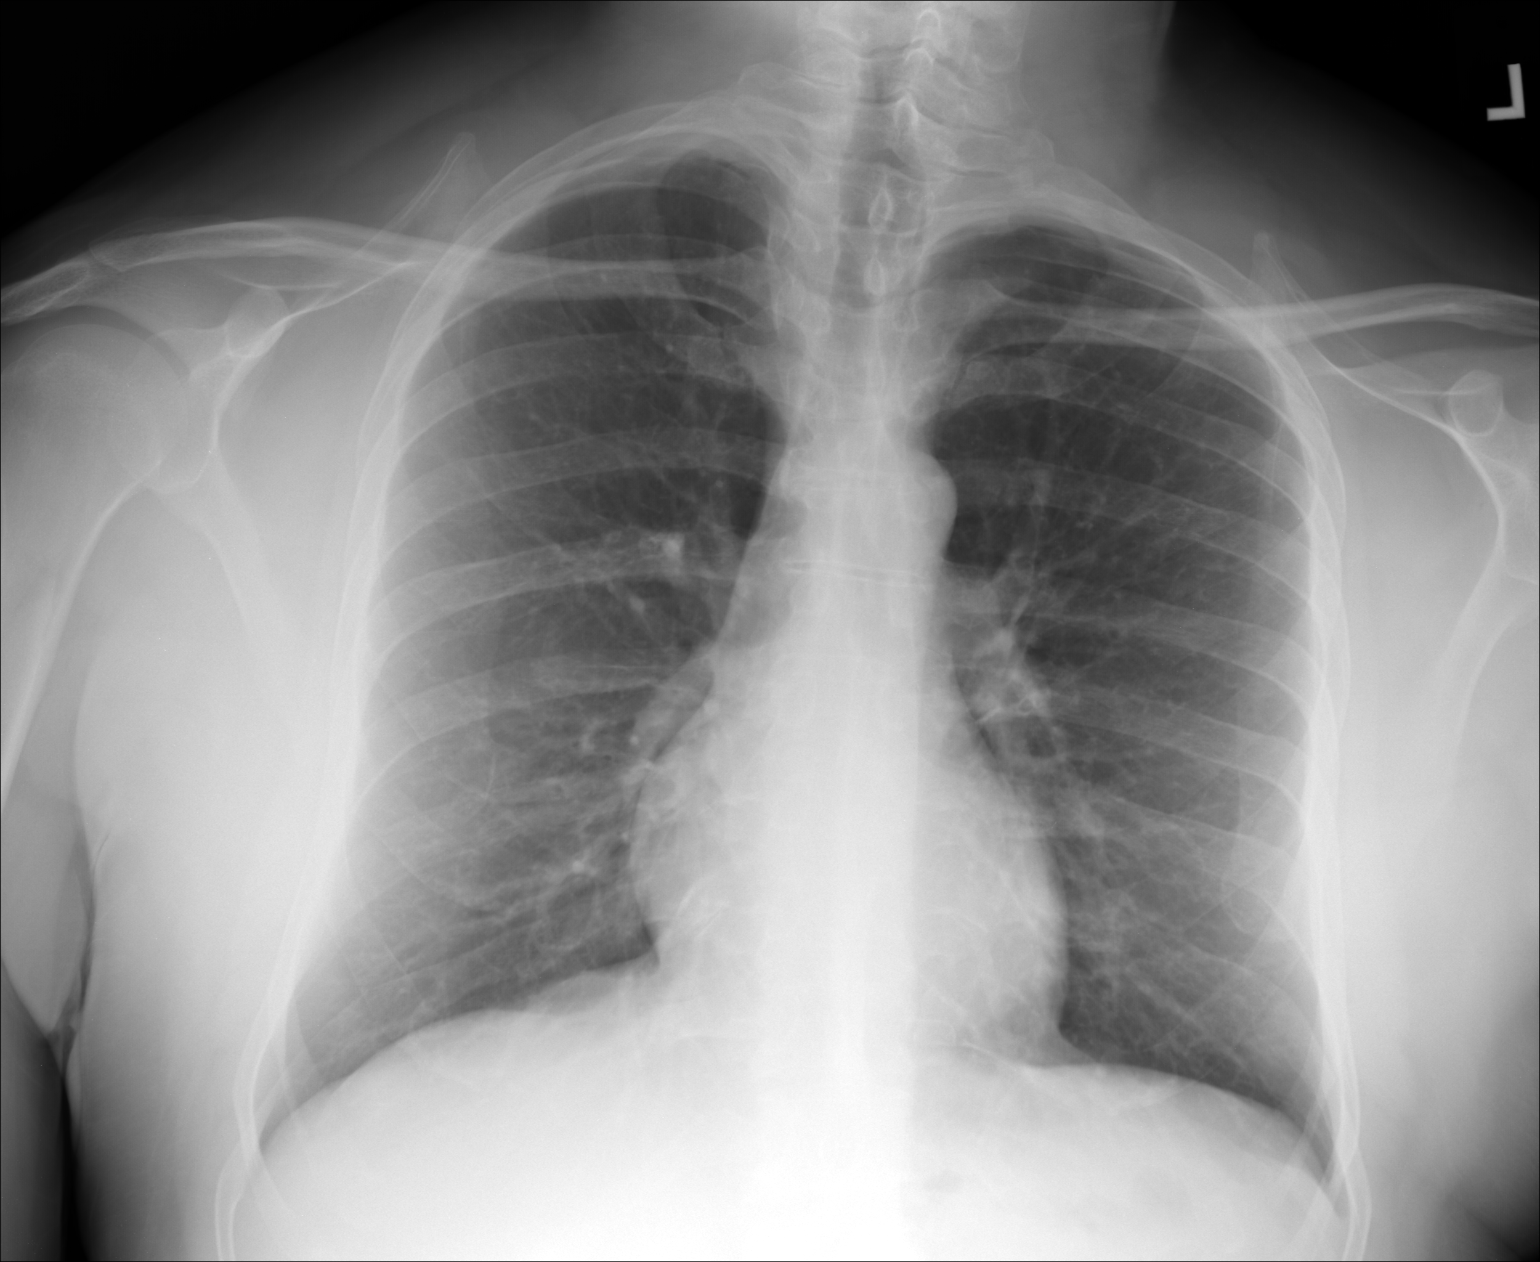

[2 of 2 positions shown; findings below may reference images not displayed]

FINDINGS: No active infiltrate or effusion is seen. The area questioned on
prior chest x-ray appears to have represented overlapping
vasculature. Mediastinal and hilar contours are unremarkable. The
heart is within normal limits in size. No bony abnormality is seen.
IMPRESSION: No active cardiopulmonary disease.

## 2017-06-05 NOTE — Progress Notes (Signed)
Subjective:  Gregory Collins is a 46 y.o. year old very pleasant male patient who presents for/with See problem oriented charting ROS- No chest pain or shortness of breath. No headache or blurry vision.  No hypoglycemia   Past Medical History-  Patient Active Problem List   Diagnosis Date Noted  . Diabetes mellitus without complication ([place]) 96/22/2979    Priority: High  . Mild persistent asthma 08/03/2015    Priority: Medium  . Sinus tachycardia 06/29/2015    Priority: Medium  . Pernicious anemia 03/29/2015    Priority: Medium  . Hyperlipidemia 11/10/2014    Priority: Medium  . Essential hypertension 11/10/2014    Priority: Medium  . GERD (gastroesophageal reflux disease) 06/20/2011    Priority: Medium  . Erectile dysfunction 07/28/2016    Priority: Low  . Allergic rhinitis 11/16/2015    Priority: Low  . Migraine 08/20/2011    Priority: Low    Medications- reviewed and updated Current Outpatient Medications  Medication Sig Dispense Refill  . albuterol (PROAIR HFA) 108 (90 Base) MCG/ACT inhaler Inhale 2 puffs into the lungs every 4 (four) hours as needed for wheezing or shortness of breath. 8.5 Inhaler 1  . aspirin EC 81 MG tablet Take by mouth.    Marland Kitchen atorvastatin (LIPITOR) 20 MG tablet Take 1 tablet (20 mg total) daily by mouth. 90 tablet 1  . beclomethasone (QVAR) 40 MCG/ACT inhaler Inhale 1 puff into the lungs 2 (two) times daily. 1 Inhaler 2  . cyanocobalamin (,VITAMIN B-12,) 1000 MCG/ML injection INJECT 1ML INTO THE MUSCLE EVERY 14 DAYS 6 mL 0  . fluticasone (FLOVENT HFA) 44 MCG/ACT inhaler Inhale 1 puff into the lungs 2 (two) times daily. 1 Inhaler 5  . lansoprazole (PREVACID) 30 MG capsule Take 1 capsule (30 mg total) by mouth daily at 12 noon. (Patient taking differently: Take 30 mg by mouth 2 (two) times daily before a meal. ) 90 capsule 3  . lisinopril (PRINIVIL,ZESTRIL) 20 MG tablet TAKE 1 TABLET (20 MG TOTAL) BY MOUTH DAILY. 90 tablet 3  . meloxicam (MOBIC) 15  MG tablet Take 1 tablet (15 mg total) by mouth daily. 30 tablet 0  . metFORMIN (GLUCOPHAGE) 500 MG tablet Take 1 tablet (500 mg total) by mouth daily with breakfast. 90 tablet 1  . metoprolol tartrate (LOPRESSOR) 25 MG tablet TAKE 1 TABLET TWICE A DAY 180 tablet 0  . tadalafil (CIALIS) 20 MG tablet Take 0.5-1 tablets (10-20 mg total) by mouth every other day as needed for erectile dysfunction. 5 tablet 11   Objective: BP 106/70 (BP Location: Left Arm, Patient Position: Sitting, Cuff Size: Large)   Pulse 68   Temp 98.1 F (36.7 C) (Oral)   Ht 5\' 7"  (1.702 m)   Wt 191 lb (86.6 kg)   SpO2 96%   BMI 29.91 kg/m  Gen: NAD, resting comfortably CV: RRR no murmurs rubs or gallops Lungs: CTAB no crackles, wheeze, rhonchi Abdomen: soft/nontender/nondistended/normal bowel sounds. No rebound or guarding. overweight Ext: no edema Skin: warm, dry  Assessment/Plan:  Other issues 1. Prior patellar tendon pain resolved with mobic. He ultimately remembered he did have trauma once at night when got out of bed. He is thrilled this has resolved and ready to get back to exercise.  2. We will try to do Tdap and pneumovax at next visit at [facility]- also needs foot exam  Diabetes mellitus without complication ([place]) S: very well controlled. On metformin 500mg  daily.  Has not lost any weight. Not exercising  as was recovering from knee injury Lab Results  Component Value Date   HGBA1C 6.1 06/05/2017   HGBA1C 7.4 (H) 12/19/2016   HGBA1C 6.7 (H) 04/12/2016   A/P: drastic improvement in a1c. He thinks some of this was exercise he was doing prior to knee injury but also knows the metformin 500mg  daily is helping him- plans to continue. Since doing so well- agreed to 6 month follow up. We counseled on importance of healthy diet/regular exercise to help maintain his a1c and potentially come off medication.   Essential hypertension S: controlled on Lisinopril 20mg , metoprolol 25mg  BID.  BP Readings from Last 3  Encounters:  06/05/17 106/70  05/22/17 119/87  05/08/17 114/72  A/P: We discussed blood pressure goal of <140/90. Continue current meds:  But discussed if BP remains under 110 may be able to reduce Bp medications.    Future Appointments  Date Time Provider [place]  12/25/2017  8:15 AM Marin Olp, MD [facility]   Return in about 29 weeks (around 12/25/2017) for physical. come fasting and update full lipid panel.   Orders Placed This Encounter  Procedures  . POCT glycosylated hemoglobin (Hb A1C)   Return precautions advised.  Garret Reddish, MD

## 2017-06-05 NOTE — Patient Instructions (Signed)
Great job getting your a1c back down- continue the metformin  Tell your wife she has permission to make sure you eat your veggies

## 2017-06-05 NOTE — Assessment & Plan Note (Signed)
S: controlled on Lisinopril 20mg , metoprolol 25mg  BID.  BP Readings from Last 3 Encounters:  06/05/17 106/70  05/22/17 119/87  05/08/17 114/72  A/P: We discussed blood pressure goal of <140/90. Continue current meds:  But discussed if BP remains under 110 may be able to reduce Bp medications.

## 2017-06-05 NOTE — Assessment & Plan Note (Signed)
S: very well controlled. On metformin 500mg  daily.  Has not lost any weight. Not exercising as was recovering from knee injury Lab Results  Component Value Date   HGBA1C 6.1 06/05/2017   HGBA1C 7.4 (H) 12/19/2016   HGBA1C 6.7 (H) 04/12/2016   A/P: drastic improvement in a1c. He thinks some of this was exercise he was doing prior to knee injury but also knows the metformin 500mg  daily is helping him- plans to continue. Since doing so well- agreed to 6 month follow up. We counseled on importance of healthy diet/regular exercise to help maintain his a1c and potentially come off medication.

## 2017-06-13 ENCOUNTER — Other Ambulatory Visit: Payer: Self-pay

## 2017-06-13 MED ORDER — METFORMIN HCL 500 MG PO TABS: 500.0000 mg | ORAL_TABLET | Freq: Every day | ORAL | 1 refills | Status: DC

## 2017-06-21 ENCOUNTER — Other Ambulatory Visit: Payer: Self-pay

## 2017-06-21 MED ORDER — MELOXICAM 15 MG PO TABS: 15.0000 mg | ORAL_TABLET | Freq: Every day | ORAL | 0 refills | Status: DC

## 2017-06-22 ENCOUNTER — Encounter: Payer: Self-pay | Admitting: Family Medicine

## 2017-06-28 ENCOUNTER — Other Ambulatory Visit: Payer: Self-pay

## 2017-06-28 MED ORDER — METOPROLOL TARTRATE 25 MG PO TABS: 25.0000 mg | ORAL_TABLET | Freq: Two times a day (BID) | ORAL | 0 refills | Status: DC

## 2017-07-02 ENCOUNTER — Other Ambulatory Visit: Payer: Self-pay | Admitting: *Deleted

## 2017-07-02 MED ORDER — METOPROLOL TARTRATE 25 MG PO TABS: 25.0000 mg | ORAL_TABLET | Freq: Two times a day (BID) | ORAL | 0 refills | Status: DC

## 2017-07-04 ENCOUNTER — Other Ambulatory Visit: Payer: Self-pay

## 2017-07-04 MED ORDER — LISINOPRIL 20 MG PO TABS: 20.0000 mg | ORAL_TABLET | Freq: Every day | ORAL | 3 refills | Status: DC

## 2017-07-16 ENCOUNTER — Other Ambulatory Visit: Payer: Self-pay | Admitting: Allergy and Immunology

## 2017-07-16 DIAGNOSIS — J4530 Mild persistent asthma, uncomplicated: Secondary | ICD-10-CM

## 2017-07-26 ENCOUNTER — Other Ambulatory Visit: Payer: Self-pay

## 2017-08-20 ENCOUNTER — Other Ambulatory Visit (INDEPENDENT_AMBULATORY_CARE_PROVIDER_SITE_OTHER): Payer: BC Managed Care – PPO

## 2017-08-20 ENCOUNTER — Other Ambulatory Visit: Payer: Self-pay

## 2017-08-20 DIAGNOSIS — E538 Deficiency of other specified B group vitamins: Secondary | ICD-10-CM

## 2017-08-20 LAB — VITAMIN B12: Vitamin B-12: 525 pg/mL (ref 211–911)

## 2017-09-04 ENCOUNTER — Encounter: Payer: Self-pay | Admitting: Cardiovascular Disease

## 2017-09-04 ENCOUNTER — Ambulatory Visit: Payer: BC Managed Care – PPO | Admitting: Cardiovascular Disease

## 2017-09-04 VITALS — BP 116/71 | HR 75 | Ht 67.0 in | Wt 196.2 lb

## 2017-09-04 DIAGNOSIS — E785 Hyperlipidemia, unspecified: Secondary | ICD-10-CM | POA: Diagnosis not present

## 2017-09-04 DIAGNOSIS — R000 Tachycardia, unspecified: Secondary | ICD-10-CM | POA: Diagnosis not present

## 2017-09-04 DIAGNOSIS — I10 Essential (primary) hypertension: Secondary | ICD-10-CM

## 2017-09-04 DIAGNOSIS — R0602 Shortness of breath: Secondary | ICD-10-CM

## 2017-09-04 NOTE — Patient Instructions (Signed)
Medication Instructions:  Your physician recommends that you continue on your current medications as directed. Please refer to the Current Medication list given to you today.  Labwork: FASTING LP/CMET SOON   Testing/Procedures: NONE  Follow-Up: Your physician wants you to follow-up in: 1 [place] will receive a reminder letter in the mail two months in advance. If you don't receive a letter, please call our office to schedule the follow-up appointment.  If you need a refill on your cardiac medications before your next appointment, please call your pharmacy.

## 2017-09-04 NOTE — Progress Notes (Signed)
Cardiology Office Note   Date:  09/04/2017   ID:  Fabricio Endsley, DOB 05-May-1971, MRN [id number]  PCP:  Marin Olp, MD  Cardiologist:   Skeet Latch, MD   Chief Complaint  Patient presents with  . Follow-up     Patient ID: Gregory Collins is a 47 y.o. male who presents with hypertension, hyperlipidemia and diabetes type 2 who presents for follow up. Gregory Collins was initially evaluated for palpitations and elevated heart rates.  He wore a 24 hour Holter 03/2015 which showed an average heart rate of 99 bpm.  He limited caffeine intake and started metoprolol which helped somewhat. He reported intermittent episodes of L sided chest pain and had an ETT on 05/21/15 that was negative for ischemia.  He reached 105% of his predicted max heart rate.  He continued to report dyspnea on exertion so he was encouraged to increase his exercise. Metoprolol was also increased to 25 mg twice daily for improved heart rate control.  Since his last appointment Gregory Collins has been well.  He exercises 2-3 times per week on the treadmill or bike.  He also lifts weights.  He has no chest pain or shortness of breath with this activity.  He walks on the [employer] and feels great.  He is just getting back into his exercise routine and has gained some weight.  He denies lower extremity edema, orthopnea or PND.     Past Medical History:  Diagnosis Date  . Allergy   . Asthma   . B12 deficiency   . Diabetes mellitus without complication (HCC)    diet controlled  . GERD (gastroesophageal reflux disease)   . Hiatal hernia   . Hyperlipidemia   . Hypertension   . Sinus tachycardia 06/29/2015    Past Surgical History:  Procedure Laterality Date  . LEG SURGERY     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  . UPPER GASTROINTESTINAL ENDOSCOPY       Current Outpatient Medications  Medication Sig Dispense Refill  . albuterol (PROAIR HFA) 108 (90 Base) MCG/ACT inhaler Inhale 2 puffs into the lungs every 4  (four) hours as needed for wheezing or shortness of breath. 8.5 Inhaler 1  . aspirin EC 81 MG tablet Take by mouth.    Marland Kitchen atorvastatin (LIPITOR) 20 MG tablet Take 1 tablet (20 mg total) daily by mouth. 90 tablet 1  . beclomethasone (QVAR) 40 MCG/ACT inhaler Inhale 1 puff into the lungs 2 (two) times daily. 1 Inhaler 2  . cyanocobalamin (,VITAMIN B-12,) 1000 MCG/ML injection INJECT 1ML INTO THE MUSCLE EVERY 14 DAYS 6 mL 0  . fluticasone (FLOVENT HFA) 44 MCG/ACT inhaler Inhale 1 puff into the lungs 2 (two) times daily. 1 Inhaler 5  . lansoprazole (PREVACID) 30 MG capsule Take 1 capsule (30 mg total) by mouth daily at 12 noon. (Patient taking differently: Take 30 mg by mouth 2 (two) times daily before a meal. ) 90 capsule 3  . lisinopril (PRINIVIL,ZESTRIL) 20 MG tablet Take 1 tablet (20 mg total) by mouth daily. 90 tablet 3  . meloxicam (MOBIC) 15 MG tablet Take 1 tablet (15 mg total) by mouth daily. 30 tablet 0  . metFORMIN (GLUCOPHAGE) 500 MG tablet Take 1 tablet (500 mg total) by mouth daily with breakfast. 90 tablet 1  . metoprolol tartrate (LOPRESSOR) 25 MG tablet Take 1 tablet (25 mg total) by mouth 2 (two) times daily. KEEP OV. 180 tablet 0  . tadalafil (CIALIS) 20 MG tablet  Take 0.5-1 tablets (10-20 mg total) by mouth every other day as needed for erectile dysfunction. 5 tablet 11   No current facility-administered medications for this visit.     Allergies:   Patient has no known allergies.    Social History:  The patient  reports that  has never smoked. he has never used smokeless tobacco. He reports that he drinks about 1.8 - 2.4 oz of alcohol per week. He reports that he does not use drugs.   Family History:  The patient's family history includes Diabetes in his father and mother; Hyperlipidemia in his father; Hypertension in his father; Kidney disease in his mother; Prostate cancer in his father.    ROS:  Please see the history of present illness.   Otherwise, review of systems are  positive for none.   All other systems are reviewed and negative.    PHYSICAL EXAM: VS:  BP 116/71   Pulse 75   Ht 5\' 7"  (1.702 m)   Wt 196 lb 3.2 oz (89 kg)   BMI 30.73 kg/m  , BMI Body mass index is 30.73 kg/m. GENERAL:  Well appearing HEENT: Pupils equal round and reactive, fundi not visualized, oral mucosa unremarkable NECK:  No jugular venous distention, waveform within normal limits, carotid upstroke brisk and symmetric, no bruits LUNGS:  Clear to auscultation bilaterally HEART:  RRR.  PMI not displaced or sustained,S1 and S2 within normal limits, no S3, no S4, no clicks, no rubs, no murmurs ABD:  Flat, positive bowel sounds normal in frequency in pitch, no bruits, no rebound, no guarding, no midline pulsatile mass, no hepatomegaly, no splenomegaly EXT:  2 plus pulses throughout, no edema, no cyanosis no clubbing SKIN:  No rashes no nodules NEURO:  Cranial nerves II through XII grossly intact, motor grossly intact throughout PSYCH:  Cognitively intact, oriented to person place and time   EKG:  EKG is ordered today. 6/713/17: Sinus rhythm. Rate 72 bpm. Inferior T wave inversions. Lateral T wave flattening. 09/04/17: Sinus rhythm.  Rate 75 bpm.   24 Hour Holter 03/29/15:  Quality: Fair. Baseline artifact. Predominant rhythm: sinus rhythm Average heart rate: 99 bpm Max heart rate: 141 bpm Min heart rate: 65 bpm Pauses >2.5 seconds: 0 Ventricular ectopics: 0  Supraventricular ectopics: 0 Patient did not submit a symptom diary  Recent Labs: 12/19/2016: ALT 18; BUN 16; Creatinine, Ser 1.00; Hemoglobin 14.1; Platelets 222.0; Potassium 4.3; Sodium 137    Lipid Panel    Component Value Date/Time   CHOL 173 12/19/2016 1223   TRIG 204.0 (H) 12/19/2016 1223   HDL 34.60 (L) 12/19/2016 1223   CHOLHDL 5 12/19/2016 1223   VLDL 40.8 (H) 12/19/2016 1223   LDLCALC 88 08/25/2015 1316   LDLDIRECT 100.0 12/19/2016 1223      Wt Readings from Last 3 Encounters:  09/04/17 196 lb  3.2 oz (89 kg)  06/05/17 191 lb (86.6 kg)  05/22/17 189 lb (85.7 kg)    Other studies Reviewed: Additional studies/ records that were reviewed today include:.Review of the above records demonstrates:  Please see elsewhere in the note.    ETT 05/21/15:    Blood pressure demonstrated a hypertensive response to exercise.  There was no ST segment deviation noted during stress.  Negative, adequate stress test. Excellent exercise capacity.  ASSESSMENT AND PLAN:  # Shortness of breath: Resolved.  Continue exercise.    # Hypertension: BP is well-controlled on current doses of lisinopril and metoprolol.  # CV Disease Prevention: ASCVD 10  year risk is 9.8%.  Continue aspirin 81 mg daily.  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  # Hyperlipidemia: Repeat lipids and CMP.  LDL goal is <70.   # Obesity:  Encouraged increased exercise.   Current medicines are reviewed at length with the patient today.  The patient does not have concerns regarding medicines.  The following changes have been made: No chagnes  Labs/ tests ordered today include:   Orders Placed This Encounter  Procedures  . Lipid panel  . Comprehensive metabolic panel  . EKG 12-Lead     Disposition:   FU with Dr. Jonelle Sidle C. Oval Linsey in 1 year  Signed, Skeet Latch, MD  09/04/2017 11:52 AM    [place]

## 2017-09-06 ENCOUNTER — Encounter: Payer: Self-pay | Admitting: Internal Medicine

## 2017-09-10 ENCOUNTER — Ambulatory Visit: Payer: BC Managed Care – PPO | Admitting: Podiatry

## 2017-09-10 DIAGNOSIS — L600 Ingrowing nail: Secondary | ICD-10-CM | POA: Diagnosis not present

## 2017-09-10 NOTE — Patient Instructions (Addendum)

## 2017-09-11 NOTE — Progress Notes (Signed)
   Subjective: Patient presents today for evaluation of throbbing pain to the medial border of the left great toe that began about three weeks ago. He reports associated erythema and swelling of the area. Patient is concerned for possible ingrown nail. Pressure from wearing shoes that are tight increases the pain. He has been trimming the nail with no significant relief. Patient presents today for further treatment and evaluation.  Past Medical History:  Diagnosis Date  . Allergy   . Asthma   . B12 deficiency   . Diabetes mellitus without complication (HCC)    diet controlled  . GERD (gastroesophageal reflux disease)   . Hiatal hernia   . Hyperlipidemia   . Hypertension   . Sinus tachycardia 06/29/2015    Objective:  General: Well developed, nourished, in no acute distress, alert and oriented x3   Dermatology: Skin is warm, dry and supple bilateral. Medial border of the left great toe appears to be erythematous with evidence of an ingrowing nail. Pain on palpation noted to the border of the nail fold. The remaining nails appear unremarkable at this time. There are no open sores, lesions.  Vascular: Dorsalis Pedis artery and Posterior Tibial artery pedal pulses palpable. No lower extremity edema noted.   Neruologic: Grossly intact via light touch bilateral.  Musculoskeletal: Muscular strength within normal limits in all groups bilateral. Normal range of motion noted to all pedal and ankle joints.   Assesement: #1 Paronychia with ingrowing nail medial border left great toe #2 Pain in toe #3 Incurvated nail  Plan of Care:  1. Patient evaluated.  2. Discussed treatment alternatives and plan of care. Explained nail avulsion procedure and post procedure course to patient. 3. Patient opted for more conservative treatment at this time.  4. Mechanical debridement of the right great toenail performed using a nail nipper. Filed with dremel without incident.  5. Recommended wide fitting  shoes.  6. Recommended routine nail care at a pedicure salon.  7. Return to clinic as needed.   Edrick Kins, DPM [facility]  Dr. Edrick Kins, [address]                                        [place], [place] [address]                Office [phone]  Fax [phone]

## 2017-09-12 ENCOUNTER — Ambulatory Visit: Payer: BC Managed Care – PPO | Admitting: Family Medicine

## 2017-09-12 ENCOUNTER — Encounter: Payer: Self-pay | Admitting: Family Medicine

## 2017-09-12 VITALS — BP 118/74 | HR 83 | Temp 98.0°F | Ht 67.0 in | Wt 196.8 lb

## 2017-09-12 DIAGNOSIS — R109 Unspecified abdominal pain: Secondary | ICD-10-CM | POA: Diagnosis not present

## 2017-09-12 DIAGNOSIS — E785 Hyperlipidemia, unspecified: Secondary | ICD-10-CM

## 2017-09-12 LAB — COMPREHENSIVE METABOLIC PANEL
ALT: 21 U/L (ref 0–53)
AST: 16 U/L (ref 0–37)
Albumin: 4.6 g/dL (ref 3.5–5.2)
Alkaline Phosphatase: 55 U/L (ref 39–117)
BUN: 13 mg/dL (ref 6–23)
CHLORIDE: 99 meq/L (ref 96–112)
CO2: 31 mEq/L (ref 19–32)
Calcium: 9.6 mg/dL (ref 8.4–10.5)
Creatinine, Ser: 1.03 mg/dL (ref 0.40–1.50)
GFR: 100.33 mL/min (ref >60.00)
GLUCOSE: 155 mg/dL — AB (ref 70–99)
POTASSIUM: 4.3 meq/L (ref 3.5–5.1)
SODIUM: 137 meq/L (ref 135–145)
Total Bilirubin: 0.4 mg/dL (ref 0.2–1.2)
Total Protein: 7.4 g/dL (ref 6.0–8.3)

## 2017-09-12 LAB — CBC
HEMATOCRIT: 42.1 % (ref 39.0–52.0)
Hemoglobin: 14.2 g/dL (ref 13.0–17.0)
MCHC: 33.6 g/dL (ref 30.0–36.0)
MCV: 86 fl (ref 78.0–100.0)
Platelets: 203 10*3/uL (ref 150.0–400.0)
RBC: 4.9 Mil/uL (ref 4.22–5.81)
RDW: 14.3 % (ref 11.5–15.5)
WBC: 5.7 10*3/uL (ref 4.0–10.5)

## 2017-09-12 LAB — LIPASE: Lipase: 32 U/L (ref 11.0–59.0)

## 2017-09-12 LAB — LIPID PANEL
Cholesterol: 171 mg/dL (ref 0–200)
HDL: 41.9 mg/dL (ref >39.00)
LDL CALC: 95 mg/dL (ref 0–99)
NONHDL: 129.12
Total CHOL/HDL Ratio: 4
Triglycerides: 172 mg/dL — ABNORMAL HIGH (ref 0.0–149.0)
VLDL: 34.4 mg/dL (ref 0.0–40.0)

## 2017-09-12 LAB — H. PYLORI ANTIBODY, IGG: H PYLORI IGG: NEGATIVE

## 2017-09-12 MED ORDER — GI COCKTAIL ~~LOC~~
30.0000 mL | Freq: Once | ORAL | Status: AC
  Administered 2017-09-12: 30 mL via ORAL

## 2017-09-12 NOTE — Patient Instructions (Signed)
We will check blood work today.  You may need an ultrasound or a endoscopy depending on the results.  We will call you when the tests come back.  Take care, Dr Jerline Pain

## 2017-09-12 NOTE — Progress Notes (Signed)
    Subjective:  Gregory Collins is a 47 y.o. male who presents today for same-day appointment with a chief complaint of abdominal pain.   HPI:  Abdominal Pain, acute issue Symptoms started 2-3 weeks ago.  Pain located in middle and upper abdomen.  Pain is been stable over the last couple weeks.  Had a little bit of nausea for the first several days which has since resolved.  No vomiting.  No constipation or diarrhea.  No hematochezia.  No melena.  He has small, soft bowel movements about 3 times daily-this is normal for him.  No fevers or chills.  No clear or obvious precipitating events.  He has not tried any treatments at home.  He has had some increased bloating and flatulence.  Patient is not sure if symptoms are improved or worsened with eating.  Notes that occasionally symptoms will feel better after bowel movement.  No weight loss.  No early satiety.  No other obvious alleviating or aggravating factors.  ROS: Per HPI  PMH: He reports that  has never smoked. he has never used smokeless tobacco. He reports that he drinks about 1.8 - 2.4 oz of alcohol per week. He reports that he does not use drugs.  Objective:  Physical Exam: BP 118/74 (BP Location: Left Arm, Patient Position: Sitting, Cuff Size: Normal)   Pulse 83   Temp 98 F (36.7 C) (Oral)   Ht 5\' 7"  (1.702 m)   Wt 196 lb 12.8 oz (89.3 kg)   SpO2 98%   BMI 30.82 kg/m   Gen: NAD, resting comfortably CV: RRR with no murmurs appreciated Pulm: NWOB, CTAB with no crackles, wheezes, or rhonchi GI: Normal bowel sounds present.  Soft, nondistended.  Diffusely tender to palpation, with most tenderness in epigastric area.  Murphy sign negative.  No rebound or guarding.  Assessment/Plan:  Abdominal pain Broad differential.  His abdominal exam is largely benign and he does not have any red flag signs or symptoms.  Patient was given a GI cocktail in the office today with significant improvement in his symptoms-suggesting an upper GI  etiology.  Patient had endoscopy done a couple of years ago which revealed gastritis with intestinal metaplasia.  We will check H. pylori antibody, CMET, and lipase today.  Instructed patient to continue his Prevacid 30 mg twice daily for the time being.  If above negative, will need to be seen by GI for likely endoscopic evaluation.  Strict return precautions reviewed.  Algis [place]. Jerline Pain, MD 09/12/2017 8:51 AM

## 2017-09-13 ENCOUNTER — Encounter: Payer: Self-pay | Admitting: Family Medicine

## 2017-09-14 ENCOUNTER — Other Ambulatory Visit: Payer: Self-pay

## 2017-09-17 LAB — HM DIABETES EYE EXAM

## 2017-09-20 ENCOUNTER — Ambulatory Visit: Payer: BC Managed Care – PPO | Admitting: Podiatry

## 2017-09-26 ENCOUNTER — Encounter: Payer: Self-pay | Admitting: Cardiovascular Disease

## 2017-09-27 ENCOUNTER — Encounter: Payer: Self-pay | Admitting: Physician Assistant

## 2017-09-27 ENCOUNTER — Ambulatory Visit: Payer: BC Managed Care – PPO | Admitting: Physician Assistant

## 2017-09-27 VITALS — BP 124/76 | HR 84 | Ht 67.0 in | Wt 195.8 lb

## 2017-09-27 DIAGNOSIS — R1013 Epigastric pain: Secondary | ICD-10-CM

## 2017-09-27 DIAGNOSIS — K219 Gastro-esophageal reflux disease without esophagitis: Secondary | ICD-10-CM

## 2017-09-27 MED ORDER — PANTOPRAZOLE SODIUM 40 MG PO TBEC: 40.0000 mg | DELAYED_RELEASE_TABLET | Freq: Two times a day (BID) | ORAL | 3 refills | Status: DC

## 2017-09-27 NOTE — Patient Instructions (Addendum)
If you are age 46 or older, your body mass index should be between 23-30. Your Body mass index is 30.67 kg/m. If this is out of the aforementioned range listed, please consider follow up with your Primary Care Provider.  If you are age 79 or younger, your body mass index should be between 19-25. Your Body mass index is 30.67 kg/m. If this is out of the aformentioned range listed, please consider follow up with your Primary Care Provider.   We have sent the following medications to your pharmacy for you to pick up at your convenience: Pantoprazole 40mg  twice a day. Take 30 minutes to one hour before breakfast and dinner.  Stop taking Prevacid.  Please call the office with an update of your symptoms in the next two weeks and ask for Katina Degree, RN.

## 2017-09-27 NOTE — Progress Notes (Addendum)
Chief Complaint: Abdominal pain, GERD  HPI:    [name] is a 47 year old African-American male with a past medical history of reflux and others listed below, who follows with Dr. Hilarie Fredrickson and who was referred to me by Marin Olp, MD for a complaint of abdominal pain and GERD.      EGD 06/14/16 with 2 cm hiatal hernia, gastric mucosal atrophy.  Biopsy showed chronic gastritis with intestinal metaplasia.  Colonoscopy 05/22/17 with 2 3-4 mm polyps in the descending and distal transverse colon and otherwise normal.  Pathology showing tubular adenomas.  Repeat recommended in 5 years.    Today, patient explains that for the past 3 weeks he has developed a epigastric/generalized abdominal pain which occurs on and off, irregardless of eating or not.  Along with this also some gas and bloating.  Describes this pain as a "burning" at first.  Also describes waking up with irritation in his throat.  Describes these symptoms seem similar to when he was first diagnosed with reflux.  Over the past 3 weeks everything has gotten a little bit better, but is not gone yet.  Continues his Prevacid 30 mg twice daily which he has been on for years.    Denies fever, chills, weight loss, anorexia, nausea, vomiting, recent use of NSAIDs, recent illness, increase of stress or anxiety or change in other medications.  Past Medical History:  Diagnosis Date  . Allergy   . Asthma   . B12 deficiency   . Diabetes mellitus without complication (HCC)    diet controlled  . GERD (gastroesophageal reflux disease)   . Hiatal hernia   . Hyperlipidemia   . Hypertension   . Sinus tachycardia 06/29/2015    Past Surgical History:  Procedure Laterality Date  . LEG SURGERY     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  . UPPER GASTROINTESTINAL ENDOSCOPY      Current Outpatient Medications  Medication Sig Dispense Refill  . albuterol (PROAIR HFA) 108 (90 Base) MCG/ACT inhaler Inhale 2 puffs into the lungs every 4 (four) hours as needed for  wheezing or shortness of breath. 8.5 Inhaler 1  . aspirin EC 81 MG tablet Take by mouth.    Marland Kitchen atorvastatin (LIPITOR) 20 MG tablet Take 1 tablet (20 mg total) daily by mouth. 90 tablet 1  . beclomethasone (QVAR) 40 MCG/ACT inhaler Inhale 1 puff into the lungs 2 (two) times daily. 1 Inhaler 2  . cyanocobalamin (,VITAMIN B-12,) 1000 MCG/ML injection INJECT 1ML INTO THE MUSCLE EVERY 14 DAYS 6 mL 0  . fluticasone (FLOVENT HFA) 44 MCG/ACT inhaler Inhale 1 puff into the lungs 2 (two) times daily. 1 Inhaler 5  . lisinopril (PRINIVIL,ZESTRIL) 20 MG tablet Take 1 tablet (20 mg total) by mouth daily. 90 tablet 3  . metFORMIN (GLUCOPHAGE) 500 MG tablet Take 1 tablet (500 mg total) by mouth daily with breakfast. 90 tablet 1  . metoprolol tartrate (LOPRESSOR) 25 MG tablet Take 1 tablet (25 mg total) by mouth 2 (two) times daily. KEEP OV. 180 tablet 0  . tadalafil (CIALIS) 20 MG tablet Take 0.5-1 tablets (10-20 mg total) by mouth every other day as needed for erectile dysfunction. 5 tablet 11  . pantoprazole (PROTONIX) 40 MG tablet Take 1 tablet (40 mg total) by mouth 2 (two) times daily. Take 30 minutes to one hour before breakfast and dinner. 60 tablet 3   No current facility-administered medications for this visit.     Allergies as of 09/27/2017  . (No Known  Allergies)    Family History  Problem Relation Age of Onset  . Diabetes Mother   . Kidney disease Mother   . Diabetes Father   . Prostate cancer Father   . Hyperlipidemia Father        mother  . Hypertension Father        mother  . Colon cancer Neg Hx   . Colon polyps Neg Hx   . Esophageal cancer Neg Hx   . Rectal cancer Neg Hx   . Stomach cancer Neg Hx   . Pancreatic cancer Neg Hx     Social History   Socioeconomic History  . Marital status: Married    Spouse name: Not on file  . Number of children: 2  . Years of education: Not on file  . Highest education level: Not on file  Occupational History  . Occupation: professor     Employer: [employer]  Social Needs  . Financial resource strain: Not on file  . Food insecurity:    Worry: Not on file    Inability: Not on file  . Transportation needs:    Medical: Not on file    Non-medical: Not on file  Tobacco Use  . Smoking status: Never Smoker  . Smokeless tobacco: Never Used  Substance and Sexual Activity  . Alcohol use: Yes    Alcohol/week: 1.8 - 2.4 oz    Types: 3 - 4 Cans of beer per week    Comment: social  . Drug use: No  . Sexual activity: Yes    Birth control/protection: None  Lifestyle  . Physical activity:    Days per week: Not on file    Minutes per session: Not on file  . Stress: Not on file  Relationships  . Social connections:    Talks on phone: Not on file    Gets together: Not on file    Attends religious service: Not on file    Active member of club or organization: Not on file    Attends meetings of clubs or organizations: Not on file    Relationship status: Not on file  . Intimate partner violence:    Fear of current or ex partner: Not on file    Emotionally abused: Not on file    Physically abused: Not on file    Forced sexual activity: Not on file  Other Topics Concern  . Not on file  Social History Narrative   Married 20 years in 2017. 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    Catering manager, Counselling psychologist-       Hobbies: family time,[place] golfing, basketball with son, walking at park    Review of Systems:    Constitutional: No weight loss, fever or chills Cardiovascular: No chest pain Respiratory: No SOB  Gastrointestinal: See HPI and otherwise negative   Physical Exam:  Vital signs: BP 124/76   Pulse 84   Ht 5\' 7"  (1.702 m)   Wt 195 lb 12.8 oz (88.8 kg)   SpO2 98%   BMI 30.67 kg/m   Constitutional:   Pleasant AA male appears to be in NAD, Well developed, Well nourished, alert and cooperative Respiratory: Respirations even  and unlabored. Lungs clear to auscultation bilaterally.   No wheezes, crackles, or rhonchi.  Cardiovascular: Normal S1, S2. No MRG. Regular rate and rhythm. No peripheral edema, cyanosis or pallor.  Gastrointestinal:  Soft, nondistended, mild epigastric ttp,. No rebound or guarding. Normal bowel sounds. No appreciable masses or hepatomegaly. Rectal:  Not performed.  Psychiatric: Demonstrates good judgement and reason without abnormal affect or behaviors.  RELEVANT LABS AND IMAGING: CBC    Component Value Date/Time   WBC 5.7 09/12/2017 0917   RBC 4.90 09/12/2017 0917   HGB 14.2 09/12/2017 0917   HCT 42.1 09/12/2017 0917   PLT 203.0 09/12/2017 0917   MCV 86.0 09/12/2017 0917   MCV 110.7 (A) 11/10/2014 1549   MCH 29.0 08/25/2015 1316   MCHC 33.6 09/12/2017 0917   RDW 14.3 09/12/2017 0917   LYMPHSABS 1.7 11/01/2016 1118   MONOABS 0.5 11/01/2016 1118   EOSABS 0.5 11/01/2016 1118   BASOSABS 0.0 11/01/2016 1118    CMP     Component Value Date/Time   NA 137 09/12/2017 0917   K 4.3 09/12/2017 0917   CL 99 09/12/2017 0917   CO2 31 09/12/2017 0917   GLUCOSE 155 (H) 09/12/2017 0917   BUN 13 09/12/2017 0917   CREATININE 1.03 09/12/2017 0917   CREATININE 1.00 08/25/2015 1316   CALCIUM 9.6 09/12/2017 0917   PROT 7.4 09/12/2017 0917   ALBUMIN 4.6 09/12/2017 0917   AST 16 09/12/2017 0917   ALT 21 09/12/2017 0917   ALKPHOS 55 09/12/2017 0917   BILITOT 0.4 09/12/2017 0917   GFRNONAA >89 11/12/2013 1623   GFRAA >89 11/12/2013 1623    Assessment: 1.  Epigastric abdominal pain: With below, consider tachyphylaxis of Prevacid 2.  GERD: Increase in morning symptoms recently over the past 3 weeks  Plan: 1.  Discussed possible tachyphylaxis of Prevacid.  Recommend he discontinue this.  Prescribed Pantoprazole 40 mg twice daily, 30-60 minutes before breakfast and dinner.  #60 with 3 refills 2.  Patient questions possible imaging, I do not think this is necessary now, but if he sees no  improvement with change above will discuss repeat EGD +/- abdominal imaging. 3.  My nurse Katina Degree will call the patient in 2 weeks to see how he is doing. 4.  Patient to follow in clinic with me in 4 weeks or sooner if necessary.  Ellouise Newer, PA-C [facility] 09/27/2017, 10:39 AM  Cc: Marin Olp, MD   Addendum: Reviewed and agree with management decisions. Pyrtle, Lajuan Lines, MD

## 2017-10-02 ENCOUNTER — Encounter: Payer: Self-pay | Admitting: Cardiovascular Disease

## 2017-10-02 ENCOUNTER — Ambulatory Visit: Payer: BC Managed Care – PPO | Admitting: Allergy and Immunology

## 2017-10-02 ENCOUNTER — Encounter: Payer: Self-pay | Admitting: Allergy and Immunology

## 2017-10-02 ENCOUNTER — Encounter: Payer: Self-pay | Admitting: Physician Assistant

## 2017-10-02 VITALS — BP 130/90 | HR 86 | Temp 97.8°F | Resp 16

## 2017-10-02 DIAGNOSIS — J3089 Other allergic rhinitis: Secondary | ICD-10-CM

## 2017-10-02 DIAGNOSIS — J4530 Mild persistent asthma, uncomplicated: Secondary | ICD-10-CM

## 2017-10-02 MED ORDER — ALBUTEROL SULFATE HFA 108 (90 BASE) MCG/ACT IN AERS: 2.0000 | INHALATION_SPRAY | RESPIRATORY_TRACT | 1 refills | Status: DC | PRN

## 2017-10-02 MED ORDER — FLUTICASONE PROPIONATE HFA 44 MCG/ACT IN AERO: 2.0000 | INHALATION_SPRAY | Freq: Two times a day (BID) | RESPIRATORY_TRACT | 5 refills | Status: DC

## 2017-10-02 NOTE — Patient Instructions (Addendum)
Mild persistent asthma Currently well controlled.  For now, continue Flovent HFA 44 g, 2 inhalations via spacer device twice daily, and albuterol HFA, 1-2 inhalations every 6 hours if needed.  I have also recommended using albuterol approximately 15 minutes prior to vigorous exercise.  If subjective and objective measures of pulmonary function remain stable, we will consider stepping down therapy on the next visit.  Allergic rhinitis Stable.  Continue appropriate allergen avoidance measures and over-the-counter antihistamines if needed.  Nasal saline spray (i.e. Simply Saline) is recommended as needed.   Return in about 6 months (around 04/04/2018), or if symptoms worsen or fail to improve.

## 2017-10-02 NOTE — Progress Notes (Signed)
Follow-up Note  RE: Gregory Collins MRN: [id number] DOB: December 14, 1970 Date of Office Visit: 10/02/2017  Primary care provider: Marin Olp, MD Referring provider: Marin Olp, MD  History of present illness: Gregory Collins is a 47 y.o. male with persistent asthma and allergic rhinitis presenting today for follow up.  He was last seen in this clinic in January 2018.  He reports that in the interval since his previous visit his upper and lower respiratory symptoms have been well controlled.  He rarely requires albuterol rescue, typically only with vigorous exercise, and does not experience limitations in daily activities or nocturnal awakenings due to lower respiratory symptoms.  He is currently taking Flovent 44 g, 2 inhalations via spacer device twice daily.  He does not use albuterol prior to vigorous exercise.  His nasal symptoms have been well controlled without medication.  Assessment and plan: Mild persistent asthma Currently well controlled.  For now, continue Flovent HFA 44 g, 2 inhalations via spacer device twice daily, and albuterol HFA, 1-2 inhalations every 6 hours if needed.  I have also recommended using albuterol approximately 15 minutes prior to vigorous exercise.  If subjective and objective measures of pulmonary function remain stable, we will consider stepping down therapy on the next visit.  Allergic rhinitis Stable.  Continue appropriate allergen avoidance measures and over-the-counter antihistamines if needed.  Nasal saline spray (i.e. Simply Saline) is recommended as needed.   Meds ordered this encounter  Medications  . albuterol (PROAIR HFA) 108 (90 Base) MCG/ACT inhaler    Sig: Inhale 2 puffs into the lungs every 4 (four) hours as needed for wheezing or shortness of breath.    Dispense:  8.5 Inhaler    Refill:  1  . fluticasone (FLOVENT HFA) 44 MCG/ACT inhaler    Sig: Inhale 2 puffs into the lungs 2 (two) times daily.    Dispense:   1 Inhaler    Refill:  5    Diagnostics: Spirometry:  Normal with an FEV1 of 94% predicted.  Please see scanned spirometry results for details.    Physical examination: Blood pressure 130/90, pulse 86, temperature 97.8 F (36.6 C), temperature source Oral, resp. rate 16, SpO2 96 %.  General: Alert, interactive, in no acute distress. HEENT: TMs pearly gray, turbinates mildly edematous without discharge, post-pharynx unremarkable. Neck: Supple without lymphadenopathy. Lungs: Clear to auscultation without wheezing, rhonchi or rales. CV: Normal S1, S2 without murmurs. Skin: Warm and dry, without lesions or rashes.  The following portions of the patient's history were reviewed and updated as appropriate: allergies, current medications, past family history, past medical history, past social history, past surgical history and problem list.  Allergies as of 10/02/2017   No Known Allergies     Medication List        Accurate as of 10/02/17 10:42 AM. Always use your most recent med list.          albuterol 108 (90 Base) MCG/ACT inhaler Commonly known as:  PROAIR HFA Inhale 2 puffs into the lungs every 4 (four) hours as needed for wheezing or shortness of breath.   aspirin EC 81 MG tablet Take by mouth.   atorvastatin 20 MG tablet Commonly known as:  LIPITOR Take 1 tablet (20 mg total) daily by mouth.   cyanocobalamin 1000 MCG/ML injection Commonly known as:  (VITAMIN B-12) INJECT 1ML INTO THE MUSCLE EVERY 14 DAYS   fluticasone 44 MCG/ACT inhaler Commonly known as:  FLOVENT HFA Inhale 2 puffs into the  lungs 2 (two) times daily.   lisinopril 20 MG tablet Commonly known as:  PRINIVIL,ZESTRIL Take 1 tablet (20 mg total) by mouth daily.   metFORMIN 500 MG tablet Commonly known as:  GLUCOPHAGE Take 1 tablet (500 mg total) by mouth daily with breakfast.   metoprolol tartrate 25 MG tablet Commonly known as:  LOPRESSOR Take 1 tablet (25 mg total) by mouth 2 (two) times daily.  KEEP OV.   pantoprazole 40 MG tablet Commonly known as:  PROTONIX Take 1 tablet (40 mg total) by mouth 2 (two) times daily. Take 30 minutes to one hour before breakfast and dinner.   tadalafil 20 MG tablet Commonly known as:  CIALIS Take 0.5-1 tablets (10-20 mg total) by mouth every other day as needed for erectile dysfunction.       No Known Allergies  I appreciate the opportunity to take part in Gregory Collins's care. Please do not hesitate to contact me with questions.  Sincerely,   R. Edgar Frisk, MD

## 2017-10-02 NOTE — Assessment & Plan Note (Signed)
Stable.  Continue appropriate allergen avoidance measures and over-the-counter antihistamines if needed.  Nasal saline spray (i.e. Simply Saline) is recommended as needed.

## 2017-10-02 NOTE — Assessment & Plan Note (Addendum)
Currently well controlled.  For now, continue Flovent HFA 44 g, 2 inhalations via spacer device twice daily, and albuterol HFA, 1-2 inhalations every 6 hours if needed.  I have also recommended using albuterol approximately 15 minutes prior to vigorous exercise.  If subjective and objective measures of pulmonary function remain stable, we will consider stepping down therapy on the next visit.

## 2017-10-03 ENCOUNTER — Telehealth: Payer: Self-pay

## 2017-10-03 ENCOUNTER — Other Ambulatory Visit: Payer: Self-pay

## 2017-10-03 DIAGNOSIS — R1013 Epigastric pain: Secondary | ICD-10-CM

## 2017-10-03 NOTE — Telephone Encounter (Signed)
-----   Message from [place], [place] sent at 10/03/2017  8:41 AM EDT ----- Regarding: Please order Ct abdomen with contrast Please order Ct abdomen with contrast for further eval of patient's ongoing abdominal pain. I told patient our office would be contacting him in regards to appt time. Thank you-JLL

## 2017-10-03 NOTE — Telephone Encounter (Signed)
Patient scheduled for CT abdomen on 4/5 at [place]. Sent patient message with instructions. I have put the one bottle of oral contrast at our front desk with instructions to drink it one hour prior per [facility] in [place], patient to be NPO 2 hours prior since only getting abdomen scan.

## 2017-10-12 ENCOUNTER — Ambulatory Visit (INDEPENDENT_AMBULATORY_CARE_PROVIDER_SITE_OTHER)
Admission: RE | Admit: 2017-10-12 | Discharge: 2017-10-12 | Disposition: A | Discharge status: Home / Self Care | Payer: BC Managed Care – PPO | Source: Ambulatory Visit | Attending: Physician Assistant | Admitting: Physician Assistant

## 2017-10-12 DIAGNOSIS — R1013 Epigastric pain: Secondary | ICD-10-CM

## 2017-10-12 MED ORDER — IOPAMIDOL (ISOVUE-300) INJECTION 61%
100.0000 mL | Freq: Once | INTRAVENOUS | Status: AC | PRN
  Administered 2017-10-12: 100 mL via INTRAVENOUS

## 2017-10-18 ENCOUNTER — Other Ambulatory Visit: Payer: Self-pay | Admitting: Internal Medicine

## 2017-10-25 ENCOUNTER — Ambulatory Visit: Payer: BC Managed Care – PPO | Admitting: Physician Assistant

## 2017-11-23 ENCOUNTER — Ambulatory Visit (INDEPENDENT_AMBULATORY_CARE_PROVIDER_SITE_OTHER): Payer: BC Managed Care – PPO | Admitting: Family Medicine

## 2017-11-23 ENCOUNTER — Encounter: Payer: Self-pay | Admitting: Family Medicine

## 2017-11-23 VITALS — BP 130/90 | HR 90 | Temp 98.4°F | Resp 16 | Ht 67.0 in | Wt 196.4 lb

## 2017-11-23 DIAGNOSIS — R05 Cough: Secondary | ICD-10-CM | POA: Diagnosis not present

## 2017-11-23 DIAGNOSIS — R059 Cough, unspecified: Secondary | ICD-10-CM

## 2017-11-23 DIAGNOSIS — J029 Acute pharyngitis, unspecified: Secondary | ICD-10-CM

## 2017-11-23 LAB — POCT RAPID STREP A (OFFICE): RAPID STREP A SCREEN: POSITIVE — AB

## 2017-11-23 MED ORDER — BENZONATATE 200 MG PO CAPS: 200.0000 mg | ORAL_CAPSULE | Freq: Two times a day (BID) | ORAL | 0 refills | Status: DC | PRN

## 2017-11-23 MED ORDER — AMOXICILLIN 875 MG PO TABS: 875.0000 mg | ORAL_TABLET | Freq: Two times a day (BID) | ORAL | 0 refills | Status: DC

## 2017-11-23 NOTE — Progress Notes (Signed)
    Subjective:  Gregory Collins is a 47 y.o. male who presents today for same-day appointment with a chief complaint of sore throat.   HPI:  Sore Throat, acute problem Started 2 days ago. Symptoms stable over the past few days. Children have been sick with similar symptoms. Associated some fatigue, fever, chills, and body aches. Worse with eating and drinking. A little worse on the left. Tried OTC meds which have not helped. No other obvious alleviating or aggravating factors.   ROS: Per HPI  PMH: He reports that he has never smoked. He has never used smokeless tobacco. He reports that he drinks about 1.8 - 2.4 oz of alcohol per week. He reports that he does not use drugs.  Objective:  Physical Exam: BP 130/90 (BP Location: Left Arm, Patient Position: Sitting, Cuff Size: Normal)   Pulse 90   Temp 98.4 F (36.9 C) (Oral)   Resp 16   Ht 5\' 7"  (1.702 m)   Wt 196 lb 6 oz (89.1 kg)   SpO2 96%   BMI 30.76 kg/m   Gen: NAD, resting comfortably HEENT: TMs clear bilaterally. OP erythematous with tonsillith noted in left tonsil.  CV: RRR with no murmurs appreciated Pulm: NWOB, CTAB with no crackles, wheezes, or rhonchi  Results for orders placed or performed in visit on 11/23/17 (from the past 24 hour(s))  POCT rapid strep A     Status: Abnormal   Collection Time: 11/23/17 10:47 AM  Result Value Ref Range   Rapid Strep A Screen Positive (A) Negative   Assessment/Plan:  Cough/strep pharyngitis Rapid strep positive.  Start 1 week course of amoxicillin.  Start Tessalon for his cough.  Discussed other supportive measures including good oral hydration and Tylenol/or Motrin as needed for low-grade fever and pain.  Return precautions reviewed.  Follow-up as needed.  Algis Greenhouse. Jerline Pain, MD 11/23/2017 10:47 AM

## 2017-11-23 NOTE — Patient Instructions (Signed)
You have strep throat. Please start the amoxicillin.   Start tessalon for your cough.  Please stay well hydrated.  You can take tylenol and/or motrin as needed for low grade fever and pain.  Please let me know if your symptoms worsen or fail to improve.  Take care, Dr Jerline Pain

## 2017-12-11 ENCOUNTER — Other Ambulatory Visit (INDEPENDENT_AMBULATORY_CARE_PROVIDER_SITE_OTHER): Payer: BC Managed Care – PPO

## 2017-12-11 DIAGNOSIS — E538 Deficiency of other specified B group vitamins: Secondary | ICD-10-CM | POA: Diagnosis not present

## 2017-12-11 LAB — VITAMIN B12: Vitamin B-12: 1151 pg/mL — ABNORMAL HIGH (ref 211–911)

## 2017-12-18 ENCOUNTER — Other Ambulatory Visit: Payer: Self-pay | Admitting: Physician Assistant

## 2017-12-23 ENCOUNTER — Other Ambulatory Visit: Payer: Self-pay | Admitting: Family Medicine

## 2017-12-24 NOTE — Progress Notes (Signed)
Phone: [phone]  Subjective:  Patient presents today for their annual physical. Chief complaint-noted.   See problem oriented charting- ROS- full  review of systems was completed and negative including No chest pain or shortness of breath. No headache or blurry vision.   The following were reviewed and entered/updated in epic: Past Medical History:  Diagnosis Date  . Allergy   . Asthma   . B12 deficiency   . Diabetes mellitus without complication (HCC)    diet controlled  . GERD (gastroesophageal reflux disease)   . Hiatal hernia   . Hyperlipidemia   . Hypertension   . Sinus tachycardia 06/29/2015   Patient Active Problem List   Diagnosis Date Noted  . Diabetes mellitus without complication ([place]) 74/82/7078    Priority: High  . Mild persistent asthma 08/03/2015    Priority: Medium  . Sinus tachycardia 06/29/2015    Priority: Medium  . Pernicious anemia 03/29/2015    Priority: Medium  . Hyperlipidemia 11/10/2014    Priority: Medium  . Essential hypertension 11/10/2014    Priority: Medium  . GERD (gastroesophageal reflux disease) 06/20/2011    Priority: Medium  . Erectile dysfunction 07/28/2016    Priority: Low  . Allergic rhinitis 11/16/2015    Priority: Low  . Migraine 08/20/2011    Priority: Low   Past Surgical History:  Procedure Laterality Date  . LEG SURGERY     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  . UPPER GASTROINTESTINAL ENDOSCOPY      Family History  Problem Relation Age of Onset  . Diabetes Mother   . Kidney disease Mother   . Diabetes Father   . Prostate cancer Father   . Hyperlipidemia Father        mother  . Hypertension Father        mother  . Colon cancer Neg Hx   . Colon polyps Neg Hx   . Esophageal cancer Neg Hx   . Rectal cancer Neg Hx   . Stomach cancer Neg Hx   . Pancreatic cancer Neg Hx   . Allergic rhinitis Neg Hx   . Angioedema Neg Hx   . Asthma Neg Hx   . Eczema Neg Hx   . Immunodeficiency Neg Hx   . Urticaria Neg Hx      Medications- reviewed and updated Current Outpatient Medications  Medication Sig Dispense Refill  . albuterol (PROAIR HFA) 108 (90 Base) MCG/ACT inhaler Inhale 2 puffs into the lungs every 4 (four) hours as needed for wheezing or shortness of breath. 8.5 Inhaler 1  . aspirin EC 81 MG tablet Take by mouth.    Marland Kitchen atorvastatin (LIPITOR) 20 MG tablet Take 1 tablet (20 mg total) daily by mouth. 90 tablet 1  . cyanocobalamin (,VITAMIN B-12,) 1000 MCG/ML injection INJECT 1ML INTO THE MUSCLE EVERY 14 DAYS 6 mL 0  . fluticasone (FLOVENT HFA) 44 MCG/ACT inhaler Inhale 2 puffs into the lungs 2 (two) times daily. 1 Inhaler 5  . lisinopril (PRINIVIL,ZESTRIL) 20 MG tablet Take 1 tablet (20 mg total) by mouth daily. 90 tablet 3  . metFORMIN (GLUCOPHAGE) 500 MG tablet TAKE 1 TABLET BY MOUTH EVERY DAY WITH BREAKFAST 90 tablet 1  . metoprolol tartrate (LOPRESSOR) 25 MG tablet Take 1 tablet (25 mg total) by mouth 2 (two) times daily. KEEP OV. 180 tablet 0  . pantoprazole (PROTONIX) 40 MG tablet TAKE 1 TABLET (40 MG TOTAL) BY MOUTH 2 TIMES DAILY.TAKE 30 MINUTES TO 1 HR BEFORE BREAKFAST & DINNER 60 tablet 2  .  tadalafil (CIALIS) 20 MG tablet Take 0.5-1 tablets (10-20 mg total) by mouth every other day as needed for erectile dysfunction. 5 tablet 11   No current facility-administered medications for this visit.     Allergies-reviewed and updated No Known Allergies  Social History   Social History Narrative   Married 20 years in 2017. 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    Catering manager, Counselling psychologist-       Hobbies: family time,[place] golfing, basketball with son, walking at park    Objective: BP 132/78 (BP Location: Left Arm, Patient Position: Sitting, Cuff Size: Large)   Pulse 74   Temp 98.1 F (36.7 C) (Oral)   Ht 5' 7.25" (1.708 m)   Wt 195 lb (88.5 kg)   SpO2 97%   BMI 30.31 kg/m  Gen: NAD, resting  comfortably HEENT: Mucous membranes are moist. Oropharynx normal Neck: no thyromegaly CV: RRR no murmurs rubs or gallops Lungs: CTAB no crackles, wheeze, rhonchi Abdomen: soft/nontender/nondistended/normal bowel sounds. No rebound or guarding.  Ext: no edema Skin: warm, dry Neuro: grossly normal, moves all extremities, PERRLA Rectal: normal tone, normal sized prostate, no masses or tenderness  Assessment/Plan:  47 y.o. male presenting for annual physical.  Health Maintenance counseling: 1. Anticipatory guidance: Patient counseled regarding regular dental exams -q6 months, eye exams -yearly, wearing seatbelts.  2. Risk factor reduction:  Advised patient of need for regular exercise and diet rich and fruits and vegetables to reduce risk of heart attack and stroke. Exercise- 3-4 x a week last CPE, has struggled some since that time- he has just started back. Diet-trying to eat at home more and less fast food, wife preparing some diabetic meals that he has found, trying to cut out soda- regular soda 1-2 x a week but knows not ideal.  Wt Readings from Last 3 Encounters:  12/25/17 195 lb (88.5 kg)  11/23/17 196 lb 6 oz (89.1 kg)  09/27/17 195 lb 12.8 oz (88.8 kg)  3. Immunizations/screenings/ancillary studies- offered Tdap and pneumovax 23 today and declines AGAIN though last visit he said he would do this- No prior immunizations on file 4. Prostate cancer screening-  he follows with urologist in [place] and last year reported PSA 0.6- we will update PSA again today. His urologist left- he will follow with [place] unless issues develop with PSA trend or rectal exam- low risk exam today Lab Results  Component Value Date   PSA 2.54 11/01/2016   PSA 0.62 09/11/2013   5. Colon cancer screening - 05/22/17 with 5 year repeat due to polyp history  Status of chronic or acute concerns   Diabetes- well controlled last visit on metformin 538m daily- update a1c.   HTN- controlled  on lisinopril 263m  metoprolol 2526mID  HLD- on atorvastatin 59m75mpdate lipids  Low b12- q14 day injections. He will discuss with Dr. PyrtHilarie Fredricksont visit about reducing frequency.   Asthma- on flovent 44 mcg 2 puffs BID through Dr. BobbVerlin FesterRD- on protonix per GI at 40mg48mough Dr. PyrtlHilarie Fredricksonlis helpful for ED  4-6 month follow up  Lab/Order associations: Preventative health care  Diabetes mellitus without complication (HCC) [place]: Comp Met (CMET), Hemoglobin A1c, Lipid panel, CBC  Prostate cancer screening - Plan: PSA  Return precautions advised.  StephGarret Reddish

## 2017-12-25 ENCOUNTER — Ambulatory Visit (INDEPENDENT_AMBULATORY_CARE_PROVIDER_SITE_OTHER): Payer: BC Managed Care – PPO | Admitting: Family Medicine

## 2017-12-25 ENCOUNTER — Encounter: Payer: Self-pay | Admitting: Family Medicine

## 2017-12-25 VITALS — BP 132/78 | HR 74 | Temp 98.1°F | Ht 67.25 in | Wt 195.0 lb

## 2017-12-25 DIAGNOSIS — K219 Gastro-esophageal reflux disease without esophagitis: Secondary | ICD-10-CM

## 2017-12-25 DIAGNOSIS — J4530 Mild persistent asthma, uncomplicated: Secondary | ICD-10-CM

## 2017-12-25 DIAGNOSIS — E119 Type 2 diabetes mellitus without complications: Secondary | ICD-10-CM | POA: Diagnosis not present

## 2017-12-25 DIAGNOSIS — I10 Essential (primary) hypertension: Secondary | ICD-10-CM | POA: Diagnosis not present

## 2017-12-25 DIAGNOSIS — E785 Hyperlipidemia, unspecified: Secondary | ICD-10-CM

## 2017-12-25 DIAGNOSIS — Z125 Encounter for screening for malignant neoplasm of prostate: Secondary | ICD-10-CM | POA: Diagnosis not present

## 2017-12-25 DIAGNOSIS — Z0000 Encounter for general adult medical examination without abnormal findings: Secondary | ICD-10-CM

## 2017-12-25 LAB — LIPID PANEL
CHOL/HDL RATIO: 5
CHOLESTEROL: 178 mg/dL (ref 0–200)
HDL: 36.5 mg/dL — AB (ref >39.00)
NonHDL: 141.04
TRIGLYCERIDES: 271 mg/dL — AB (ref 0.0–149.0)
VLDL: 54.2 mg/dL — AB (ref 0.0–40.0)

## 2017-12-25 LAB — COMPREHENSIVE METABOLIC PANEL
ALT: 21 U/L (ref 0–53)
AST: 15 U/L (ref 0–37)
Albumin: 4.7 g/dL (ref 3.5–5.2)
Alkaline Phosphatase: 63 U/L (ref 39–117)
BUN: 13 mg/dL (ref 6–23)
CALCIUM: 9.3 mg/dL (ref 8.4–10.5)
CHLORIDE: 99 meq/L (ref 96–112)
CO2: 28 meq/L (ref 19–32)
Creatinine, Ser: 1.11 mg/dL (ref 0.40–1.50)
GFR: 91.92 mL/min (ref >60.00)
Glucose, Bld: 151 mg/dL — ABNORMAL HIGH (ref 70–99)
POTASSIUM: 4.3 meq/L (ref 3.5–5.1)
Sodium: 137 mEq/L (ref 135–145)
Total Bilirubin: 0.5 mg/dL (ref 0.2–1.2)
Total Protein: 7.4 g/dL (ref 6.0–8.3)

## 2017-12-25 LAB — CBC
HCT: 42.6 % (ref 39.0–52.0)
Hemoglobin: 14.2 g/dL (ref 13.0–17.0)
MCHC: 33.4 g/dL (ref 30.0–36.0)
MCV: 85.9 fl (ref 78.0–100.0)
Platelets: 212 10*3/uL (ref 150.0–400.0)
RBC: 4.95 Mil/uL (ref 4.22–5.81)
RDW: 14.3 % (ref 11.5–15.5)
WBC: 5.1 10*3/uL (ref 4.0–10.5)

## 2017-12-25 LAB — PSA: PSA: 0.51 ng/mL (ref 0.10–4.00)

## 2017-12-25 LAB — HEMOGLOBIN A1C: Hgb A1c MFr Bld: 7.4 % — ABNORMAL HIGH (ref 4.6–6.5)

## 2017-12-25 LAB — LDL CHOLESTEROL, DIRECT: LDL DIRECT: 97 mg/dL

## 2017-12-25 NOTE — Progress Notes (Signed)
Your hemoglobin A1c has jumped up to 7.4.  Team, if he is agreeable- please increase his metformin to 500 mg twice a day.  I would recommend he see [place] back in 3 months and a day to recheck a1c with POC test.  Your CBC was normal (blood counts, infection fighting cells, platelets). Your CMET was normal (kidney, liver, and electrolytes, blood sugar) other than sugar being slightly high  Your cholesterol is reasonably well controlled though your triglycerides went up- healthy eating and regular exercise as well as weight loss can help get this back down.  Your PSA trend was low risk for prostate cancer.

## 2017-12-25 NOTE — Patient Instructions (Addendum)
Health Maintenance Due  Topic Date Due  . PNEUMOCOCCAL POLYSACCHARIDE VACCINE (1)-declined for today 06/20/1974  . TETANUS/TDAP -declined for today. 06/21/1991  . FOOT EXAM-today at office visit 04/12/2017  . OPHTHALMOLOGY EXAM - Team can you please get records from [facility] on [place] 09/15/2017  . HEMOGLOBIN A1C -will do in office today 12/03/2017   Lets set a goal of 5 lbs off by next visit  4-6 month follow up. 4 months if a1c above 7, 6 months if below 7  Please stop by lab before you go

## 2017-12-26 ENCOUNTER — Other Ambulatory Visit: Payer: Self-pay

## 2017-12-26 MED ORDER — METFORMIN HCL 500 MG PO TABS: 500.0000 mg | ORAL_TABLET | Freq: Two times a day (BID) | ORAL | 1 refills | Status: DC

## 2018-01-02 ENCOUNTER — Encounter: Payer: Self-pay | Admitting: Family Medicine

## 2018-01-08 ENCOUNTER — Encounter: Payer: Self-pay | Admitting: Internal Medicine

## 2018-01-09 ENCOUNTER — Other Ambulatory Visit: Payer: Self-pay | Admitting: Internal Medicine

## 2018-01-25 ENCOUNTER — Other Ambulatory Visit: Payer: Self-pay | Admitting: Internal Medicine

## 2018-01-29 ENCOUNTER — Other Ambulatory Visit: Payer: Self-pay | Admitting: Family Medicine

## 2018-02-01 ENCOUNTER — Other Ambulatory Visit: Payer: Self-pay

## 2018-02-01 MED ORDER — CYANOCOBALAMIN 1000 MCG/ML IJ SOLN: 1000.0000 ug | INTRAMUSCULAR | 0 refills | Status: DC

## 2018-03-05 ENCOUNTER — Other Ambulatory Visit: Payer: Self-pay | Admitting: Allergy and Immunology

## 2018-03-05 DIAGNOSIS — J4530 Mild persistent asthma, uncomplicated: Secondary | ICD-10-CM

## 2018-03-31 ENCOUNTER — Other Ambulatory Visit: Payer: Self-pay | Admitting: Internal Medicine

## 2018-04-17 ENCOUNTER — Other Ambulatory Visit: Payer: Self-pay | Admitting: Internal Medicine

## 2018-05-02 ENCOUNTER — Encounter: Payer: Self-pay | Admitting: Family Medicine

## 2018-05-03 ENCOUNTER — Ambulatory Visit: Payer: BC Managed Care – PPO | Admitting: Family Medicine

## 2018-05-06 NOTE — Telephone Encounter (Signed)
Please contact patient to schedule for acute appointment with PCP on tomorrow or another provider today if possible

## 2018-05-08 ENCOUNTER — Ambulatory Visit: Payer: BC Managed Care – PPO | Admitting: Podiatry

## 2018-05-21 ENCOUNTER — Encounter: Payer: Self-pay | Admitting: Internal Medicine

## 2018-05-23 ENCOUNTER — Encounter: Payer: Self-pay | Admitting: Family Medicine

## 2018-05-28 ENCOUNTER — Encounter: Payer: Self-pay | Admitting: Family Medicine

## 2018-05-28 ENCOUNTER — Ambulatory Visit: Payer: BC Managed Care – PPO | Admitting: Family Medicine

## 2018-05-28 ENCOUNTER — Other Ambulatory Visit: Payer: Self-pay | Admitting: Family Medicine

## 2018-05-28 VITALS — BP 122/74 | HR 80 | Temp 97.6°F | Ht 67.25 in | Wt 198.2 lb

## 2018-05-28 DIAGNOSIS — I10 Essential (primary) hypertension: Secondary | ICD-10-CM | POA: Diagnosis not present

## 2018-05-28 DIAGNOSIS — E119 Type 2 diabetes mellitus without complications: Secondary | ICD-10-CM

## 2018-05-28 DIAGNOSIS — R21 Rash and other nonspecific skin eruption: Secondary | ICD-10-CM

## 2018-05-28 DIAGNOSIS — E785 Hyperlipidemia, unspecified: Secondary | ICD-10-CM

## 2018-05-28 LAB — POCT GLYCOSYLATED HEMOGLOBIN (HGB A1C): HEMOGLOBIN A1C: 6.6 % — AB (ref 4.0–5.6)

## 2018-05-28 NOTE — Progress Notes (Signed)
Subjective:  Gregory Collins is a 47 y.o. year old very pleasant male patient who presents for/with See problem oriented charting ROS-does have a rash with some scaling on the foot.  No chest pain or shortness of breath reported.  No low blood sugars reported  Past Medical History-  Patient Active Problem List   Diagnosis Date Noted  . Diabetes mellitus without complication ([place]) 89/21/1941    Priority: High  . Mild persistent asthma 08/03/2015    Priority: Medium  . Sinus tachycardia 06/29/2015    Priority: Medium  . Pernicious anemia 03/29/2015    Priority: Medium  . Hyperlipidemia 11/10/2014    Priority: Medium  . Essential hypertension 11/10/2014    Priority: Medium  . GERD (gastroesophageal reflux disease) 06/20/2011    Priority: Medium  . Erectile dysfunction 07/28/2016    Priority: Low  . Allergic rhinitis 11/16/2015    Priority: Low  . Migraine 08/20/2011    Priority: Low    Medications- reviewed and updated Current Outpatient Medications  Medication Sig Dispense Refill  . albuterol (PROVENTIL HFA;VENTOLIN HFA) 108 (90 Base) MCG/ACT inhaler INHALE 2 PUFFS INTO THE LUNGS EVERY 4 (FOUR) HOURS AS NEEDED FOR WHEEZING OR SHORTNESS OF BREATH. 8.5 Inhaler 0  . aspirin EC 81 MG tablet Take by mouth.    Marland Kitchen atorvastatin (LIPITOR) 20 MG tablet TAKE 1 TABLET (20 MG TOTAL) DAILY BY MOUTH. 90 tablet 1  . cyanocobalamin (,VITAMIN B-12,) 1000 MCG/ML injection INJECT 1ML INTO THE MUSCLE EVERY 14 DAYS 6 mL 0  . fluticasone (FLOVENT HFA) 44 MCG/ACT inhaler Inhale 2 puffs into the lungs 2 (two) times daily. 1 Inhaler 5  . lisinopril (PRINIVIL,ZESTRIL) 20 MG tablet Take 1 tablet (20 mg total) by mouth daily. 90 tablet 3  . metFORMIN (GLUCOPHAGE) 500 MG tablet TAKE 1 TABLET (500 MG TOTAL) BY MOUTH 2 (TWO) TIMES DAILY WITH A MEAL. 90 tablet 1  . metoprolol tartrate (LOPRESSOR) 25 MG tablet Take 1 tablet (25 mg total) by mouth 2 (two) times daily. KEEP OV. 180 tablet 0  . pantoprazole  (PROTONIX) 40 MG tablet TAKE 1 TABLET (40 MG TOTAL) BY MOUTH 2 TIMES DAILY.TAKE 30 MINUTES TO 1 HR BEFORE BREAKFAST & DINNER 180 tablet 0  . tadalafil (CIALIS) 20 MG tablet Take 0.5-1 tablets (10-20 mg total) by mouth every other day as needed for erectile dysfunction. 5 tablet 11   No current facility-administered medications for this visit.     Objective: BP 122/74 (BP Location: Left Arm, Patient Position: Sitting, Cuff Size: Large)   Pulse 80   Temp 97.6 F (36.4 C) (Oral)   Ht 5' 7.25" (1.708 m)   Wt 198 lb 3.2 oz (89.9 kg)   SpO2 95%   BMI 30.81 kg/m  Gen: NAD, resting comfortably CV: RRR no murmurs rubs or gallops Lungs: CTAB no crackles, wheeze, rhonchi Abdomen: soft/nontender/nondistended/normal bowel sounds. No rebound or guarding.  Ext: no edema Skin: warm, dry- on medial border of hindfoot there is some scaling with some hyperpigmentation underneath and about 5 x 1 cm distribution Neuro: Speech normal, moves all extremities  Assessment/Plan: Other notes: 1.  Rash on foot appears to be athlete's foot-treat with Lotrimin over-the-counter.  Follow-up if does not improve  Hypertension S: controlled on lisinopril 20 mg, metoprolol 25 mg twice daily BP Readings from Last 3 Encounters:  05/28/18 122/74  12/25/17 132/78  11/23/17 130/90  A/P: We discussed blood pressure goal of <140/90. Continue current meds   Hyperlipidemia S: Reasonably controlled  on atorvastatin 20 mg with last LDL under 100 Lab Results  Component Value Date   CHOL 178 12/25/2017   HDL 36.50 (L) 12/25/2017   LDLCALC 95 09/12/2017   LDLDIRECT 97.0 12/25/2017   TRIG 271.0 (H) 12/25/2017   CHOLHDL 5 12/25/2017   A/P: Stable-continue current medications  Diabetes mellitus without complication ([place]) S: Poorly controlled on metformin 500mg  daily-increased to twice a day last visit.  He set a goal of 5 lbs off from last visit- he has slipped up 3 lbs.  Lab Results  Component Value Date   HGBA1C 7.4  (H) 12/25/2017   HGBA1C 6.1 06/05/2017   HGBA1C 7.4 (H) 12/19/2016   A/P: Updated point-of-care A1c today shows improved control at 6.6-continue current medications  Future Appointments  Date Time Provider [place]  06/17/2018  9:00 AM [facility] PRE[facility]IT [facility]  06/27/2018 10:00 AM Pyrtle, Lajuan Lines, MD [facility]  09/04/2018  9:40 AM Marin Olp, MD [facility]   Return in about 14 weeks (around 09/03/2018) for follow up- or sooner if needed.  Return precautions advised.  Garret Reddish, MD

## 2018-05-28 NOTE — Addendum Note (Signed)
Addended by: Kevan Ny on: 05/28/2018 11:37 AM   Modules accepted: Orders

## 2018-05-28 NOTE — Patient Instructions (Addendum)
Health Maintenance Due  Topic Date Due  . PNEUMOCOCCAL POLYSACCHARIDE VACCINE AGE 46-64 HIGH RISK - perhaps next year 06/20/1974  . TETANUS/TDAP - schedule a nurse visit that works better for your schedule 06/21/1991  . INFLUENZA VACCINE - schedule a nurse visit that works better for your schedule 02/07/2018   If you get Tet[facility] and flu shot at [facility]- please send [place] a message so we can update the system.   Try lotrimin for athlete's foot for at least 2 weeks- if not improving come back to see [place]- can use this for at least a week past resolution of rash.   Congrats on improved a1c but I still want to see you work on healthy eating and regular exercise

## 2018-05-28 NOTE — Assessment & Plan Note (Signed)
S: Poorly controlled on metformin 500mg  daily-increased to twice a day last visit.  He set a goal of 5 lbs off from last visit- he has slipped up 3 lbs.  Lab Results  Component Value Date   HGBA1C 7.4 (H) 12/25/2017   HGBA1C 6.1 06/05/2017   HGBA1C 7.4 (H) 12/19/2016   A/P: Updated point-of-care A1c today shows improved control at 6.6-continue current medications

## 2018-06-08 ENCOUNTER — Other Ambulatory Visit: Payer: Self-pay | Admitting: Internal Medicine

## 2018-06-09 ENCOUNTER — Encounter: Payer: Self-pay | Admitting: Family Medicine

## 2018-06-20 ENCOUNTER — Other Ambulatory Visit: Payer: Self-pay

## 2018-06-20 DIAGNOSIS — J4530 Mild persistent asthma, uncomplicated: Secondary | ICD-10-CM

## 2018-06-20 MED ORDER — FLUTICASONE PROPIONATE HFA 44 MCG/ACT IN AERO: 2.0000 | INHALATION_SPRAY | Freq: Two times a day (BID) | RESPIRATORY_TRACT | 1 refills | Status: DC

## 2018-06-20 NOTE — Telephone Encounter (Signed)
Prescription refill request for Flovent 44. I sent a refill until patient is seen in office.

## 2018-06-21 ENCOUNTER — Other Ambulatory Visit: Payer: Self-pay | Admitting: Allergy and Immunology

## 2018-06-21 DIAGNOSIS — J4530 Mild persistent asthma, uncomplicated: Secondary | ICD-10-CM

## 2018-06-27 ENCOUNTER — Encounter: Payer: BC Managed Care – PPO | Admitting: Internal Medicine

## 2018-07-15 ENCOUNTER — Other Ambulatory Visit: Payer: Self-pay | Admitting: Family Medicine

## 2018-07-23 ENCOUNTER — Encounter: Payer: Self-pay | Admitting: Family Medicine

## 2018-07-23 ENCOUNTER — Ambulatory Visit: Payer: BC Managed Care – PPO | Admitting: Family Medicine

## 2018-07-23 VITALS — BP 118/68 | HR 67 | Temp 97.9°F | Ht 67.0 in | Wt 198.6 lb

## 2018-07-23 DIAGNOSIS — R05 Cough: Secondary | ICD-10-CM | POA: Diagnosis not present

## 2018-07-23 DIAGNOSIS — J4530 Mild persistent asthma, uncomplicated: Secondary | ICD-10-CM | POA: Diagnosis not present

## 2018-07-23 DIAGNOSIS — B370 Candidal stomatitis: Secondary | ICD-10-CM

## 2018-07-23 DIAGNOSIS — Z79899 Other long term (current) drug therapy: Secondary | ICD-10-CM

## 2018-07-23 DIAGNOSIS — R052 Subacute cough: Secondary | ICD-10-CM

## 2018-07-23 DIAGNOSIS — E119 Type 2 diabetes mellitus without complications: Secondary | ICD-10-CM

## 2018-07-23 MED ORDER — PREDNISONE 20 MG PO TABS: ORAL_TABLET | ORAL | 0 refills | Status: DC

## 2018-07-23 MED ORDER — NYSTATIN 100000 UNIT/ML MT SUSP: 5.0000 mL | Freq: Four times a day (QID) | OROMUCOSAL | 0 refills | Status: DC

## 2018-07-23 NOTE — Progress Notes (Addendum)
Subjective:  Gregory Collins is a 48 y.o. year old very pleasant male patient who presents for/with See problem oriented charting ROS-patient denies shortness of breath, wheeze, chest pain.  No edema.  No fever reported.  Past Medical History-  Patient Active Problem List   Diagnosis Date Noted  . Diabetes mellitus without complication ([place]) 09/73/5329    Priority: High  . Mild persistent asthma 08/03/2015    Priority: Medium  . Sinus tachycardia 06/29/2015    Priority: Medium  . Pernicious anemia 03/29/2015    Priority: Medium  . Hyperlipidemia 11/10/2014    Priority: Medium  . Essential hypertension 11/10/2014    Priority: Medium  . GERD (gastroesophageal reflux disease) 06/20/2011    Priority: Medium  . Erectile dysfunction 07/28/2016    Priority: Low  . Allergic rhinitis 11/16/2015    Priority: Low  . Migraine 08/20/2011    Priority: Low    Medications- reviewed and updated Current Outpatient Medications  Medication Sig Dispense Refill  . albuterol (PROVENTIL HFA;VENTOLIN HFA) 108 (90 Base) MCG/ACT inhaler INHALE 2 PUFFS INTO THE LUNGS EVERY 4 (FOUR) HOURS AS NEEDED FOR WHEEZING OR SHORTNESS OF BREATH. 8.5 Inhaler 0  . aspirin EC 81 MG tablet Take by mouth.    Marland Kitchen atorvastatin (LIPITOR) 20 MG tablet TAKE 1 TABLET (20 MG TOTAL) DAILY BY MOUTH. 90 tablet 1  . cyanocobalamin (,VITAMIN B-12,) 1000 MCG/ML injection INJECT 1ML INTO THE MUSCLE EVERY 14 DAYS 6 mL 0  . cyanocobalamin (,VITAMIN B-12,) 1000 MCG/ML injection INJECT 1 ML (1,000 MCG TOTAL) INTO THE MUSCLE EVERY 30 (THIRTY) DAYS. 3 mL 1  . fluticasone (FLOVENT HFA) 44 MCG/ACT inhaler Inhale 2 puffs into the lungs 2 (two) times daily. 1 Inhaler 1  . lisinopril (PRINIVIL,ZESTRIL) 20 MG tablet TAKE 1 TABLET BY MOUTH EVERY DAY 90 tablet 3  . metFORMIN (GLUCOPHAGE) 500 MG tablet TAKE 1 TABLET (500 MG TOTAL) BY MOUTH 2 (TWO) TIMES DAILY WITH A MEAL. 90 tablet 1  . metoprolol tartrate (LOPRESSOR) 25 MG tablet Take 1 tablet  (25 mg total) by mouth 2 (two) times daily. KEEP OV. 180 tablet 0  . pantoprazole (PROTONIX) 40 MG tablet TAKE 1 TABLET (40 MG TOTAL) BY MOUTH 2 TIMES DAILY.TAKE 30 MINUTES TO 1 HR BEFORE BREAKFAST & DINNER 180 tablet 0  . tadalafil (CIALIS) 20 MG tablet Take 0.5-1 tablets (10-20 mg total) by mouth every other day as needed for erectile dysfunction. 5 tablet 11  . nystatin (MYCOSTATIN) 100000 UNIT/ML suspension Take 5 mLs (500,000 Units total) by mouth 4 (four) times daily. Swish and spit out for 7 days 150 mL 0  . predniSONE (DELTASONE) 20 MG tablet Take 1 tablet by mouth daily for 5 days, then 1/2 tablet daily for 2 days 6 tablet 0   No current facility-administered medications for this visit.     Objective: BP 118/68 (BP Location: Left Arm, Patient Position: Sitting, Cuff Size: Large)   Pulse 67   Temp 97.9 F (36.6 C) (Oral)   Ht 5\' 7"  (1.702 m)   Wt 198 lb 9.6 oz (90.1 kg)   SpO2 94%   BMI 31.11 kg/m  Gen: NAD, resting comfortably No sinus pressure- clear discharge/rhinorrhea-mildly erythematous nasal turbinates.  Pharynx and soft palate with white plaques-easily scraped off.  Tympanic membranes  normal CV: RRR no murmurs rubs or gallops Lungs: CTAB no crackles, wheeze, rhonchi Ext: no edema Skin: warm, dry Neuro: Speech normal  Assessment/Plan:   Subacute cough  S: On and off  cough for 3 weeks Tried delsym, humidifier without relief He denies wheezing. No chest tightness.  Still using flovent, not having to use albuterol No shortness of breath.  Denies feeling bad overall Seems to be worse at night- cant sleep as well due to cough.  No sinus pressure. Getting some yellow discharge out of nose and coughing up some of this.   He does take lisinopril which can cause chronic cough.   Patient was not aware of whitish discoloration in the roof of mouth though does report has had thrush in the past  A/P: 48 year old male with subacute cough- possible mild asthma exacerbation  versus lingering URI versus bronchitis versus effect of lisinopril.  A1c reasonably controlled in November-should be reasonable to trial prednisone  He also has some thrush likely due to Flovent-since also placing on prednisone we went ahead and started nystatin swish and spit From AVS:  "   Patient Instructions  Lets try to get this cough resolved before doing the flu, pneumonia, tetanus shot  Lets trial a low-dose of prednisone for 7 days-doing low-dose because of your diabetes.  This may still raise your blood sugar some-try to remain active during this time and watch your food intake.  Please trial your albuterol before bedtime if you are getting into coughing fits to see if that helps  Also noted some mild thrush-use nystatin swish and spit for 7 days  If the prednisone does not resolve the cough-lets give this another 2 weeks-if still not resolved let see each other back-would likely get x-ray.  We may also need to consider switching your lisinopril if it is that persistent     "  Future Appointments  Date Time Provider [place]  09/02/2018  8:00 AM Skeet Latch, MD [facility]  09/04/2018  9:40 AM Yong Channel, Brayton Mars, MD [facility]   Lab/Order associations:  Meds ordered this encounter  Medications  . predniSONE (DELTASONE) 20 MG tablet    Sig: Take 1 tablet by mouth daily for 5 days, then 1/2 tablet daily for 2 days    Dispense:  6 tablet    Refill:  0  . nystatin (MYCOSTATIN) 100000 UNIT/ML suspension    Sig: Take 5 mLs (500,000 Units total) by mouth 4 (four) times daily. Swish and spit out for 7 days    Dispense:  150 mL    Refill:  0   Return precautions advised.  Garret Reddish, MD

## 2018-07-23 NOTE — Patient Instructions (Addendum)
Lets try to get this cough resolved before doing the flu, pneumonia, tetanus shot  Lets trial a low-dose of prednisone for 7 days-doing low-dose because of your diabetes.  This may still raise your blood sugar some-try to remain active during this time and watch your food intake.  Please trial your albuterol before bedtime if you are getting into coughing fits to see if that helps  Also noted some mild thrush-use nystatin swish and spit for 7 days  If the prednisone does not resolve the cough-lets give this another 2 weeks-if still not resolved let see each other back-would likely get x-ray.  We may also need to consider switching your lisinopril if it is that persistent

## 2018-07-30 ENCOUNTER — Encounter: Payer: Self-pay | Admitting: Family Medicine

## 2018-08-04 ENCOUNTER — Other Ambulatory Visit: Payer: Self-pay | Admitting: Family Medicine

## 2018-08-23 ENCOUNTER — Ambulatory Visit: Payer: BC Managed Care – PPO | Admitting: Family Medicine

## 2018-08-23 ENCOUNTER — Encounter: Payer: Self-pay | Admitting: Family Medicine

## 2018-08-23 VITALS — BP 128/88 | HR 78 | Resp 18

## 2018-08-23 DIAGNOSIS — J3089 Other allergic rhinitis: Secondary | ICD-10-CM

## 2018-08-23 DIAGNOSIS — K219 Gastro-esophageal reflux disease without esophagitis: Secondary | ICD-10-CM | POA: Diagnosis not present

## 2018-08-23 DIAGNOSIS — J4530 Mild persistent asthma, uncomplicated: Secondary | ICD-10-CM | POA: Diagnosis not present

## 2018-08-23 MED ORDER — FLUTICASONE PROPIONATE HFA 44 MCG/ACT IN AERO: 2.0000 | INHALATION_SPRAY | Freq: Two times a day (BID) | RESPIRATORY_TRACT | 5 refills | Status: DC

## 2018-08-23 MED ORDER — ALBUTEROL SULFATE HFA 108 (90 BASE) MCG/ACT IN AERS: 2.0000 | INHALATION_SPRAY | RESPIRATORY_TRACT | 1 refills | Status: DC | PRN

## 2018-08-23 NOTE — Addendum Note (Signed)
Addended by: Dara Hoyer on: 08/23/2018 03:57 PM   Modules accepted: Orders

## 2018-08-23 NOTE — Progress Notes (Signed)
Gregory Collins [id number] Dept: [phone]  FOLLOW UP NOTE  Patient ID: Gregory Collins, male    DOB: [birth year]  Age: 48 y.o. MRN: [id number] Date of Office Visit: 08/23/2018  Assessment  Chief Complaint: Asthma and Medication Refill  HPI Gregory Collins is a 48 year old male who presents to the clinic for a follow up visit. He was last seen in the clinic on 10/02/2017 by Dr. Verlin Fester for evaluation of asthma and allergic rhinitis.  At today's visit, he reports his asthma has been well controlled with no shortness of breath or wheeze with activity or rest. He reports a dry cough which occurs a few mornings during the week. He is currently using Flovent 44 -2 puffs twice a day with a spacer and albuterol before exercise. Allergic rhinitis is reported as well controlled with no medical intervention at this time. He reports occasional reflux for which he takes Protonix 40 mg once a day. His current medications are listed in the chart.    Drug Allergies:  No Known Allergies  Physical Exam: BP 128/88 (BP Location: Right Arm, Patient Position: Sitting, Cuff Size: Normal)   Pulse 78   Resp 18   SpO2 97%    Physical Exam Vitals signs reviewed.  Constitutional:      Appearance: Normal appearance.  HENT:     Head: Normocephalic and atraumatic.     Nose: Nose normal.     Mouth/Throat:     Pharynx: Oropharynx is clear.  Eyes:     Conjunctiva/sclera: Conjunctivae normal.  Neck:     Musculoskeletal: Normal range of motion and neck supple.  Cardiovascular:     Rate and Rhythm: Normal rate and regular rhythm.     Heart sounds: Normal heart sounds. No murmur.  Pulmonary:     Effort: Pulmonary effort is normal.     Breath sounds: Normal breath sounds.     Comments: Lungs clear to auscultation Musculoskeletal: Normal range of motion.  Skin:    General: Skin is warm and dry.  Neurological:     Mental Status: He is alert and oriented to person, place, and  time.  Psychiatric:        Mood and Affect: Mood normal.        Behavior: Behavior normal.        Thought Content: Thought content normal.        Judgment: Judgment normal.     Diagnostics: FVC 3.75, FEV1 2.86. Predicted FVC 3.71, predicted FEV1 3.04. Spirometry is within the normal range.  Assessment and Plan: 1. Mild persistent asthma without complication   2. Allergic rhinitis   3. Gastroesophageal reflux disease, esophagitis presence not specified     Meds ordered this encounter  Medications  . albuterol (PROVENTIL HFA;VENTOLIN HFA) 108 (90 Base) MCG/ACT inhaler    Sig: Inhale 2 puffs into the lungs every 4 (four) hours as needed for wheezing or shortness of breath.    Dispense:  18 Inhaler    Refill:  1  . fluticasone (FLOVENT HFA) 44 MCG/ACT inhaler    Sig: Inhale 2 puffs into the lungs 2 (two) times daily.    Dispense:  1 Inhaler    Refill:  5    Patient Instructions  Mild persistent asthma without complication Continue Flovent 44- 2 puffs twice a day with a spacer to prevent cough and wheeze Continue Proventil 2 puffs every 4 hours as needed for cough or wheeze. You may use Proventil 2  puffs 5-15 minutes before exercise  Allergic rhinitis Continue Flonase 2 sprays in each nostril once a day as needed for a stuffy nose Continue cetirizine 10 mg as needed for a runny nose  Reflux Continue Protonix 40 mg once a day as previously written Continue lifestyle modifications for reflux as listed below  Call us if this treatment plan is not working well for you  Follow up in 6 months or sooner if needed   Lifestyle Changes for Controlling GERD  When you have GERD, stomach acid feels as if it's backing up toward your mouth. Whether or not you take medication to control your GERD, your symptoms can often be improved with lifestyle changes.   Raise Your Head  Reflux is more likely to strike when you're lying down flat, because stomach fluid can  flow backward more  easily. Raising the head of your bed 4-6 inches can help. To do this:  Slide blocks or books under the legs at the head of your bed. Or, place a wedge under  the mattress. Many foam stores can make a suitable wedge for you. The wedge  should run from your waist to the top of your head.  Don't just prop your head on several pillows. This increases pressure on your  stomach. It can make GERD worse.  Watch Your Eating Habits Certain foods may increase the acid in your stomach or relax the lower esophageal sphincter, making GERD more likely. It's best to avoid the following:  Coffee, tea, and carbonated drinks (with and without caffeine)  Fatty, fried, or spicy food  Mint, chocolate, onions, and tomatoes  Any other foods that seem to irritate your stomach or cause you pain  Relieve the Pressure  Eat smaller meals, even if you have to eat more often.  Don't lie down right after you eat. Wait a few hours for your stomach to empty.  Avoid tight belts and tight-fitting clothes.  Lose excess weight.  Tobacco and Alcohol  Avoid smoking tobacco and drinking alcohol. They can make GERD symptoms worse.     Return in about 6 months (around 02/21/2019), or if symptoms worsen or fail to improve.    Thank you for the opportunity to care for this patient.  Please do not hesitate to contact me with questions.  Gareth Morgan, FNP [facility]

## 2018-08-23 NOTE — Patient Instructions (Addendum)
Mild persistent asthma without complication Continue Flovent 44- 2 puffs twice a day with a spacer to prevent cough and wheeze Continue Proventil 2 puffs every 4 hours as needed for cough or wheeze. You may use Proventil 2 puffs 5-15 minutes before exercise  Allergic rhinitis Continue Flonase 2 sprays in each nostril once a day as needed for a stuffy nose Continue cetirizine 10 mg as needed for a runny nose  Reflux Continue Protonix 40 mg once a day as previously written Continue lifestyle modifications for reflux as listed below  Call us if this treatment plan is not working well for you  Follow up in 6 months or sooner if needed   Lifestyle Changes for Controlling GERD  When you have GERD, stomach acid feels as if it's backing up toward your mouth. Whether or not you take medication to control your GERD, your symptoms can often be improved with lifestyle changes.   Raise Your Head  Reflux is more likely to strike when you're lying down flat, because stomach fluid can  flow backward more easily. Raising the head of your bed 4-6 inches can help. To do this:  Slide blocks or books under the legs at the head of your bed. Or, place a wedge under  the mattress. Many foam stores can make a suitable wedge for you. The wedge  should run from your waist to the top of your head.  Don't just prop your head on several pillows. This increases pressure on your  stomach. It can make GERD worse.  Watch Your Eating Habits Certain foods may increase the acid in your stomach or relax the lower esophageal sphincter, making GERD more likely. It's best to avoid the following:  Coffee, tea, and carbonated drinks (with and without caffeine)  Fatty, fried, or spicy food  Mint, chocolate, onions, and tomatoes  Any other foods that seem to irritate your stomach or cause you pain  Relieve the Pressure  Eat smaller meals, even if you have to eat more often.  Don't lie down right after you  eat. Wait a few hours for your stomach to empty.  Avoid tight belts and tight-fitting clothes.  Lose excess weight.  Tobacco and Alcohol  Avoid smoking tobacco and drinking alcohol. They can make GERD symptoms worse.

## 2018-09-02 ENCOUNTER — Ambulatory Visit: Payer: BC Managed Care – PPO | Admitting: Cardiovascular Disease

## 2018-09-04 ENCOUNTER — Ambulatory Visit: Payer: BC Managed Care – PPO | Admitting: Family Medicine

## 2018-09-14 ENCOUNTER — Other Ambulatory Visit: Payer: Self-pay | Admitting: Allergy and Immunology

## 2018-09-24 ENCOUNTER — Ambulatory Visit: Payer: BC Managed Care – PPO | Admitting: Family Medicine

## 2018-10-10 ENCOUNTER — Other Ambulatory Visit: Payer: Self-pay | Admitting: Internal Medicine

## 2018-10-10 ENCOUNTER — Other Ambulatory Visit: Payer: Self-pay | Admitting: Cardiovascular Disease

## 2018-10-11 ENCOUNTER — Encounter: Payer: Self-pay | Admitting: *Deleted

## 2018-10-12 ENCOUNTER — Other Ambulatory Visit: Payer: Self-pay | Admitting: Family Medicine

## 2018-10-14 NOTE — Telephone Encounter (Signed)
Last OV 07/23/2018 Last refill 05/28/2018 Next OV not scheduled

## 2018-10-16 ENCOUNTER — Telehealth: Payer: Self-pay | Admitting: *Deleted

## 2018-10-16 NOTE — Telephone Encounter (Signed)
Left message to call back regarding upcoming visit and making video visit

## 2018-10-18 NOTE — Telephone Encounter (Signed)
LMTCB to set up video visit and do pre-call. (OK per DPR for detailed message)

## 2018-10-22 NOTE — Telephone Encounter (Signed)
[facility] MESSAGE RECEIVED AS BELOW   Gregory Collins  to Skeet Latch, MD      10/21/18 12:28 PM  I have a visit on Wednesday. I will not be attending due to the virus and safety concerns. I am feeling fine. I have tried calling several times with no answer. I will plan to reschedule after the virus.   Be safe

## 2018-10-23 ENCOUNTER — Ambulatory Visit: Payer: BC Managed Care – PPO | Admitting: Cardiovascular Disease

## 2018-10-28 ENCOUNTER — Encounter: Payer: Self-pay | Admitting: Family Medicine

## 2018-10-29 ENCOUNTER — Encounter: Payer: Self-pay | Admitting: Internal Medicine

## 2018-11-29 ENCOUNTER — Other Ambulatory Visit: Payer: Self-pay | Admitting: Internal Medicine

## 2018-12-07 ENCOUNTER — Other Ambulatory Visit: Payer: Self-pay | Admitting: Allergy and Immunology

## 2018-12-18 ENCOUNTER — Other Ambulatory Visit: Payer: Self-pay

## 2018-12-18 ENCOUNTER — Encounter: Payer: Self-pay | Admitting: Family Medicine

## 2018-12-18 ENCOUNTER — Ambulatory Visit: Payer: BC Managed Care – PPO | Admitting: Family Medicine

## 2018-12-18 VITALS — BP 122/62 | HR 79 | Temp 97.6°F | Ht 67.0 in | Wt 182.8 lb

## 2018-12-18 DIAGNOSIS — E1159 Type 2 diabetes mellitus with other circulatory complications: Secondary | ICD-10-CM

## 2018-12-18 DIAGNOSIS — D510 Vitamin B12 deficiency anemia due to intrinsic factor deficiency: Secondary | ICD-10-CM

## 2018-12-18 DIAGNOSIS — E1169 Type 2 diabetes mellitus with other specified complication: Secondary | ICD-10-CM | POA: Diagnosis not present

## 2018-12-18 DIAGNOSIS — E785 Hyperlipidemia, unspecified: Secondary | ICD-10-CM | POA: Diagnosis not present

## 2018-12-18 DIAGNOSIS — E663 Overweight: Secondary | ICD-10-CM

## 2018-12-18 DIAGNOSIS — Z125 Encounter for screening for malignant neoplasm of prostate: Secondary | ICD-10-CM

## 2018-12-18 DIAGNOSIS — E119 Type 2 diabetes mellitus without complications: Secondary | ICD-10-CM

## 2018-12-18 DIAGNOSIS — I10 Essential (primary) hypertension: Secondary | ICD-10-CM

## 2018-12-18 LAB — LIPID PANEL
Cholesterol: 152 mg/dL (ref 0–200)
HDL: 32.8 mg/dL — ABNORMAL LOW (ref >39.00)
LDL Cholesterol: 81 mg/dL (ref 0–99)
NonHDL: 119.39
Total CHOL/HDL Ratio: 5
Triglycerides: 192 mg/dL — ABNORMAL HIGH (ref 0.0–149.0)
VLDL: 38.4 mg/dL (ref 0.0–40.0)

## 2018-12-18 LAB — COMPREHENSIVE METABOLIC PANEL
ALT: 15 U/L (ref 0–53)
AST: 15 U/L (ref 0–37)
Albumin: 4.4 g/dL (ref 3.5–5.2)
Alkaline Phosphatase: 66 U/L (ref 39–117)
BUN: 18 mg/dL (ref 6–23)
CO2: 28 mEq/L (ref 19–32)
Calcium: 8.7 mg/dL (ref 8.4–10.5)
Chloride: 100 mEq/L (ref 96–112)
Creatinine, Ser: 0.95 mg/dL (ref 0.40–1.50)
GFR: 103.05 mL/min (ref >60.00)
Glucose, Bld: 118 mg/dL — ABNORMAL HIGH (ref 70–99)
Potassium: 3.8 mEq/L (ref 3.5–5.1)
Sodium: 136 mEq/L (ref 135–145)
Total Bilirubin: 0.5 mg/dL (ref 0.2–1.2)
Total Protein: 7 g/dL (ref 6.0–8.3)

## 2018-12-18 LAB — CBC
HCT: 39.6 % (ref 39.0–52.0)
Hemoglobin: 13.2 g/dL (ref 13.0–17.0)
MCHC: 33.4 g/dL (ref 30.0–36.0)
MCV: 85.5 fl (ref 78.0–100.0)
Platelets: 193 10*3/uL (ref 150.0–400.0)
RBC: 4.63 Mil/uL (ref 4.22–5.81)
RDW: 14.4 % (ref 11.5–15.5)
WBC: 5.3 10*3/uL (ref 4.0–10.5)

## 2018-12-18 LAB — POCT URINALYSIS DIPSTICK
Bilirubin, UA: POSITIVE
Blood, UA: NEGATIVE
Glucose, UA: NEGATIVE
Ketones, UA: NEGATIVE
Leukocytes, UA: NEGATIVE
Nitrite, UA: NEGATIVE
Protein, UA: NEGATIVE
Spec Grav, UA: 1.025 (ref 1.010–1.025)
Urobilinogen, UA: 1 E.U./dL
pH, UA: 6 (ref 5.0–8.0)

## 2018-12-18 LAB — PSA: PSA: 0.71 ng/mL (ref 0.10–4.00)

## 2018-12-18 LAB — VITAMIN B12: Vitamin B-12: 208 pg/mL — ABNORMAL LOW (ref 211–911)

## 2018-12-18 LAB — HEMOGLOBIN A1C: Hgb A1c MFr Bld: 6.9 % — ABNORMAL HIGH (ref 4.6–6.5)

## 2018-12-18 NOTE — Patient Instructions (Addendum)
Health Maintenance Due  Topic Date Due  . PNEUMOCOCCAL POLYSACCHARIDE VACCINE AGE 47-64 HIGH RISK  - hold off today 06/20/1974  . TETANUS/TDAP - hold off today- if you get a cut or scrape you need this 06/21/1991  . OPHTHALMOLOGY EXAM - If you have had your eye exam within the last year, please sign release of information at check out desk. If you have not had an eye exam within a year, please get one at this time as this is important for your diabetes care  09/18/2018  . HEMOGLOBIN A1C - today with phlebotomy if he is agreeable 11/26/2018   Plantar fasciitis left foot -try to get some good supportive walking shoes -consider heel lift/gel cup -see handout for exercises try to do these at least 3x a week for a month then at least once a week after that - ice 2-3x a day for 20 minutes - if no better in 2 weeks or worsens- may need to pull back on walking for a week or two to allow it to heal  Please stop by lab before you go If you do not have mychart- we will call you about results within 5 business days of [place] receiving them.  If you have mychart- we will send your results within 3 business days of [place] receiving them.  If abnormal or we want to clarify a result, we will call or mychart you to make sure you receive the message.  If you have questions or concerns or don't hear within 5-7 days, please send [place] a message or call us.

## 2018-12-18 NOTE — Progress Notes (Signed)
Phone [phone]   Subjective:  Gregory Collins is a 48 y.o. year old very pleasant male patient who presents for/with See problem oriented charting Chief Complaint  Patient presents with  . Diabetes   ROS-  No fever, chills, cough, shortness of breath, body aches, sore throat, or loss of taste or smell. Has left heel pain  Past Medical History-  Patient Active Problem List   Diagnosis Date Noted  . Diabetes mellitus without complication ([place]) 09/38/1829    Priority: High  . Mild persistent asthma 08/03/2015    Priority: Medium  . Sinus tachycardia 06/29/2015    Priority: Medium  . Pernicious anemia 03/29/2015    Priority: Medium  . Hyperlipidemia 11/10/2014    Priority: Medium  . Essential hypertension 11/10/2014    Priority: Medium  . Gastroesophageal reflux disease 06/20/2011    Priority: Medium  . Erectile dysfunction 07/28/2016    Priority: Low  . Allergic rhinitis 11/16/2015    Priority: Low  . Migraine 08/20/2011    Priority: Low    Medications- reviewed and updated Current Outpatient Medications  Medication Sig Dispense Refill  . albuterol (VENTOLIN HFA) 108 (90 Base) MCG/ACT inhaler INHALE 2 PUFFS INTO THE LUNGS EVERY 4 (FOUR) HOURS AS NEEDED FOR WHEEZING OR SHORTNESS OF BREATH. 18 Inhaler 1  . aspirin EC 81 MG tablet Take by mouth.    Marland Kitchen atorvastatin (LIPITOR) 20 MG tablet TAKE 1 TABLET (20 MG TOTAL) DAILY BY MOUTH. 90 tablet 1  . cyanocobalamin (,VITAMIN B-12,) 1000 MCG/ML injection INJECT 1 ML (1,000 MCG TOTAL) INTO THE MUSCLE EVERY 30 (THIRTY) DAYS. 3 mL 1  . fluticasone (FLOVENT HFA) 44 MCG/ACT inhaler Inhale 2 puffs into the lungs 2 (two) times daily. 1 Inhaler 5  . lisinopril (PRINIVIL,ZESTRIL) 20 MG tablet TAKE 1 TABLET BY MOUTH EVERY DAY 90 tablet 3  . metFORMIN (GLUCOPHAGE) 500 MG tablet TAKE 1 TABLET BY MOUTH TWICE A DAY WITH A MEAL 180 tablet 1  . metoprolol tartrate (LOPRESSOR) 25 MG tablet TAKE 1 TABLET (25 MG TOTAL) BY MOUTH 2 (TWO) TIMES  DAILY. KEEP OV. 180 tablet 0  . pantoprazole (PROTONIX) 40 MG tablet TAKE 1 TABLET (40 MG TOTAL) BY MOUTH 2 TIMES DAILY.TAKE 30 MINUTES TO 1 HR BEFORE BREAKFAST & DINNER 180 tablet 0   No current facility-administered medications for this visit.      Objective:  BP 122/62   Pulse 79   Temp 97.6 F (36.4 C) (Oral)   Ht 5\' 7"  (1.702 m)   Wt 182 lb 12.8 oz (82.9 kg)   SpO2 98%   BMI 28.63 kg/m  Gen: NAD, resting comfortably CV: RRR no murmurs rubs or gallops Lungs: CTAB no crackles, wheeze, rhonchi Abdomen: soft/nontender Ext: no edema Skin: warm, dry MSk: tight achilles bilaterally. Pain at insertion point of plantar fascia on left foot. Foot exam otherwise normal.     Assessment and Plan   # Left Heel/Foot pain S:left foot pain at his heel. If he gets up at night or gets up in the morning- worst pain he has had. Started treadmill about 3 months ago. Heel pain started a week after. Shoes about a year old. No treatments tried. First step of AM about a 5/10. Sometimes when walking he can feel it but not always- can get up to 5/10. Achy type pain. No ice tried.   A/P: Plantar fasciitis left foot -try to get some good supportive walking shoes -consider heel lift/gel cup -see handout for exercises try  to do these at least 3x a week for a month then at least once a week after that - ice 2-3x a day for 20 minutes - if no better in 2 weeks or worsens- may need to pull back on walking for a week or two to allow it to heal   # Diabetes S:  controlled on last check on metformin 500mg  BID. Down 16 lbs from last visit- has been getting on treadmill every morning Wt Readings from Last 3 Encounters:  12/18/18 182 lb 12.8 oz (82.9 kg)  07/23/18 198 lb 9.6 oz (90.1 kg)  05/28/18 198 lb 3.2 oz (89.9 kg)   Lab Results  Component Value Date   HGBA1C 6.6 (A) 05/28/2018   HGBA1C 7.4 (H) 12/25/2017   HGBA1C 6.1 06/05/2017  A/P:  update labs today- hopefully controlled - I suspect it will be  with weight loss- may be able to go back to metformin once a day. j  Remains overweight but improving! Encouraged need for healthy eating, regular exercise, weight loss.   #hyperlipidemia S: reasonably controlled on atorvastatin 20mg  with last LDL under 100.   A/P: update full lipid panel.  Would prefer lower triglycerides as well- continue to work on weight loss  #hypertension S: controlled on lisinopril 20mg , metoprolol 25 mg BID BP Readings from Last 3 Encounters:  12/18/18 122/62  08/23/18 128/88  07/23/18 118/68  A/P: Stable. Continue current medications.   Future Appointments  Date Time Provider [place]  01/07/2019 10:40 AM Marin Olp, MD [facility]   Lab/Order associations: Diabetes mellitus without complication ([place]) - Plan: CBC, Comprehensive metabolic panel, Hemoglobin A1c, Lipid panel, CANCELED: LDL cholesterol, direct  Screening for prostate cancer - Plan: PSA  Hypertension associated with diabetes ([place])  Hyperlipidemia associated with type 2 diabetes mellitus ([place])  Pernicious anemia - Plan: Vitamin B12  No orders of the defined types were placed in this encounter.   Return precautions advised.  Garret Reddish, MD

## 2018-12-18 NOTE — Addendum Note (Signed)
Addended by: Gwenyth Ober R on: 12/18/2018 08:47 AM   Modules accepted: Orders

## 2018-12-18 NOTE — Addendum Note (Signed)
Addended by: Francis Dowse T on: 12/18/2018 08:35 AM   Modules accepted: Orders

## 2018-12-25 ENCOUNTER — Encounter: Payer: Self-pay | Admitting: Internal Medicine

## 2019-01-01 ENCOUNTER — Encounter: Payer: Self-pay | Admitting: Family Medicine

## 2019-01-02 ENCOUNTER — Other Ambulatory Visit: Payer: Self-pay

## 2019-01-02 ENCOUNTER — Other Ambulatory Visit: Payer: Self-pay | Admitting: Internal Medicine

## 2019-01-02 ENCOUNTER — Other Ambulatory Visit: Payer: Self-pay | Admitting: Cardiovascular Disease

## 2019-01-02 MED ORDER — CYANOCOBALAMIN 1000 MCG/ML IJ SOLN: 1000.0000 ug | INTRAMUSCULAR | 1 refills | Status: DC

## 2019-01-06 ENCOUNTER — Ambulatory Visit: Payer: BC Managed Care – PPO | Admitting: *Deleted

## 2019-01-06 ENCOUNTER — Other Ambulatory Visit: Payer: Self-pay

## 2019-01-06 VITALS — Ht 67.0 in | Wt 180.0 lb

## 2019-01-06 DIAGNOSIS — Z8719 Personal history of other diseases of the digestive system: Secondary | ICD-10-CM

## 2019-01-06 NOTE — Progress Notes (Signed)

## 2019-01-07 ENCOUNTER — Encounter: Payer: Self-pay | Admitting: Internal Medicine

## 2019-01-07 ENCOUNTER — Encounter: Payer: BC Managed Care – PPO | Admitting: Family Medicine

## 2019-01-17 ENCOUNTER — Telehealth: Payer: Self-pay | Admitting: Internal Medicine

## 2019-01-17 NOTE — Telephone Encounter (Signed)

## 2019-01-20 ENCOUNTER — Ambulatory Visit (AMBULATORY_SURGERY_CENTER): Payer: BC Managed Care – PPO | Admitting: Internal Medicine

## 2019-01-20 ENCOUNTER — Other Ambulatory Visit: Payer: Self-pay

## 2019-01-20 ENCOUNTER — Encounter: Payer: Self-pay | Admitting: Internal Medicine

## 2019-01-20 VITALS — BP 131/67 | HR 69 | Temp 98.3°F | Resp 17 | Ht 67.0 in | Wt 180.0 lb

## 2019-01-20 DIAGNOSIS — K2970 Gastritis, unspecified, without bleeding: Secondary | ICD-10-CM | POA: Diagnosis present

## 2019-01-20 DIAGNOSIS — K317 Polyp of stomach and duodenum: Secondary | ICD-10-CM

## 2019-01-20 DIAGNOSIS — K3189 Other diseases of stomach and duodenum: Secondary | ICD-10-CM | POA: Diagnosis not present

## 2019-01-20 DIAGNOSIS — K2940 Chronic atrophic gastritis without bleeding: Secondary | ICD-10-CM

## 2019-01-20 DIAGNOSIS — K2950 Unspecified chronic gastritis without bleeding: Secondary | ICD-10-CM

## 2019-01-20 MED ORDER — SODIUM CHLORIDE 0.9 % IV SOLN: 500.0000 mL | Freq: Once | INTRAVENOUS | Status: DC

## 2019-01-20 NOTE — Progress Notes (Signed)
Report given to PACU, vss 

## 2019-01-20 NOTE — Op Note (Signed)
[facility] Patient Name: Gregory Collins Procedure Date: 01/20/2019 9:33 AM MRN: [id number] Endoscopist: Jerene Bears , MD Age: 48 Referring MD:  Date of Birth: 01-07-1971 Gender: Male Account #: [id number] Procedure:                Upper GI endoscopy Indications:              Follow-up of atrophic gastritis, personal history                            of hyperplastic polyp in Sept 2016, last EGD Dec                            2017 Medicines:                Monitored Anesthesia Care Procedure:                Pre-Anesthesia Assessment:                           - Prior to the procedure, a History and Physical                            was performed, and patient medications and                            allergies were reviewed. The patient's tolerance of                            previous anesthesia was also reviewed. The risks                            and benefits of the procedure and the sedation                            options and risks were discussed with the patient.                            All questions were answered, and informed consent                            was obtained. Prior Anticoagulants: The patient has                            taken no previous anticoagulant or antiplatelet                            agents. ASA Grade Assessment: II - A patient with                            mild systemic disease. After reviewing the risks                            and benefits, the patient was deemed in  satisfactory condition to undergo the procedure.                           After obtaining informed consent, the endoscope was                            passed under direct vision. Throughout the                            procedure, the patient's blood pressure, pulse, and                            oxygen saturations were monitored continuously. The                            Endoscope was introduced through the mouth, and                        advanced to the second part of duodenum. The upper                            GI endoscopy was accomplished without difficulty.                            The patient tolerated the procedure well. Scope In: Scope Out: Findings:                 The examined esophagus was normal.                           A single 8 mm sessile polyp was found on the lesser                            curvature of the gastric body. The polyp was                            removed with a hot snare. Resection and retrieval                            were complete.                           Diffuse atrophic mucosa was found in the entire                            examined stomach. Biopsies were taken with a cold                            forceps for histology (Jar 2 = gastric antrum and                            incisura; Jar 3 = gastric body; Jar 4 = gastric  cardia and fundus).                           The examined duodenum was normal. Complications:            No immediate complications. Estimated Blood Loss:     Estimated blood loss was minimal, and none from                            polypectomy site Impression:               - Normal esophagus.                           - A single gastric polyp. Resected and retrieved.                           - Gastric mucosal atrophy. Biopsied.                           - Normal examined duodenum. Recommendation:           - Patient has a contact number available for                            emergencies. The signs and symptoms of potential                            delayed complications were discussed with the                            patient. Return to normal activities tomorrow.                            Written discharge instructions were provided to the                            patient.                           - Resume previous diet.                           - Continue present medications.                            - Await pathology results.                           - Repeat upper endoscopy after studies are complete                            for surveillance.                           - No aspirin, ibuprofen, naproxen, or other  non-steroidal anti-inflammatory drugs for 2 weeks                            after polyp removal. Jerene Bears, MD 01/20/2019 10:04:55 AM This report has been signed electronically.

## 2019-01-20 NOTE — Patient Instructions (Addendum)
Impression/Recommendations:  Resume previous diet. Continue present medications. Await pathology results.  Repeat upper endoscopy after studies are complete for surveillance.  No aspirin, ibuprofen, naproxen, or other NSAID drugs for 2 weeks.  Tylenol only until February 03, 2019.  YOU HAD AN ENDOSCOPIC PROCEDURE TODAY AT [facility]:   Refer to the procedure report that was given to you for any specific questions about what was found during the examination.  If the procedure report does not answer your questions, please call your gastroenterologist to clarify.  If you requested that your care partner not be given the details of your procedure findings, then the procedure report has been included in a sealed envelope for you to review at your convenience later.  YOU SHOULD EXPECT: Some feelings of bloating in the abdomen. Passage of more gas than usual.  Walking can help get rid of the air that was put into your GI tract during the procedure and reduce the bloating. If you had a lower endoscopy (such as a colonoscopy or flexible sigmoidoscopy) you may notice spotting of blood in your stool or on the toilet paper. If you underwent a bowel prep for your procedure, you may not have a normal bowel movement for a few days.  Please Note:  You might notice some irritation and congestion in your nose or some drainage.  This is from the oxygen used during your procedure.  There is no need for concern and it should clear up in a day or so.  SYMPTOMS TO REPORT IMMEDIATELY:   Following upper endoscopy (EGD)  Vomiting of blood or coffee ground material  New chest pain or pain under the shoulder blades  Painful or persistently difficult swallowing  New shortness of breath  Fever of 100F or higher  Black, tarry-looking stools  For urgent or emergent issues, a gastroenterologist can be reached at any hour by calling [phone].   DIET:  We do recommend a small meal at first, but then  you may proceed to your regular diet.  Drink plenty of fluids but you should avoid alcoholic beverages for 24 hours.  ACTIVITY:  You should plan to take it easy for the rest of today and you should NOT DRIVE or use heavy machinery until tomorrow (because of the sedation medicines used during the test).    FOLLOW UP: Our staff will call the number listed on your records 48-72 hours following your procedure to check on you and address any questions or concerns that you may have regarding the information given to you following your procedure. If we do not reach you, we will leave a message.  We will attempt to reach you two times.  During this call, we will ask if you have developed any symptoms of COVID 19. If you develop any symptoms (ie: fever, flu-like symptoms, shortness of breath, cough etc.) before then, please call [phone].  If you test positive for Covid 19 in the 2 weeks post procedure, please call and report this information to [place].    If any biopsies were taken you will be contacted by phone or by letter within the next 1-3 weeks.  Please call us at [phone] if you have not heard about the biopsies in 3 weeks.    SIGNATURES/CONFIDENTIALITY: You and/or your care partner have signed paperwork which will be entered into your electronic medical record.  These signatures attest to the fact that that the information above on your After Visit Summary has been reviewed and  is understood.  Full responsibility of the confidentiality of this discharge information lies with you and/or your care-partner.er endoscopy after studies are complete for surveillance

## 2019-01-20 NOTE — Progress Notes (Signed)
Called to room to assist during endoscopic procedure.  Patient ID and intended procedure confirmed with present staff. Received instructions for my participation in the procedure from the performing physician.  

## 2019-01-22 ENCOUNTER — Telehealth: Payer: Self-pay

## 2019-01-22 NOTE — Telephone Encounter (Signed)
  Follow up Call-  Call back number 01/20/2019 05/22/2017 06/14/2016  Post procedure Call Back phone  # [phone]  Permission to leave phone message Yes Yes Yes  Some recent data might be hidden     Patient questions:  Do you have a fever, pain , or abdominal swelling? No. Pain Score  0 *  Have you tolerated food without any problems? Yes.    Have you been able to return to your normal activities? Yes.    Do you have any questions about your discharge instructions: Diet   No. Medications  No. Follow up visit  No.  Do you have questions or concerns about your Care? No.  Actions: * If pain score is 4 or above: 1. No action needed, pain <4.Have you developed a fever since your procedure? no  2.   Have you had an respiratory symptoms (SOB or cough) since your procedure? no  3.   Have you tested positive for COVID 19 since your procedure no  4.   Have you had any family members/close contacts diagnosed with the COVID 19 since your procedure?  no   If yes to any of these questions please route to Joylene John, RN and Alphonsa Gin, Therapist, sports.

## 2019-01-27 ENCOUNTER — Encounter: Payer: Self-pay | Admitting: Internal Medicine

## 2019-01-29 ENCOUNTER — Other Ambulatory Visit: Payer: Self-pay | Admitting: Family Medicine

## 2019-02-23 ENCOUNTER — Other Ambulatory Visit: Payer: Self-pay | Admitting: Cardiovascular Disease

## 2019-03-13 ENCOUNTER — Encounter: Payer: Self-pay | Admitting: Family Medicine

## 2019-03-13 ENCOUNTER — Ambulatory Visit: Payer: Self-pay | Admitting: *Deleted

## 2019-03-13 ENCOUNTER — Telehealth: Payer: Self-pay | Admitting: Family Medicine

## 2019-03-13 NOTE — Telephone Encounter (Signed)
Noted  

## 2019-03-13 NOTE — Telephone Encounter (Signed)
[facility] for patient to come in for appointment.

## 2019-03-13 NOTE — Telephone Encounter (Signed)
Patient is requesting to come IN the office for appt tomorrow at 8:40, triaged for stomach pain, possible acid reflux, NO other symptoms though. Please advise if it is OK for pt to come in office for appt. Thank you!

## 2019-03-13 NOTE — Telephone Encounter (Signed)
I contacted the patient to update. He is aware and will see Dr. Jerline Pain tomorrow. No further action required.

## 2019-03-13 NOTE — Telephone Encounter (Signed)
Pt called in c/o middle upper stomach/abdominal pain.   "I think it's my acid reflux but I'm not sure".    "This has been hurting for a week".     "I've tried Tums and I'm taking Protonix".    The Tums may help a little but not much and I can't tell the Protonix is helping.  I warm transferred his call into Dr. Ansel Bong office to [place] to be scheduled.  I sent my notes to the office.  Reason for Disposition . [1] MODERATE pain (e.g., interferes with normal activities) AND [2] pain comes and goes (cramps) AND [3] present > 24 hours  (Exception: pain with Vomiting or Diarrhea - see that Guideline)  Answer Assessment - Initial Assessment Questions 1. LOCATION: "Where does it hurt?"      I have acid reflux.   The pain is in the upper middle part of my stomach. 2. RADIATION: "Does the pain shoot anywhere else?" (e.g., chest, back)     No 3. ONSET: "When did the pain begin?" (Minutes, hours or days ago)      A week ago 4. SUDDEN: "Gradual or sudden onset?"     Gradually 5. PATTERN "Does the pain come and go, or is it constant?"    - If constant: "Is it getting better, staying the same, or worsening?"      (Note: Constant means the pain never goes away completely; most serious pain is constant and it progresses)     - If intermittent: "How long does it last?" "Do you have pain now?"     (Note: Intermittent means the pain goes away completely between bouts)     Comes and goes 6. SEVERITY: "How bad is the pain?"  (e.g., Scale 1-10; mild, moderate, or severe)    - MILD (1-3): doesn't interfere with normal activities, abdomen soft and not tender to touch     - MODERATE (4-7): interferes with normal activities or awakens from sleep, tender to touch     - SEVERE (8-10): excruciating pain, doubled over, unable to do any normal activities       4 Moderate pain. 7. RECURRENT SYMPTOM: "Have you ever had this type of abdominal pain before?" If so, ask: "When was the last time?" and "What happened that  time?"      Yes.    I take Protonix.   It's not helping.  I've taken Tums helped sometimes. 8. CAUSE: "What do you think is causing the abdominal pain?"     Acid reflux 9. RELIEVING/AGGRAVATING FACTORS: "What makes it better or worse?" (e.g., movement, antacids, bowel movement)     I haven't paid much attention.  Eating may make it worse. 10. OTHER SYMPTOMS: "Has there been any vomiting, diarrhea, constipation, or urine problems?"       None of the above  Protocols used: ABDOMINAL PAIN - MALE-A-AH

## 2019-03-14 ENCOUNTER — Ambulatory Visit: Payer: BC Managed Care – PPO | Admitting: Family Medicine

## 2019-03-14 ENCOUNTER — Telehealth: Payer: Self-pay | Admitting: Cardiovascular Disease

## 2019-03-14 ENCOUNTER — Other Ambulatory Visit: Payer: Self-pay

## 2019-03-14 ENCOUNTER — Encounter: Payer: Self-pay | Admitting: Family Medicine

## 2019-03-14 VITALS — BP 110/68 | HR 74 | Temp 98.0°F | Ht 67.0 in | Wt 173.4 lb

## 2019-03-14 DIAGNOSIS — R1013 Epigastric pain: Secondary | ICD-10-CM | POA: Diagnosis not present

## 2019-03-14 DIAGNOSIS — K2950 Unspecified chronic gastritis without bleeding: Secondary | ICD-10-CM

## 2019-03-14 MED ORDER — SUCRALFATE 1 G PO TABS: 2.0000 g | ORAL_TABLET | Freq: Three times a day (TID) | ORAL | 0 refills | Status: DC

## 2019-03-14 NOTE — Telephone Encounter (Signed)
LVM for patient to call and schedule followup from wait list for opening on 03-18-19.

## 2019-03-14 NOTE — Progress Notes (Signed)
   Chief Complaint:  Gregory Collins is a 48 y.o. male who presents for same day appointment with a chief complaint of epigastric abdominal pain.   Assessment/Plan:  Epigastric Abdominal Pain Benign abdominal exam.  Recently had right upper quadrant ultrasound done with no gallstones and he has negative Murphy sign.  Likely has a mild gastritis flare.  Recommended he continue Protonix 40 mg twice daily.  Also recommended continuing simethicone and Tums as needed.  Recommended over-the-counter Pepto-Bismol as well.  We will also send in supply of Carafate to use over the next couple of days.  If no improvement, will need follow-up with GI.  Discussed reasons return to care and seek urgent care.     Subjective:  HPI:  Epigastric Abdominal Pain Started about a week to a week and a half ago.  He is having abdominal pain in his upper abdomen into his epigastric area.  Sometimes worse after eating.  No nausea or vomiting.  He has a history of chronic gastritis.  Underwent EGD about a month ago which was reportedly normal.  He is currently taking Protonix but is not sure if it is helping.  Is tried taking Gas-X and Tums with modest improvement.  Symptoms may be worse when lying down.  No constipation or diarrhea.  No melena or hematochezia.  No changes in diet.  No obvious precipitating events.  No other treatments tried.  No other obvious aggravating or alleviating factors.  ROS: Per HPI  PMH: He reports that he has never smoked. He has never used smokeless tobacco. He reports current alcohol use of about 3.0 - 4.0 standard drinks of alcohol per week. He reports that he does not use drugs.      Objective:  Physical Exam: BP 110/68   Pulse 74   Temp 98 F (36.7 C)   Ht 5\' 7"  (1.702 m)   Wt 173 lb 6.1 oz (78.6 kg)   SpO2 95%   BMI 27.16 kg/m   Wt Readings from Last 3 Encounters:  03/14/19 173 lb 6.1 oz (78.6 kg)  01/20/19 180 lb (81.6 kg)  01/06/19 180 lb (81.6 kg)  Gen: NAD, resting  comfortably CV: Regular rate and rhythm with no murmurs appreciated Pulm: Normal work of breathing, clear to auscultation bilaterally with no crackles, wheezes, or rhonchi GI: Normal bowel sounds present. Soft, Nondistended.  Mildly tender to palpation superior to umbilicus.  Murphy sign negative.      Algis Greenhouse. Jerline Pain, MD 03/14/2019 9:20 AM

## 2019-03-14 NOTE — Patient Instructions (Signed)
It was very nice to see you today!  I think you are having a gastritis flare.  Please start the Carafate.  Please continue taking Protonix twice daily.  You can continue using Gas-X.  Can also use Pepto-Bismol 3-4 times per day.  Please let me know if your symptoms worsen or do not improve in the next few days.  Take care, Dr Jerline Pain

## 2019-03-24 ENCOUNTER — Telehealth: Payer: Self-pay | Admitting: Family Medicine

## 2019-03-24 NOTE — Telephone Encounter (Signed)
See note

## 2019-03-24 NOTE — Telephone Encounter (Signed)
Patient is calling to see if he can get a sooner appt than 05/05/2019 with Dr. Yong Channel. The patient is following up on stomach pain that he saw Dr. Jerline Pain about. The pain is still there. However, there are no available appts until 05/05/2019. Can the Patient be seen sooner. Please advise CB- [phone]

## 2019-03-24 NOTE — Telephone Encounter (Signed)
Looks like pt has been r/s for 03/31/19

## 2019-03-26 ENCOUNTER — Other Ambulatory Visit: Payer: Self-pay | Admitting: Internal Medicine

## 2019-03-31 ENCOUNTER — Other Ambulatory Visit: Payer: Self-pay

## 2019-03-31 ENCOUNTER — Encounter: Payer: Self-pay | Admitting: Family Medicine

## 2019-03-31 ENCOUNTER — Ambulatory Visit: Payer: BC Managed Care – PPO | Admitting: Family Medicine

## 2019-03-31 VITALS — BP 114/70 | HR 68 | Temp 98.4°F | Ht 67.0 in | Wt 176.0 lb

## 2019-03-31 DIAGNOSIS — I10 Essential (primary) hypertension: Secondary | ICD-10-CM | POA: Diagnosis not present

## 2019-03-31 DIAGNOSIS — Z23 Encounter for immunization: Secondary | ICD-10-CM

## 2019-03-31 DIAGNOSIS — E119 Type 2 diabetes mellitus without complications: Secondary | ICD-10-CM

## 2019-03-31 DIAGNOSIS — D510 Vitamin B12 deficiency anemia due to intrinsic factor deficiency: Secondary | ICD-10-CM

## 2019-03-31 DIAGNOSIS — K219 Gastro-esophageal reflux disease without esophagitis: Secondary | ICD-10-CM

## 2019-03-31 LAB — HEMOGLOBIN A1C: Hgb A1c MFr Bld: 6.6 % — ABNORMAL HIGH (ref 4.6–6.5)

## 2019-03-31 LAB — VITAMIN B12: Vitamin B-12: 532 pg/mL (ref 211–911)

## 2019-03-31 NOTE — Patient Instructions (Addendum)
Sign release of information at the check out desk for last diabetic eye exam at the desk- sorry we didn't get it from last time yet  We will call you within two weeks about your referral to Dr. Hilarie Fredrickson for sit down- if they offer anything other than sit down- please decline and press for the visit. If you do not hear within 3 weeks, give [place] a call.   Please stop by lab before you go If you do not have mychart- we will call you about results within 5 business days of [place] receiving them.  If you have mychart- we will send your results within 3 business days of [place] receiving them.  If abnormal or we want to clarify a result, we will call or mychart you to make sure you receive the message.  If you have questions or concerns or don't hear within 5-7 days, please send [place] a message or call us.

## 2019-03-31 NOTE — Progress Notes (Signed)
Phone [phone]   Subjective:  Gregory Collins is a 48 y.o. year old very pleasant male patient who presents for/with See problem oriented charting Chief Complaint  Patient presents with  . Follow-up  . gastritis  . epigastric abd pain   ROS- no fever/chills/nausea. Some bloating. Some epigastric discomfort at times.    Past Medical History-  Patient Active Problem List   Diagnosis Date Noted  . Diabetes mellitus without complication ([place]) XX123456    Priority: High  . Mild persistent asthma 08/03/2015    Priority: Medium  . Sinus tachycardia 06/29/2015    Priority: Medium  . Pernicious anemia 03/29/2015    Priority: Medium  . Hyperlipidemia 11/10/2014    Priority: Medium  . Essential hypertension 11/10/2014    Priority: Medium  . Gastroesophageal reflux disease 06/20/2011    Priority: Medium  . Erectile dysfunction 07/28/2016    Priority: Low  . Allergic rhinitis 11/16/2015    Priority: Low  . Migraine 08/20/2011    Priority: Low    Medications- reviewed and updated Current Outpatient Medications  Medication Sig Dispense Refill  . albuterol (VENTOLIN HFA) 108 (90 Base) MCG/ACT inhaler INHALE 2 PUFFS INTO THE LUNGS EVERY 4 (FOUR) HOURS AS NEEDED FOR WHEEZING OR SHORTNESS OF BREATH. 18 Inhaler 1  . aspirin EC 81 MG tablet Take by mouth.    Marland Kitchen atorvastatin (LIPITOR) 20 MG tablet TAKE 1 TABLET (20 MG TOTAL) DAILY BY MOUTH. 90 tablet 1  . cyanocobalamin (,VITAMIN B-12,) 1000 MCG/ML injection Inject 1 mL (1,000 mcg total) into the muscle every 30 (thirty) days. 3 mL 1  . fluticasone (FLOVENT HFA) 44 MCG/ACT inhaler Inhale 2 puffs into the lungs 2 (two) times daily. 1 Inhaler 5  . lisinopril (PRINIVIL,ZESTRIL) 20 MG tablet TAKE 1 TABLET BY MOUTH EVERY DAY 90 tablet 3  . metFORMIN (GLUCOPHAGE) 500 MG tablet TAKE 1 TABLET BY MOUTH TWICE A DAY WITH A MEAL 180 tablet 1  . metoprolol tartrate (LOPRESSOR) 25 MG tablet Take 1 tablet (25 mg total) by mouth 2 (two) times  daily. NEED OV. 180 tablet 0  . pantoprazole (PROTONIX) 40 MG tablet TAKE 1 TABLET (40 MG TOTAL) BY MOUTH 2 TIMES DAILY.TAKE 30 MINUTES TO 1 HR BEFORE BREAKFAST & DINNER 180 tablet 1  . sucralfate (CARAFATE) 1 g tablet Take 2 tablets (2 g total) by mouth 4 (four) times daily -  with meals and at bedtime. 240 tablet 0   No current facility-administered medications for this visit.      Objective:  BP 114/70   Pulse 68   Temp 98.4 F (36.9 C)   Ht 5\' 7"  (1.702 m)   Wt 176 lb (79.8 kg)   SpO2 97%   BMI 27.57 kg/m  Gen: NAD, resting comfortably CV: RRR no murmurs rubs or gallops Lungs: CTAB no crackles, wheeze, rhonchi Abdomen: soft/nontender- some sensation of need to urinate with suprapubic pain/mild discomfort/nondistended/normal bowel sounds. No rebound or guarding.  Ext: no edema Skin: warm, dry Neuro: grossly normal, moves all extremities     Assessment and Plan   # Epigastric Abd Pain/atrophic gastritis S:Feels like symptoms started after endoscopy.  Patient is compliant with twice daily PPI seen by Dr. Jerline Pain 03/14/19 and dx with gastritis, prescribed Sucralfate.Took it for about a week but noted loose stools with it so pulled back off- urgency of needing to go to bathroom. When stopped medicine, urgency and loose stools resolved. Had felt like stomach had goten a little better but then  reverted back to about 60-70% lower than worst of symptoms- at present having symptoms that come and go- bloating after eating, mild vague discomf in upper abdomen.    Pt states he is feeling better but he still feels like something is going on in his stomach. Pt denies nausea and vomiting, constipation and diarrhea and his appetite is normal. A/P: After discussion today-patient states primarily he would like to follow-up with Dr. Hilarie Fredrickson to discuss his concerns in person and develop a long-term plan.  He would like to avoid being on twice daily PPI long-term due to potential health risks.  Since his  symptoms seem to worsen after EGD patient would like to directly asked Dr. Hilarie Fredrickson if  questions such as could removal of polyp because his symptoms-I told patient I did not think so.  For now we will continue twice daily PPI until we get Dr. Vena Rua opinion  #hypertension S: controlled on repeat on lisinopril 20 mg BP Readings from Last 3 Encounters:  03/31/19 114/70  03/14/19 110/68  01/20/19 131/67  A/P:  Stable. Continue current medications.    # overweight S:trying to walk 6 miles a day most days. Diet much improved. Down 22 lbs over this year.   A/P: Congratulated patient on his excellent efforts -Encouraged need for continued healthy eating, regular exercise, weight loss.   # Diabetes S: Reasonably controlled on metformin 500 mg twice daily Lab Results  Component Value Date   HGBA1C 6.6 (H) 03/31/2019   HGBA1C 6.9 (H) 12/18/2018   HGBA1C 6.6 (A) 05/28/2018   A/P: Good control on metformin-continue current medication  #Pernicious anemia S: Patient complaining B12 injections once a month  Lab Results  Component Value Date   [id number] 03/31/2019   A/P:  Stable. Continue current medications with monthly injections  Recommended follow up: 4 to 84-month follow-up for physical  Lab/Order associations:   ICD-10-CM   1. Pernicious anemia  D51.0 Vitamin B12  2. Gastroesophageal reflux disease, esophagitis presence not specified  K21.9 Ambulatory referral to Gastroenterology  3. Diabetes mellitus without complication (HCC)  [id number] Hemoglobin A1c  4. Need for immunization against influenza  Z23 Flu Vaccine QUAD 36+ mos IM   Return precautions advised.  Garret Reddish, MD

## 2019-04-05 ENCOUNTER — Other Ambulatory Visit: Payer: Self-pay | Admitting: Family Medicine

## 2019-05-02 ENCOUNTER — Encounter: Payer: Self-pay | Admitting: *Deleted

## 2019-05-06 ENCOUNTER — Other Ambulatory Visit: Payer: Self-pay | Admitting: Family Medicine

## 2019-05-07 ENCOUNTER — Other Ambulatory Visit: Payer: Self-pay | Admitting: Family Medicine

## 2019-05-09 ENCOUNTER — Encounter: Payer: Self-pay | Admitting: Family Medicine

## 2019-05-12 ENCOUNTER — Ambulatory Visit: Payer: BC Managed Care – PPO | Admitting: Internal Medicine

## 2019-05-12 ENCOUNTER — Encounter: Payer: Self-pay | Admitting: Internal Medicine

## 2019-05-12 VITALS — BP 128/88 | HR 66 | Temp 97.9°F | Ht 67.0 in | Wt 176.8 lb

## 2019-05-12 DIAGNOSIS — R1013 Epigastric pain: Secondary | ICD-10-CM

## 2019-05-12 DIAGNOSIS — K2940 Chronic atrophic gastritis without bleeding: Secondary | ICD-10-CM | POA: Diagnosis not present

## 2019-05-12 DIAGNOSIS — E538 Deficiency of other specified B group vitamins: Secondary | ICD-10-CM

## 2019-05-12 MED ORDER — METFORMIN HCL 500 MG PO TABS: ORAL_TABLET | ORAL | 1 refills | Status: DC

## 2019-05-12 MED ORDER — FAMOTIDINE 20 MG PO TABS: 20.0000 mg | ORAL_TABLET | Freq: Two times a day (BID) | ORAL | 3 refills | Status: DC

## 2019-05-12 NOTE — Patient Instructions (Signed)
Discontinue pantoprazole.  We have sent the following medications to your pharmacy for you to pick up at your convenience: Famotidine 20 mg every 12 hours   Continue b12.  Please follow up with Dr Hilarie Fredrickson in 3 months.  If you are age 47 or older, your body mass index should be between 23-30. Your Body mass index is 27.69 kg/m. If this is out of the aforementioned range listed, please consider follow up with your Primary Care Provider.  If you are age 48 or younger, your body mass index should be between 19-25. Your Body mass index is 27.69 kg/m. If this is out of the aformentioned range listed, please consider follow up with your Primary Care Provider.

## 2019-05-12 NOTE — Progress Notes (Signed)
Subjective:    Patient ID: Gregory Silver, PhD, male    DOB: December 30, 1970, 48 y.o.   MRN: [id number]  HPI Gregory Collins is a 48 year old male with a history of pernicious anemia, atrophic gastritis with metaplasia, hyperplastic gastric polyp, B12 deficiency, hypertension, hyperlipidemia and diabetes who is here for follow-up.  He is here alone today.  I last saw him at the time of his upper endoscopy performed on 01/20/2019.  His esophagus was normal.  There was an 8 mm sessile polyp on the lesser curve of the gastric body which was removed with hot snare.  There was diffuse atrophic mucosa in the entire stomach and biopsies were performed in topographic mapping fashion.  Examined duodenum was normal.  Pathology results revealed the polyp to be hyperplastic with erosion and inflammation.  No H. pylori.  No metaplasia or dysplasia.  Antral biopsies mild chronic gastritis.  Negative for H. pylori.  No plan aplasia or dysplasia.  Gastric body biopsies with chronic atrophic gastritis with intestinal metaplasia.  No H. pylori.  No dysplasia.  Gastric cardia and fundus biopsies chronic mildly active atrophic gastritis with focal intestinal metaplasia.  No H. pylori.  No dysplasia.  He reports that he has been having on and off epigastric abdominal pain described as a dull aching.  Can be present in the morning with waking at other times he noticed it while he sitting.  Wonders if it may be slightly better after bowel movement.  Sometimes it slightly better with eating.  His bowel movements for him to been fairly regular occurring 3-4 times per day.  He has been intentionally eating a more healthy diet and exercising and with this is lost 20 pounds.  His stools can be solid to slightly loose.  No blood in his stool or melena.  Of note his primary provider tried Carafate which he took in tablet form 4 times daily and this did not help his epigastric discomfort in fact it made it slightly worse.  He was  previously on pantoprazole 40 mg twice daily but of late he has been taking this once per day.  He did have cross-sectional imaging last year which was normal/reassuring.   Review of Systems As per HPI, otherwise negative  Current Medications, Allergies, Past Medical History, Past Surgical History, Family History and Social History were reviewed in [employer] record.     Objective:   Physical Exam BP 128/88 (BP Location: Left Arm, Patient Position: Sitting)   Pulse 66   Temp 97.9 F (36.6 C)   Ht 5\' 7"  (1.702 m)   Wt 176 lb 12.8 oz (80.2 kg)   SpO2 97%   BMI 27.69 kg/m  Gen: awake, alert, NAD HEENT: anicteric, op clear CV: RRR, no mrg Pulm: CTA b/l Abd: soft, mild epigastric tenderness without rebound or guarding, ND, +BS throughout Ext: no c/c/e Neuro: nonfocal  CBC    Component Value Date/Time   WBC 5.3 12/18/2018 0835   RBC 4.63 12/18/2018 0835   HGB 13.2 12/18/2018 0835   HCT 39.6 12/18/2018 0835   PLT 193.0 12/18/2018 0835   MCV 85.5 12/18/2018 0835   MCV 110.7 (A) 11/10/2014 1549   MCH 29.0 08/25/2015 1316   MCHC 33.4 12/18/2018 0835   RDW 14.4 12/18/2018 0835   LYMPHSABS 1.7 11/01/2016 1118   MONOABS 0.5 11/01/2016 1118   EOSABS 0.5 11/01/2016 1118   BASOSABS 0.0 11/01/2016 1118   CMP     Component Value Date/Time  NA 136 12/18/2018 0835   K 3.8 12/18/2018 0835   CL 100 12/18/2018 0835   CO2 28 12/18/2018 0835   GLUCOSE 118 (H) 12/18/2018 0835   BUN 18 12/18/2018 0835   CREATININE 0.95 12/18/2018 0835   CREATININE 1.00 08/25/2015 1316   CALCIUM 8.7 12/18/2018 0835   PROT 7.0 12/18/2018 0835   ALBUMIN 4.4 12/18/2018 0835   AST 15 12/18/2018 0835   ALT 15 12/18/2018 0835   ALKPHOS 66 12/18/2018 0835   BILITOT 0.5 12/18/2018 0835   GFRNONAA >89 11/12/2013 1623   GFRAA >89 11/12/2013 1623   Lab Results  Component Value Date   [id number] 03/31/2019   CT ABDOMEN WITH CONTRAST   TECHNIQUE: Multidetector CT  imaging of the abdomen was performed using the standard protocol following bolus administration of intravenous contrast.   CONTRAST:  171mL ISOVUE-300 IOPAMIDOL (ISOVUE-300) INJECTION 61%   COMPARISON:  None.   FINDINGS: Lower chest: The lung bases are clear of acute process. No pleural effusion or pulmonary lesions. The heart is normal in size. No pericardial effusion. The distal esophagus and aorta are unremarkable.   Hepatobiliary: No focal hepatic lesions or intrahepatic biliary dilatation. The gallbladder is normal. No common bile duct dilatation.   Pancreas: No mass, inflammation or ductal dilatation.   Spleen: Normal size.  No focal lesions.   Adrenals/Urinary Tract: The adrenal glands and kidneys are normal. No renal calculi or hydronephrosis.   Stomach/Bowel: The stomach, duodenum, visualized small bowel and visualized colon are unremarkable. No acute inflammatory changes, mass lesions or obstructive findings. The terminal ileum is normal. The appendix is normal.   Vascular/Lymphatic: Scattered age advanced distal aortic calcifications. The branch vessels are patent. The major venous structures are patent. No mesenteric or retroperitoneal mass or adenopathy.   Other: No ascites or abdominal wall hernia.   Musculoskeletal: No significant bony findings.   IMPRESSION: Unremarkable CT examination of the abdomen. No acute abdominal findings, mass lesions or adenopathy.     Electronically Signed   By: Marijo Sanes M.D.   On: 10/12/2017 16:26       Assessment & Plan:  48 year old male with a history of pernicious anemia, atrophic gastritis with metaplasia, hyperplastic gastric polyp, B12 deficiency, hypertension, hyperlipidemia and diabetes who is here for follow-up.  1.  Epigastric pain/history of hyperplastic gastric polyp/chronic atrophic gastritis with intestinal metaplasia --his epigastric pain has persisted though it is intermittent.  He had stable  findings at recent endoscopy without evidence of ulceration.  He is on once a day PPI.  His cross-sectional imaging from 18 months ago was also reassuring.  We discussed this at length today.  I would like to try him on H2 blocker therapy just to see if PPI could be contributing to some of his symptoms.  We discussed this at length today and I would like to see him in 3 months for follow-up, though I asked that he notify me sooner if not improving --Discontinue pantoprazole --Begin famotidine 20 mg roughly every 12 hours --Follow-up in 3 months --He prefers a shorter surveillance interval for upper endoscopy than 3 years which we previously discussed and I have recommended.  I understand his feelings and we discussed how guidelines do not give [place] a clear indication regarding surveillance endoscopy.  1 to 2 years is reasonable.  2.  History of adenomatous colon polyps --colonoscopy performed November 2018, recall was 5 years  25 minutes spent with the patient today. Greater than 50% was spent in counseling  and coordination of care with the patient

## 2019-05-25 ENCOUNTER — Other Ambulatory Visit: Payer: Self-pay | Admitting: Cardiovascular Disease

## 2019-05-28 ENCOUNTER — Other Ambulatory Visit: Payer: Self-pay

## 2019-05-28 ENCOUNTER — Encounter: Payer: Self-pay | Admitting: Family Medicine

## 2019-05-28 DIAGNOSIS — E119 Type 2 diabetes mellitus without complications: Secondary | ICD-10-CM

## 2019-05-29 ENCOUNTER — Other Ambulatory Visit: Payer: Self-pay

## 2019-05-29 ENCOUNTER — Other Ambulatory Visit (INDEPENDENT_AMBULATORY_CARE_PROVIDER_SITE_OTHER): Payer: BC Managed Care – PPO

## 2019-05-29 DIAGNOSIS — E119 Type 2 diabetes mellitus without complications: Secondary | ICD-10-CM | POA: Diagnosis not present

## 2019-05-29 DIAGNOSIS — Z125 Encounter for screening for malignant neoplasm of prostate: Secondary | ICD-10-CM | POA: Diagnosis not present

## 2019-05-29 LAB — COMPREHENSIVE METABOLIC PANEL
ALT: 21 U/L (ref 0–53)
AST: 13 U/L (ref 0–37)
Albumin: 4.6 g/dL (ref 3.5–5.2)
Alkaline Phosphatase: 59 U/L (ref 39–117)
BUN: 20 mg/dL (ref 6–23)
CO2: 29 mEq/L (ref 19–32)
Calcium: 9.2 mg/dL (ref 8.4–10.5)
Chloride: 100 mEq/L (ref 96–112)
Creatinine, Ser: 0.91 mg/dL (ref 0.40–1.50)
GFR: 108.09 mL/min (ref >60.00)
Glucose, Bld: 134 mg/dL — ABNORMAL HIGH (ref 70–99)
Potassium: 4.3 mEq/L (ref 3.5–5.1)
Sodium: 138 mEq/L (ref 135–145)
Total Bilirubin: 0.4 mg/dL (ref 0.2–1.2)
Total Protein: 7.3 g/dL (ref 6.0–8.3)

## 2019-05-29 LAB — CBC WITH DIFFERENTIAL/PLATELET
Basophils Absolute: 0.1 10*3/uL (ref 0.0–0.1)
Basophils Relative: 0.8 % (ref 0.0–3.0)
Eosinophils Absolute: 0.7 10*3/uL (ref 0.0–0.7)
Eosinophils Relative: 12 % — ABNORMAL HIGH (ref 0.0–5.0)
HCT: 41.9 % (ref 39.0–52.0)
Hemoglobin: 14 g/dL (ref 13.0–17.0)
Lymphocytes Relative: 33.9 % (ref 12.0–46.0)
Lymphs Abs: 2.1 10*3/uL (ref 0.7–4.0)
MCHC: 33.5 g/dL (ref 30.0–36.0)
MCV: 86.2 fl (ref 78.0–100.0)
Monocytes Absolute: 0.5 10*3/uL (ref 0.1–1.0)
Monocytes Relative: 7.7 % (ref 3.0–12.0)
Neutro Abs: 2.8 10*3/uL (ref 1.4–7.7)
Neutrophils Relative %: 45.6 % (ref 43.0–77.0)
Platelets: 209 10*3/uL (ref 150.0–400.0)
RBC: 4.87 Mil/uL (ref 4.22–5.81)
RDW: 14.5 % (ref 11.5–15.5)
WBC: 6.2 10*3/uL (ref 4.0–10.5)

## 2019-05-30 LAB — PSA: PSA: 0.42 ng/mL (ref 0.10–4.00)

## 2019-05-30 NOTE — Addendum Note (Signed)
Addended by: Francis Dowse T on: 05/30/2019 08:00 AM   Modules accepted: Orders

## 2019-06-22 ENCOUNTER — Other Ambulatory Visit: Payer: Self-pay | Admitting: Cardiovascular Disease

## 2019-06-24 NOTE — Telephone Encounter (Signed)
Rx has been sent to the pharmacy electronically. ° °

## 2019-06-25 ENCOUNTER — Other Ambulatory Visit: Payer: Self-pay | Admitting: Family Medicine

## 2019-07-10 ENCOUNTER — Other Ambulatory Visit: Payer: Self-pay

## 2019-07-13 ENCOUNTER — Other Ambulatory Visit: Payer: Self-pay | Admitting: Family Medicine

## 2019-07-14 ENCOUNTER — Encounter: Payer: Self-pay | Admitting: Family Medicine

## 2019-07-16 ENCOUNTER — Other Ambulatory Visit: Payer: Self-pay

## 2019-07-16 MED ORDER — METOPROLOL TARTRATE 25 MG PO TABS: ORAL_TABLET | ORAL | 0 refills | Status: DC

## 2019-07-23 ENCOUNTER — Other Ambulatory Visit: Payer: Self-pay

## 2019-07-23 MED ORDER — FAMOTIDINE 40 MG PO TABS: 40.0000 mg | ORAL_TABLET | Freq: Two times a day (BID) | ORAL | 3 refills | Status: DC

## 2019-07-25 ENCOUNTER — Other Ambulatory Visit: Payer: Self-pay

## 2019-08-01 ENCOUNTER — Other Ambulatory Visit: Payer: Self-pay | Admitting: Family Medicine

## 2019-08-02 ENCOUNTER — Other Ambulatory Visit: Payer: Self-pay | Admitting: Internal Medicine

## 2019-09-04 ENCOUNTER — Other Ambulatory Visit: Payer: Self-pay

## 2019-09-04 ENCOUNTER — Encounter: Payer: Self-pay | Admitting: Family Medicine

## 2019-09-04 MED ORDER — FLOVENT HFA 44 MCG/ACT IN AERO: 2.0000 | INHALATION_SPRAY | Freq: Two times a day (BID) | RESPIRATORY_TRACT | 5 refills | Status: DC

## 2019-09-22 ENCOUNTER — Ambulatory Visit: Payer: BC Managed Care – PPO | Admitting: Family Medicine

## 2019-09-22 ENCOUNTER — Other Ambulatory Visit: Payer: Self-pay

## 2019-09-22 ENCOUNTER — Encounter: Payer: Self-pay | Admitting: Family Medicine

## 2019-09-22 VITALS — BP 122/70 | HR 97 | Temp 98.2°F | Ht 67.0 in | Wt 188.2 lb

## 2019-09-22 DIAGNOSIS — E119 Type 2 diabetes mellitus without complications: Secondary | ICD-10-CM

## 2019-09-22 DIAGNOSIS — E1159 Type 2 diabetes mellitus with other circulatory complications: Secondary | ICD-10-CM

## 2019-09-22 DIAGNOSIS — E1169 Type 2 diabetes mellitus with other specified complication: Secondary | ICD-10-CM | POA: Diagnosis not present

## 2019-09-22 DIAGNOSIS — D510 Vitamin B12 deficiency anemia due to intrinsic factor deficiency: Secondary | ICD-10-CM

## 2019-09-22 DIAGNOSIS — Z711 Person with feared health complaint in whom no diagnosis is made: Secondary | ICD-10-CM

## 2019-09-22 DIAGNOSIS — E785 Hyperlipidemia, unspecified: Secondary | ICD-10-CM

## 2019-09-22 DIAGNOSIS — I10 Essential (primary) hypertension: Secondary | ICD-10-CM

## 2019-09-22 LAB — COMPREHENSIVE METABOLIC PANEL
ALT: 19 U/L (ref 0–53)
AST: 17 U/L (ref 0–37)
Albumin: 4.6 g/dL (ref 3.5–5.2)
Alkaline Phosphatase: 67 U/L (ref 39–117)
BUN: 14 mg/dL (ref 6–23)
CO2: 27 mEq/L (ref 19–32)
Calcium: 9.3 mg/dL (ref 8.4–10.5)
Chloride: 99 mEq/L (ref 96–112)
Creatinine, Ser: 0.94 mg/dL (ref 0.40–1.50)
GFR: 103.97 mL/min (ref >60.00)
Glucose, Bld: 112 mg/dL — ABNORMAL HIGH (ref 70–99)
Potassium: 4.2 mEq/L (ref 3.5–5.1)
Sodium: 136 mEq/L (ref 135–145)
Total Bilirubin: 0.5 mg/dL (ref 0.2–1.2)
Total Protein: 7.6 g/dL (ref 6.0–8.3)

## 2019-09-22 LAB — LDL CHOLESTEROL, DIRECT: Direct LDL: 110 mg/dL

## 2019-09-22 LAB — HEMOGLOBIN A1C: Hgb A1c MFr Bld: 7 % — ABNORMAL HIGH (ref 4.6–6.5)

## 2019-09-22 LAB — VITAMIN B12: Vitamin B-12: 293 pg/mL (ref 211–911)

## 2019-09-22 NOTE — Patient Instructions (Addendum)
Health Maintenance Due  Topic Date Due  . OPHTHALMOLOGY EXAM - please get this updated and have them send [place] a copy 09/18/2018  . FOOT EXAM - declines for today- plan for next visit 12/26/2018   Please stop by lab before you go If you do not have mychart- we will call you about results within 5 business days of [place] receiving them.  If you have mychart- we will send your results within 3 business days of [place] receiving them.  If abnormal or we want to clarify a result, we will call or mychart you to make sure you receive the message.  If you have questions or concerns or don't hear within 5 business days, please send [place] a message or call us.   Recommended follow up: Return in about 14 weeks (around 12/29/2019) for physical or sooner if needed.

## 2019-09-22 NOTE — Progress Notes (Signed)
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 spot in mouth    better seen by dentilst    This visit occurred during the SARS-CoV-2 public health emergency.  Safety protocols were in place, including screening questions prior to the visit, additional usage of staff PPE, and extensive cleaning of exam room while observing appropriate contact time as indicated for disinfecting solutions.   Past Medical History-  Patient Active Problem List   Diagnosis Date Noted  . Diabetes mellitus without complication ([place]) XX123456    Priority: High  . Mild persistent asthma 08/03/2015    Priority: Medium  . Sinus tachycardia 06/29/2015    Priority: Medium  . Pernicious anemia 03/29/2015    Priority: Medium  . Hyperlipidemia associated with type 2 diabetes mellitus ([place]) 11/10/2014    Priority: Medium  . Hypertension associated with diabetes ([place]) 11/10/2014    Priority: Medium  . Gastroesophageal reflux disease 06/20/2011    Priority: Medium  . Erectile dysfunction 07/28/2016    Priority: Low  . Allergic rhinitis 11/16/2015    Priority: Low  . Migraine 08/20/2011    Priority: Low    Medications- reviewed and updated Current Outpatient Medications  Medication Sig Dispense Refill  . albuterol (VENTOLIN HFA) 108 (90 Base) MCG/ACT inhaler INHALE 2 PUFFS INTO THE LUNGS EVERY 4 (FOUR) HOURS AS NEEDED FOR WHEEZING OR SHORTNESS OF BREATH. 18 Inhaler 1  . aspirin EC 81 MG tablet Take by mouth.    Marland Kitchen atorvastatin (LIPITOR) 20 MG tablet TAKE 1 TABLET (20 MG TOTAL) DAILY BY MOUTH. 90 tablet 1  . cyanocobalamin (,VITAMIN B-12,) 1000 MCG/ML injection INJECT 1 ML (1,000 MCG TOTAL) INTO THE MUSCLE EVERY 30 (THIRTY) DAYS. 3 mL 1  . famotidine (PEPCID) 40 MG tablet Take 1 tablet (40 mg total) by mouth 2 (two) times daily. 60  tablet 3  . fluticasone (FLOVENT HFA) 44 MCG/ACT inhaler Inhale 2 puffs into the lungs 2 (two) times daily. 1 Inhaler 5  . lisinopril (ZESTRIL) 20 MG tablet TAKE 1 TABLET BY MOUTH EVERY DAY 90 tablet 3  . metFORMIN (GLUCOPHAGE) 500 MG tablet TAKE 1 TABLET BY MOUTH TWICE A DAY WITH MEALS 180 tablet 1  . metoprolol tartrate (LOPRESSOR) 25 MG tablet TAKE 1 TABLET BY MOUTH 2 TIMES DAILY. PLEASE MAKE APPT FOR FUTURE REFILLS. [id number] 1ST ATTEMPT 60 tablet 0   No current facility-administered medications for this visit.     Objective:  BP 122/70   Pulse 97   Temp 98.2 F (36.8 C) (Temporal)   Ht 5\' 7"  (1.702 m)   Wt 188 lb 3.2 oz (85.4 kg)   SpO2 95%   BMI 29.48 kg/m  Gen: NAD, resting comfortably No obvious tongue abnormalities on exam today CV: RRR no murmurs rubs or gallops Lungs: CTAB no crackles, wheeze, rhonchi Ext: no edema Skin: warm, dry    Assessment and Plan  # Sore in mouth/worried well S:Went to dentist for his cleaning a few weeks ago- he noted a slight possible ulceration on right side of tongue- was told to monitor at 6 month follow up. He feels like it has gone away.  A/P: Patient just wanted to be cautious and did not want to wait 6 months for recheck of dentist-I do not see an obvious ulceration on the right side of his tonsil  appears to be improving-possibly he bit his tongue with sleep   # Diabetes S: Compliant with metformin 500 twice a day Exercise and diet-we discussed her calorie counting and plant-based nutrition may be helpful-he states he feels inspired after our conversation today Lab Results  Component Value Date   HGBA1C 6.6 (H) 03/31/2019   HGBA1C 6.9 (H) 12/18/2018   HGBA1C 6.6 (A) 05/28/2018   A/P: Update A1c-hopeful to be able to continue current medication and patient is going to work on lifestyle changes with calorie counting and plant-based nutrition with physical in 14 weeks or so -He declines foot exam today but is willing to do this  next visit -He has upcoming eye exam and he will have them send [place] copy  #hypertension S: compliant with lisinopril 20 mg BP Readings from Last 3 Encounters:  09/22/19 122/70  05/12/19 128/88  03/31/19 114/70  A/P:  Stable. Continue current medications.    #hyperlipidemia S: compliant with atorvastatin 20 mg Lab Results  Component Value Date   CHOL 152 12/18/2018   HDL 32.80 (L) 12/18/2018   LDLCALC 81 12/18/2018   LDLDIRECT 97.0 12/25/2017   TRIG 192.0 (H) 12/18/2018   CHOLHDL 5 12/18/2018   A/P: Slightly above ideal goal of 70 or less for LDL at 81 on last check-we will update direct LDL today and likely full lipid panel at follow-up-plant-based nutrition could certainly help lower these levels   #B12 deficiency-patient self injects monthly.  Last levels were okay but he would like to repeat his blood work today Lab Results  Component Value Date   VITAMINB12 532 03/31/2019   Recommended follow up: Return in about 14 weeks (around 12/29/2019) for physical or sooner if needed.  He is going to call back to schedule this Future Appointments  Date Time Provider [place]  10/30/2019  2:40 PM Skeet Latch, MD [facility]    Lab/Order associations:   ICD-10-CM   1. Diabetes mellitus without complication (HCC)  [id number] Comprehensive metabolic panel    Hemoglobin A1c    LDL cholesterol, direct  2. Hypertension associated with diabetes ([place]) Chronic E11.59    I10   3. Hyperlipidemia associated with type 2 diabetes mellitus ([place])  E11.69    E78.5   4. Pernicious anemia  D51.0 Vitamin B12  5. Physically well but worried  Z71.1    Time Spent: 31 minutes of total time (10:20 AM- 10:51 AM) was spent on the date of the encounter performing the following actions: chart review prior to seeing the patient, obtaining history, performing a medically necessary exam, counseling on the treatment plan, placing orders, and documenting in our EHR.   Return precautions  advised.  Garret Reddish, MD

## 2019-09-23 ENCOUNTER — Encounter: Payer: Self-pay | Admitting: Family Medicine

## 2019-09-24 MED ORDER — ATORVASTATIN CALCIUM 40 MG PO TABS: 40.0000 mg | ORAL_TABLET | Freq: Every day | ORAL | 3 refills | Status: DC

## 2019-10-16 ENCOUNTER — Other Ambulatory Visit: Payer: Self-pay | Admitting: Internal Medicine

## 2019-10-20 ENCOUNTER — Other Ambulatory Visit: Payer: Self-pay

## 2019-10-20 ENCOUNTER — Ambulatory Visit: Payer: BC Managed Care – PPO | Admitting: Podiatry

## 2019-10-20 DIAGNOSIS — L600 Ingrowing nail: Secondary | ICD-10-CM

## 2019-10-26 NOTE — Progress Notes (Signed)
   Subjective: Patient presents today for evaluation of sharp pain to the medial border of the left great toe that began about four weeks ago. He is concerned for a possible ingrown nail. Walking and wearing shoes increases the pain. He has been trimming the nail himself for treatment. Patient presents today for further treatment and evaluation.  Past Medical History:  Diagnosis Date  . Allergy   . Asthma   . B12 deficiency   . Diabetes mellitus without complication (HCC)    diet controlled  . Gastritis   . GERD (gastroesophageal reflux disease)   . Hiatal hernia   . Hyperlipidemia   . Hypertension   . Intestinal metaplasia of gastric cardia   . Intestinal metaplasia of gastric mucosa   . Sinus tachycardia 06/29/2015  . Tubular adenoma of colon     Objective:  General: Well developed, nourished, in no acute distress, alert and oriented x3   Dermatology: Skin is warm, dry and supple bilateral. Medial border of the left great toe appears to be erythematous with evidence of an ingrowing nail. Pain on palpation noted to the border of the nail fold. The remaining nails appear unremarkable at this time. There are no open sores, lesions.  Vascular: Dorsalis Pedis artery and Posterior Tibial artery pedal pulses palpable. No lower extremity edema noted.   Neruologic: Grossly intact via light touch bilateral.  Musculoskeletal: Muscular strength within normal limits in all groups bilateral. Normal range of motion noted to all pedal and ankle joints.   Assesement: #1 Paronychia with ingrowing nail medial border left great toe #2 Pain in toe #3 Incurvated nail  Plan of Care:  1. Patient evaluated.  2. Discussed treatment alternatives and plan of care. Explained nail avulsion procedure and post procedure course to patient. 3. Patient opted for more conservative treatment at this time.  4. Mechanical debridement of the right great toenail performed using a nail nipper. Filed with dremel  without incident.  5. Recommended wide fitting shoes.  6. Recommended routine nail care at a pedicure salon.  7. Return to clinic as needed.   Edrick Kins, DPM [facility]  Dr. Edrick Kins, [address]                                        [place], [place] [address]                Office [phone]  Fax [phone]

## 2019-10-27 ENCOUNTER — Other Ambulatory Visit: Payer: Self-pay

## 2019-10-27 ENCOUNTER — Ambulatory Visit (INDEPENDENT_AMBULATORY_CARE_PROVIDER_SITE_OTHER): Payer: BC Managed Care – PPO | Admitting: Podiatry

## 2019-10-27 DIAGNOSIS — L600 Ingrowing nail: Secondary | ICD-10-CM

## 2019-10-27 MED ORDER — GENTAMICIN SULFATE 0.1 % EX CREA: 1.0000 "application " | TOPICAL_CREAM | Freq: Two times a day (BID) | CUTANEOUS | 1 refills | Status: DC

## 2019-10-27 NOTE — Patient Instructions (Signed)

## 2019-10-29 NOTE — Progress Notes (Signed)
   Subjective: Patient presents today for evaluation of sharp pain to the medial border of the left great toe that began about 6 weeks ago. Patient is concerned for possible ingrown nail. She states the pain improved for a few days after her last visit but has since returned. She has not done anything at home for treatment. Applying pressure to the nail increases the pain. Patient presents today for further treatment and evaluation.  Past Medical History:  Diagnosis Date  . Allergy   . Asthma   . B12 deficiency   . Diabetes mellitus without complication (HCC)    diet controlled  . Gastritis   . GERD (gastroesophageal reflux disease)   . Hiatal hernia   . Hyperlipidemia   . Hypertension   . Intestinal metaplasia of gastric cardia   . Intestinal metaplasia of gastric mucosa   . Sinus tachycardia 06/29/2015  . Tubular adenoma of colon     Objective:  General: Well developed, nourished, in no acute distress, alert and oriented x3   Dermatology: Skin is warm, dry and supple bilateral. Medial border left hallux appears to be erythematous with evidence of an ingrowing nail. Pain on palpation noted to the border of the nail fold. The remaining nails appear unremarkable at this time. There are no open sores, lesions.  Vascular: Dorsalis Pedis artery and Posterior Tibial artery pedal pulses palpable. No lower extremity edema noted.   Neruologic: Grossly intact via light touch bilateral.  Musculoskeletal: Muscular strength within normal limits in all groups bilateral. Normal range of motion noted to all pedal and ankle joints.   Assesement: #1 Paronychia with ingrowing nail medial border left great toe #2 Pain in toe #3 Incurvated nail  Plan of Care:  1. Patient evaluated.  2. Discussed treatment alternatives and plan of care. Explained nail avulsion procedure and post procedure course to patient. 3. Patient opted for permanent partial nail avulsion of the medial border left great toe.    4. Prior to procedure, local anesthesia infiltration utilized using 3 ml of a 50:50 mixture of 2% plain lidocaine and 0.5% plain marcaine in a normal hallux block fashion and a betadine prep performed.  5. Partial permanent nail avulsion with chemical matrixectomy performed using [id number] applications of phenol followed by alcohol flush.  6. Light dressing applied. 7. Prescription for Gentamicin cream provided to patient to use daily with a bandage.  8. Return to clinic in 2 weeks.  Edrick Kins, DPM [facility]  Dr. Edrick Kins, [address]                                        [place], [place] [address]                Office [phone]  Fax [phone]

## 2019-10-30 ENCOUNTER — Other Ambulatory Visit: Payer: Self-pay

## 2019-10-30 ENCOUNTER — Encounter: Payer: Self-pay | Admitting: Cardiovascular Disease

## 2019-10-30 ENCOUNTER — Ambulatory Visit: Payer: BC Managed Care – PPO | Admitting: Cardiovascular Disease

## 2019-10-30 VITALS — BP 116/68 | HR 96 | Ht 67.0 in | Wt 192.0 lb

## 2019-10-30 DIAGNOSIS — I152 Hypertension secondary to endocrine disorders: Secondary | ICD-10-CM

## 2019-10-30 DIAGNOSIS — E1169 Type 2 diabetes mellitus with other specified complication: Secondary | ICD-10-CM | POA: Diagnosis not present

## 2019-10-30 DIAGNOSIS — E785 Hyperlipidemia, unspecified: Secondary | ICD-10-CM

## 2019-10-30 DIAGNOSIS — I10 Essential (primary) hypertension: Secondary | ICD-10-CM | POA: Diagnosis not present

## 2019-10-30 DIAGNOSIS — E1159 Type 2 diabetes mellitus with other circulatory complications: Secondary | ICD-10-CM

## 2019-10-30 NOTE — Progress Notes (Signed)
Cardiology Office Note   Date:  11/17/2019   ID:  Gregory Silver, PhD, DOB Dec 28, 1970, MRN [id number]  PCP:  Marin Olp, MD  Cardiologist:   Skeet Latch, MD   No chief complaint on file.    Patient ID: Gregory Silver, PhD is a 49 y.o. male who presents with hypertension, hyperlipidemia and diabetes type 2 who presents for follow up. Gregory Collins was initially evaluated for palpitations and elevated heart rates.  He wore a 24 hour Holter 03/2015 which showed an average heart rate of 99 bpm.  He limited caffeine intake and started metoprolol which helped somewhat. He reported intermittent episodes of L sided chest pain and had an ETT on 05/21/15 that was negative for ischemia.  He reached 105% of his predicted max heart rate.  He continued to report dyspnea on exertion so he was encouraged to increase his exercise. Metoprolol was also increased to 25 mg twice daily for improved heart rate control.  Gregory Collins has been doing well.  His BP at home has been well-controlled.  It ranges from the 110s-120s/70.  He is walking for exercise 4 days per week for an hour.  He has no exertional chest pain or shortness of breath  He denies lower extremity edema, orthopnea or PND.  His LDL was 110 when last checked so atorvastatin was increased to 40mg .  He is due to have labs rechecked with his PCP in June.   Past Medical History:  Diagnosis Date  . Allergy   . Asthma   . B12 deficiency   . Diabetes mellitus without complication (HCC)    diet controlled  . Gastritis   . GERD (gastroesophageal reflux disease)   . Hiatal hernia   . Hyperlipidemia   . Hypertension   . Intestinal metaplasia of gastric cardia   . Intestinal metaplasia of gastric mucosa   . Sinus tachycardia 06/29/2015  . Tubular adenoma of colon     Past Surgical History:  Procedure Laterality Date  . COLONOSCOPY    . LEG SURGERY     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  . UPPER GASTROINTESTINAL ENDOSCOPY        Current Outpatient Medications  Medication Sig Dispense Refill  . albuterol (VENTOLIN HFA) 108 (90 Base) MCG/ACT inhaler INHALE 2 PUFFS INTO THE LUNGS EVERY 4 (FOUR) HOURS AS NEEDED FOR WHEEZING OR SHORTNESS OF BREATH. 18 Inhaler 1  . aspirin EC 81 MG tablet Take by mouth.    Marland Kitchen atorvastatin (LIPITOR) 40 MG tablet Take 1 tablet (40 mg total) by mouth daily. 90 tablet 3  . cyanocobalamin (,VITAMIN B-12,) 1000 MCG/ML injection INJECT 1 ML (1,000 MCG TOTAL) INTO THE MUSCLE EVERY 30 (THIRTY) DAYS. 3 mL 1  . famotidine (PEPCID) 40 MG tablet TAKE 1 TABLET BY MOUTH TWICE A DAY 180 tablet 1  . fluticasone (FLOVENT HFA) 44 MCG/ACT inhaler Inhale 2 puffs into the lungs 2 (two) times daily. 1 Inhaler 5  . gentamicin cream (GARAMYCIN) 0.1 % Apply 1 application topically 2 (two) times daily. 15 g 1  . lisinopril (ZESTRIL) 20 MG tablet TAKE 1 TABLET BY MOUTH EVERY DAY 90 tablet 3  . metoprolol tartrate (LOPRESSOR) 25 MG tablet TAKE 1 TABLET BY MOUTH 2 TIMES DAILY. PLEASE MAKE APPT FOR FUTURE REFILLS. [phone] 1ST ATTEMPT 60 tablet 0  . metFORMIN (GLUCOPHAGE) 500 MG tablet TAKE 1 TABLET BY MOUTH TWICE A DAY WITH MEALS 180 tablet 1   No current facility-administered medications for this visit.  Allergies:   Patient has no known allergies.    Social History:  The patient  reports that he has never smoked. He has never used smokeless tobacco. He reports current alcohol use of about 3.0 - 4.0 standard drinks of alcohol per week. He reports that he does not use drugs.   Family History:  The patient's family history includes Diabetes in his father and mother; Hyperlipidemia in his father; Hypertension in his father; Kidney disease in his mother; Prostate cancer in his father.    ROS:  Please see the history of present illness.   Otherwise, review of systems are positive for none.   All other systems are reviewed and negative.    PHYSICAL EXAM: VS:  BP 116/68   Pulse 96   Ht 5\' 7"  (1.702 m)    Wt 192 lb (87.1 kg)   SpO2 98%   BMI 30.07 kg/m  , BMI Body mass index is 30.07 kg/m. GENERAL:  Well appearing HEENT: Pupils equal round and reactive, fundi not visualized, oral mucosa unremarkable NECK:  No jugular venous distention, waveform within normal limits, carotid upstroke brisk and symmetric, no bruits LUNGS:  Clear to auscultation bilaterally HEART:  RRR.  PMI not displaced or sustained,S1 and S2 within normal limits, no S3, no S4, no clicks, no rubs, no murmurs ABD:  Flat, positive bowel sounds normal in frequency in pitch, no bruits, no rebound, no guarding, no midline pulsatile mass, no hepatomegaly, no splenomegaly EXT:  2 plus pulses throughout, no edema, no cyanosis no clubbing SKIN:  No rashes no nodules NEURO:  Cranial nerves II through XII grossly intact, motor grossly intact throughout PSYCH:  Cognitively intact, oriented to person place and time   EKG:  EKG is ordered today. 6/713/17: Sinus rhythm. Rate 72 bpm. Inferior T wave inversions. Lateral T wave flattening. 09/04/17: Sinus rhythm.  Rate 75 bpm.  10/31/2019: Sinus tachycardia.  Rate 107 bpm.  24 Hour Holter 03/29/15:  Quality: Fair. Baseline artifact. Predominant rhythm: sinus rhythm Average heart rate: 99 bpm Max heart rate: 141 bpm Min heart rate: 65 bpm Pauses >2.5 seconds: 0 Ventricular ectopics: 0  Supraventricular ectopics: 0 Patient did not submit a symptom diary  Recent Labs: 05/29/2019: Hemoglobin 14.0; Platelets 209.0 09/22/2019: ALT 19; BUN 14; Creatinine, Ser 0.94; Potassium 4.2; Sodium 136    Lipid Panel    Component Value Date/Time   CHOL 152 12/18/2018 0835   TRIG 192.0 (H) 12/18/2018 0835   HDL 32.80 (L) 12/18/2018 0835   CHOLHDL 5 12/18/2018 0835   VLDL 38.4 12/18/2018 0835   LDLCALC 81 12/18/2018 0835   LDLDIRECT 110.0 09/22/2019 1043      Wt Readings from Last 3 Encounters:  10/30/19 192 lb (87.1 kg)  09/22/19 188 lb 3.2 oz (85.4 kg)  05/12/19 176 lb 12.8 oz (80.2  kg)    Other studies Reviewed: Additional studies/ records that were reviewed today include:.Review of the above records demonstrates:  Please see elsewhere in the note.    ETT 05/21/15:    Blood pressure demonstrated a hypertensive response to exercise.  There was no ST segment deviation noted during stress.  Negative, adequate stress test. Excellent exercise capacity.  ASSESSMENT AND PLAN:  # Shortness of breath: Resolved.  Continue exercise.    # Hypertension: BP is well-controlled on current doses of lisinopril and metoprolol.  # CV Disease Prevention: ASCVD 10 year risk is 9.8%.  Continue aspirin 81 mg daily.  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  #  Hyperlipidemia: Continue atorvastatin.  LDL goal is <70.   # Obesity:  Encouraged increased exercise.   Current medicines are reviewed at length with the patient today.  The patient does not have concerns regarding medicines.  The following changes have been made: No chagnes  Labs/ tests ordered today include:   No orders of the defined types were placed in this encounter.    Disposition:   FU with Dr. Jonelle Sidle C. Oval Linsey in 1 year  Signed, Skeet Latch, MD  11/17/2019 11:20 PM    [place]

## 2019-10-30 NOTE — Patient Instructions (Signed)
Medication Instructions:  TAKE THE METOPROLOL TWICE A DAY   *If you need a refill on your cardiac medications before your next appointment, please call your pharmacy*  Lab Work: NONE   Testing/Procedures: NONE  Follow-Up: At [facility], you and your health needs are our priority.  As part of our continuing mission to provide you with exceptional heart care, we have created designated Provider Care Teams.  These Care Teams include your primary Cardiologist (physician) and Advanced Practice Providers (APPs -  Physician Assistants and Nurse Practitioners) who all work together to provide you with the care you need, when you need it.  We recommend signing up for the patient portal called "MyChart".  Sign up information is provided on this After Visit Summary.  MyChart is used to connect with patients for Virtual Visits (Telemedicine).  Patients are able to view lab/test results, encounter notes, upcoming appointments, etc.  Non-urgent messages can be sent to your provider as well.   To learn more about what you can do with MyChart, go to [email or web address].    Your next appointment:   12 month(s)  The format for your next appointment:   In Person  Provider:   You may see DR Avala  or one of the following Advanced Practice Providers on your designated Care Team:    Kerin Ransom, PA-C  [place], [place]  Coletta Memos, [place]

## 2019-11-01 ENCOUNTER — Other Ambulatory Visit: Payer: Self-pay | Admitting: Family Medicine

## 2019-11-12 ENCOUNTER — Other Ambulatory Visit: Payer: Self-pay

## 2019-11-12 ENCOUNTER — Ambulatory Visit (INDEPENDENT_AMBULATORY_CARE_PROVIDER_SITE_OTHER): Payer: BC Managed Care – PPO | Admitting: Podiatry

## 2019-11-12 VITALS — Temp 97.2°F

## 2019-11-12 DIAGNOSIS — L600 Ingrowing nail: Secondary | ICD-10-CM

## 2019-11-14 LAB — HM DIABETES EYE EXAM

## 2019-11-17 ENCOUNTER — Encounter: Payer: Self-pay | Admitting: Cardiovascular Disease

## 2019-11-18 NOTE — Progress Notes (Signed)
   Subjective: 49 y.o. male presents today status post permanent nail avulsion procedure of the medial border of the left hallux that was performed on 10/27/2019. He states he is doing well and the toe is now healed. He denies any pain, redness or drainage. He has been applying the Gentamicin cream as directed. Patient is here for further evaluation and treatment.    Past Medical History:  Diagnosis Date  . Allergy   . Asthma   . B12 deficiency   . Diabetes mellitus without complication (HCC)    diet controlled  . Gastritis   . GERD (gastroesophageal reflux disease)   . Hiatal hernia   . Hyperlipidemia   . Hypertension   . Intestinal metaplasia of gastric cardia   . Intestinal metaplasia of gastric mucosa   . Sinus tachycardia 06/29/2015  . Tubular adenoma of colon     Objective: Skin is warm, dry and supple. Nail and respective nail fold appears to be healing appropriately. Open wound to the associated nail fold with a granular wound base and moderate amount of fibrotic tissue. Minimal drainage noted. Mild erythema around the periungual region likely due to phenol chemical matricectomy.  Assessment: #1 postop permanent partial nail avulsion medial border left hallux  #2 open wound periungual nail fold of respective digit.   Plan of care: #1 patient was evaluated  #2 debridement of open wound was performed to the periungual border of the respective toe using a currette. Antibiotic ointment and Band-Aid was applied. #3 patient is to return to clinic on a PRN basis.   Edrick Kins, DPM [facility]  Dr. Edrick Kins, [address]                                        [place], [place] [address]                Office [phone]  Fax [phone]

## 2019-11-19 ENCOUNTER — Ambulatory Visit: Payer: BC Managed Care – PPO | Admitting: Podiatry

## 2019-12-31 ENCOUNTER — Encounter: Payer: Self-pay | Admitting: Family Medicine

## 2020-01-05 ENCOUNTER — Ambulatory Visit (INDEPENDENT_AMBULATORY_CARE_PROVIDER_SITE_OTHER): Payer: BC Managed Care – PPO | Admitting: Family Medicine

## 2020-01-05 ENCOUNTER — Other Ambulatory Visit: Payer: Self-pay

## 2020-01-05 ENCOUNTER — Encounter: Payer: Self-pay | Admitting: Family Medicine

## 2020-01-05 VITALS — BP 120/70 | HR 75 | Temp 98.2°F | Ht 67.0 in | Wt 186.0 lb

## 2020-01-05 DIAGNOSIS — E1159 Type 2 diabetes mellitus with other circulatory complications: Secondary | ICD-10-CM | POA: Diagnosis not present

## 2020-01-05 DIAGNOSIS — E1169 Type 2 diabetes mellitus with other specified complication: Secondary | ICD-10-CM

## 2020-01-05 DIAGNOSIS — Z125 Encounter for screening for malignant neoplasm of prostate: Secondary | ICD-10-CM | POA: Diagnosis not present

## 2020-01-05 DIAGNOSIS — J4530 Mild persistent asthma, uncomplicated: Secondary | ICD-10-CM

## 2020-01-05 DIAGNOSIS — Z0000 Encounter for general adult medical examination without abnormal findings: Secondary | ICD-10-CM

## 2020-01-05 DIAGNOSIS — E119 Type 2 diabetes mellitus without complications: Secondary | ICD-10-CM

## 2020-01-05 DIAGNOSIS — K219 Gastro-esophageal reflux disease without esophagitis: Secondary | ICD-10-CM

## 2020-01-05 DIAGNOSIS — D510 Vitamin B12 deficiency anemia due to intrinsic factor deficiency: Secondary | ICD-10-CM | POA: Diagnosis not present

## 2020-01-05 DIAGNOSIS — I10 Essential (primary) hypertension: Secondary | ICD-10-CM | POA: Diagnosis not present

## 2020-01-05 DIAGNOSIS — E785 Hyperlipidemia, unspecified: Secondary | ICD-10-CM

## 2020-01-05 DIAGNOSIS — R000 Tachycardia, unspecified: Secondary | ICD-10-CM

## 2020-01-05 DIAGNOSIS — I152 Hypertension secondary to endocrine disorders: Secondary | ICD-10-CM

## 2020-01-05 DIAGNOSIS — Z1159 Encounter for screening for other viral diseases: Secondary | ICD-10-CM

## 2020-01-05 LAB — CBC WITH DIFFERENTIAL/PLATELET
Basophils Absolute: 0.1 10*3/uL (ref 0.0–0.1)
Basophils Relative: 1 % (ref 0.0–3.0)
Eosinophils Absolute: 0.6 10*3/uL (ref 0.0–0.7)
Eosinophils Relative: 11 % — ABNORMAL HIGH (ref 0.0–5.0)
HCT: 40.4 % (ref 39.0–52.0)
Hemoglobin: 13.3 g/dL (ref 13.0–17.0)
Lymphocytes Relative: 39.8 % (ref 12.0–46.0)
Lymphs Abs: 2.1 10*3/uL (ref 0.7–4.0)
MCHC: 32.8 g/dL (ref 30.0–36.0)
MCV: 85.8 fl (ref 78.0–100.0)
Monocytes Absolute: 0.3 10*3/uL (ref 0.1–1.0)
Monocytes Relative: 6.4 % (ref 3.0–12.0)
Neutro Abs: 2.2 10*3/uL (ref 1.4–7.7)
Neutrophils Relative %: 41.8 % — ABNORMAL LOW (ref 43.0–77.0)
Platelets: 204 10*3/uL (ref 150.0–400.0)
RBC: 4.71 Mil/uL (ref 4.22–5.81)
RDW: 14.4 % (ref 11.5–15.5)
WBC: 5.2 10*3/uL (ref 4.0–10.5)

## 2020-01-05 LAB — COMPREHENSIVE METABOLIC PANEL
ALT: 21 U/L (ref 0–53)
AST: 17 U/L (ref 0–37)
Albumin: 4.6 g/dL (ref 3.5–5.2)
Alkaline Phosphatase: 71 U/L (ref 39–117)
BUN: 16 mg/dL (ref 6–23)
CO2: 30 mEq/L (ref 19–32)
Calcium: 9.4 mg/dL (ref 8.4–10.5)
Chloride: 101 mEq/L (ref 96–112)
Creatinine, Ser: 0.91 mg/dL (ref 0.40–1.50)
GFR: 107.81 mL/min (ref >60.00)
Glucose, Bld: 115 mg/dL — ABNORMAL HIGH (ref 70–99)
Potassium: 4.2 mEq/L (ref 3.5–5.1)
Sodium: 139 mEq/L (ref 135–145)
Total Bilirubin: 0.5 mg/dL (ref 0.2–1.2)
Total Protein: 7.2 g/dL (ref 6.0–8.3)

## 2020-01-05 LAB — LIPID PANEL
Cholesterol: 151 mg/dL (ref 0–200)
HDL: 36.7 mg/dL — ABNORMAL LOW (ref >39.00)
LDL Cholesterol: 87 mg/dL (ref 0–99)
NonHDL: 114.01
Total CHOL/HDL Ratio: 4
Triglycerides: 137 mg/dL (ref 0.0–149.0)
VLDL: 27.4 mg/dL (ref 0.0–40.0)

## 2020-01-05 LAB — POC URINALSYSI DIPSTICK (AUTOMATED)
Bilirubin, UA: NEGATIVE
Blood, UA: NEGATIVE
Glucose, UA: NEGATIVE
Ketones, UA: NEGATIVE
Leukocytes, UA: NEGATIVE
Nitrite, UA: NEGATIVE
Protein, UA: NEGATIVE
Spec Grav, UA: 1.025 (ref 1.010–1.025)
Urobilinogen, UA: 0.2 E.U./dL
pH, UA: 6 (ref 5.0–8.0)

## 2020-01-05 LAB — PSA: PSA: 0.39 ng/mL (ref 0.10–4.00)

## 2020-01-05 LAB — HEMOGLOBIN A1C: Hgb A1c MFr Bld: 7 % — ABNORMAL HIGH (ref 4.6–6.5)

## 2020-01-05 LAB — VITAMIN B12: Vitamin B-12: >1526 pg/mL — ABNORMAL HIGH (ref 211–911)

## 2020-01-05 MED ORDER — FLOVENT HFA 44 MCG/ACT IN AERO: 2.0000 | INHALATION_SPRAY | Freq: Two times a day (BID) | RESPIRATORY_TRACT | 5 refills | Status: DC

## 2020-01-05 NOTE — Addendum Note (Signed)
Addended by: Doran Clay A on: 01/05/2020 12:04 PM   Modules accepted: Orders

## 2020-01-05 NOTE — Patient Instructions (Addendum)
Great job losing 6 lbs from April- lets keep up the momentum- keep up the walking. Try to moderate food intake as best as possible  Hopefully labs stable and just check in 4 months from now or sooner if needed  Please stop by lab before you go If you have mychart- we will send your results within 3 business days of [place] receiving them.  If you do not have mychart- we will call you about results within 5 business days of [place] receiving them.

## 2020-01-05 NOTE — Progress Notes (Signed)
Phone: [phone]    Subjective:  Patient presents today for their annual physical. Chief complaint-noted.   See problem oriented charting- Review of Systems  Constitutional: Negative for chills and fever.  HENT: Negative for ear pain.   Eyes: Negative for blurred vision and double vision.  Respiratory: Negative for cough and shortness of breath.   Cardiovascular: Negative for chest pain and palpitations (on metoprolol through Dr. Oval Linsey).  Gastrointestinal: Negative for abdominal pain, heartburn and nausea.  Genitourinary: Negative for dysuria and frequency.  Musculoskeletal: Negative for joint pain and myalgias.  Skin: Negative for itching and rash.  Neurological: Negative for dizziness and headaches.  Endo/Heme/Allergies: Negative for polydipsia. Does not bruise/bleed easily.  Psychiatric/Behavioral: Negative for depression, hallucinations, substance abuse and suicidal ideas.   The following were reviewed and entered/updated in epic: Past Medical History:  Diagnosis Date  . Allergy   . Asthma   . B12 deficiency   . Diabetes mellitus without complication (HCC)    diet controlled  . Gastritis   . GERD (gastroesophageal reflux disease)   . Hiatal hernia   . Hyperlipidemia   . Hypertension   . Intestinal metaplasia of gastric cardia   . Intestinal metaplasia of gastric mucosa   . Sinus tachycardia 06/29/2015  . Tubular adenoma of colon    Patient Active Problem List   Diagnosis Date Noted  . Diabetes mellitus without complication ([place]) 47/82/9562    Priority: High  . Mild persistent asthma 08/03/2015    Priority: Medium  . Sinus tachycardia 06/29/2015    Priority: Medium  . Pernicious anemia 03/29/2015    Priority: Medium  . Hyperlipidemia associated with type 2 diabetes mellitus ([place]) 11/10/2014    Priority: Medium  . Hypertension associated with diabetes ([place]) 11/10/2014    Priority: Medium  . Gastroesophageal reflux disease 06/20/2011    Priority: Medium    . Erectile dysfunction 07/28/2016    Priority: Low  . Allergic rhinitis 11/16/2015    Priority: Low  . Migraine 08/20/2011    Priority: Low   Past Surgical History:  Procedure Laterality Date  . COLONOSCOPY    . LEG SURGERY     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  . UPPER GASTROINTESTINAL ENDOSCOPY      Family History  Problem Relation Age of Onset  . Diabetes Mother   . Kidney disease Mother   . Diabetes Father   . Prostate cancer Father   . Hyperlipidemia Father        mother  . Hypertension Father        mother  . Colon cancer Neg Hx   . Colon polyps Neg Hx   . Esophageal cancer Neg Hx   . Rectal cancer Neg Hx   . Stomach cancer Neg Hx   . Pancreatic cancer Neg Hx   . Allergic rhinitis Neg Hx   . Angioedema Neg Hx   . Asthma Neg Hx   . Eczema Neg Hx   . Immunodeficiency Neg Hx   . Urticaria Neg Hx     Medications- reviewed and updated Current Outpatient Medications  Medication Sig Dispense Refill  . albuterol (VENTOLIN HFA) 108 (90 Base) MCG/ACT inhaler INHALE 2 PUFFS INTO THE LUNGS EVERY 4 (FOUR) HOURS AS NEEDED FOR WHEEZING OR SHORTNESS OF BREATH. 18 Inhaler 1  . aspirin EC 81 MG tablet Take by mouth.    Marland Kitchen atorvastatin (LIPITOR) 40 MG tablet Take 1 tablet (40 mg total) by mouth daily. 90 tablet 3  . cyanocobalamin (,VITAMIN B-12,) 1000  MCG/ML injection INJECT 1 ML (1,000 MCG TOTAL) INTO THE MUSCLE EVERY 30 (THIRTY) DAYS. 3 mL 1  . famotidine (PEPCID) 40 MG tablet TAKE 1 TABLET BY MOUTH TWICE A DAY 180 tablet 1  . fluticasone (FLOVENT HFA) 44 MCG/ACT inhaler Inhale 2 puffs into the lungs 2 (two) times daily. 1 Inhaler 5  . gentamicin cream (GARAMYCIN) 0.1 % Apply 1 application topically 2 (two) times daily. 15 g 1  . lisinopril (ZESTRIL) 20 MG tablet TAKE 1 TABLET BY MOUTH EVERY DAY 90 tablet 3  . metFORMIN (GLUCOPHAGE) 500 MG tablet TAKE 1 TABLET BY MOUTH TWICE A DAY WITH MEALS 180 tablet 1  . metoprolol tartrate (LOPRESSOR) 25 MG tablet TAKE 1 TABLET BY MOUTH 2 TIMES DAILY.  PLEASE MAKE APPT FOR FUTURE REFILLS. [phone] 1ST ATTEMPT 60 tablet 0   No current facility-administered medications for this visit.    Allergies-reviewed and updated No Known Allergies  Social History   Social History Narrative   Married 20 years in 2017. 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    Catering manager, Counselling psychologist-       Hobbies: family time,[place] golfing, basketball with son, walking at park      Objective:  BP 120/70   Pulse 75   Temp 98.2 F (36.8 C)   Ht 5\' 7"  (1.702 m)   Wt 186 lb (84.4 kg)   SpO2 96%   BMI 29.13 kg/m  Gen: NAD, resting comfortably HEENT: Mucous membranes are moist. Oropharynx normal Neck: no thyromegaly CV: RRR no murmurs rubs or gallops Lungs: CTAB no crackles, wheeze, rhonchi Abdomen: soft/nontender/nondistended/normal bowel sounds. No rebound or guarding.  Ext: no edema Skin: warm, dry Neuro: grossly normal, moves all extremities, PERRLA    Diabetic Foot Exam - Simple   Simple Foot Form Diabetic Foot exam was performed with the following findings: Yes 01/05/2020 11:25 AM  Visual Inspection No deformities, no ulcerations, no other skin breakdown bilaterally: Yes Sensation Testing Intact to touch and monofilament testing bilaterally: Yes Pulse Check Posterior Tibialis and Dorsalis pulse intact bilaterally: Yes Comments        Assessment and Plan:  49 y.o. male presenting for annual physical.  Health Maintenance counseling: 1. Anticipatory guidance: Patient counseled regarding regular dental exams q6 months, eye exams ,  avoiding smoking and second hand smoke , limiting alcohol to 2 beverages per day-only drinks socially.   2. Risk factor reduction:  Advised patient of need for regular exercise and diet rich and fruits and vegetables to reduce risk of heart attack and stroke. Exercise- walking 4 days a week for an hour. Diet-down 6  lbs from April- trying to eat better- this is despite just getting back from location  Wt Readings from Last 3 Encounters:  01/05/20 186 lb (84.4 kg)  10/30/19 192 lb (87.1 kg)  09/22/19 188 lb 3.2 oz (85.4 kg)  3. Immunizations/screenings/ancillary studies-discussed one-time lifetime hepatitis C screening with labs  Immunization History  Administered Date(s) Administered  . Influenza,inj,Quad PF,6+ Mos 03/31/2019  . PFIZER SARS-COV-2 Vaccination 08/05/2019, 08/31/2019  4. Prostate cancer screening- Father had prostate cancer-wants to check this at least annually Lab Results  Component Value Date   PSA 0.42 05/30/2019   PSA 0.71 12/18/2018   PSA 0.51 12/25/2017   5. Colon cancer screening - May 22, 2017 with 5-year repeat planned due to tubular adenoma 6. Skin cancer screening/prevention-low  risk due to melanin content. Does not see derm regularly- just as needed. advised regular sunscreen use. Denies worrisome, changing, or new skin lesions.  7. Testicular cancer screening- advised monthly self exams  8. STD screening- patient opts out as monogamous 9. Never smoker-   Status of chronic or acute concerns   # Diabetes S: Medication:metformin 500 twice daily Exercise and diet- see above Lab Results  Component Value Date   HGBA1C 7.0 (H) 09/22/2019   HGBA1C 6.6 (H) 03/31/2019   HGBA1C 6.9 (H) 12/18/2018   A/P: hopefully improved a1c or at least stable-   Sinus tachycardia- remains on metoprolol through Dr. Oval Linsey  Pernicious anemia-patient self injects monthly . Check b12 today Lab Results  Component Value Date   VITAMINB12 293 09/22/2019    Mild persistent asthma without complication- flovent is helpful- needs refill. Not having to use rescue inhaler much as long as on this.   Hypertension associated with diabetes ([place])- BP looks good on lisinopril 20 mg  Hyperlipidemia associated with type 2 diabetes mellitus ([place])- compliatn on aspirin 81 mg and we will check lipid  panel on higher dose atorvastatin 40 mg.   Gastroesophageal reflux disease, unspecified whether esophagitis present- pepcid for reflux twice daily  Through Dr. Hilarie Fredrickson  Recommended follow up: Return in about 4 months (around 05/06/2020) for follow up- or sooner if needed.  Lab/Order associations:not fasting   ICD-10-CM   1. Preventative health care  Z00.00 Hepatitis C Antibody    CBC with Differential/Platelet    Comprehensive metabolic panel    Lipid panel    Hemoglobin A1c    PSA    Vitamin B12    POCT Urinalysis Dipstick (Automated)  2. Diabetes mellitus without complication (HCC)  Y07.3 CBC with Differential/Platelet    Comprehensive metabolic panel    Lipid panel    Hemoglobin A1c  3. Sinus tachycardia  R00.0   4. Pernicious anemia  D51.0 Vitamin B12  5. Mild persistent asthma without complication  X10.62   6. Hypertension associated with diabetes ([place])  E11.59 POCT Urinalysis Dipstick (Automated)   I10   7. Hyperlipidemia associated with type 2 diabetes mellitus (HCC)  E11.69    E78.5   8. Gastroesophageal reflux disease, unspecified whether esophagitis present  K21.9   9. Encounter for hepatitis C screening test for low risk patient  Z11.59 Hepatitis C Antibody  10. Screening for prostate cancer  Z12.5 PSA    Meds ordered this encounter  Medications  . DISCONTD: fluticasone (FLOVENT HFA) 44 MCG/ACT inhaler    Sig: Inhale 2 puffs into the lungs 2 (two) times daily.    Dispense:  1 Inhaler    Refill:  5    Needs an office visit for next refill.  . fluticasone (FLOVENT HFA) 44 MCG/ACT inhaler    Sig: Inhale 2 puffs into the lungs 2 (two) times daily.    Dispense:  1 Inhaler    Refill:  5   Return precautions advised.   Garret Reddish, MD

## 2020-01-06 LAB — HEPATITIS C ANTIBODY
Hepatitis C Ab: NONREACTIVE
SIGNAL TO CUT-OFF: 0.01 (ref <1.00)

## 2020-01-08 ENCOUNTER — Encounter: Payer: Self-pay | Admitting: Family Medicine

## 2020-01-08 MED ORDER — ATORVASTATIN CALCIUM 80 MG PO TABS: 80.0000 mg | ORAL_TABLET | Freq: Every day | ORAL | 3 refills | Status: DC

## 2020-01-10 ENCOUNTER — Other Ambulatory Visit: Payer: Self-pay | Admitting: Family Medicine

## 2020-01-19 ENCOUNTER — Telehealth: Payer: Self-pay | Admitting: Podiatry

## 2020-01-19 NOTE — Telephone Encounter (Signed)
My guarantor number is [id number]. If someone could please call me back at [phone]. Thank you.

## 2020-01-27 ENCOUNTER — Encounter: Payer: Self-pay | Admitting: Podiatry

## 2020-02-23 ENCOUNTER — Other Ambulatory Visit: Payer: Self-pay | Admitting: Cardiovascular Disease

## 2020-03-02 ENCOUNTER — Encounter: Payer: Self-pay | Admitting: Family Medicine

## 2020-03-03 ENCOUNTER — Other Ambulatory Visit: Payer: Self-pay

## 2020-03-04 MED ORDER — METOPROLOL TARTRATE 25 MG PO TABS: ORAL_TABLET | ORAL | 3 refills | Status: DC

## 2020-04-06 ENCOUNTER — Other Ambulatory Visit: Payer: Self-pay | Admitting: Internal Medicine

## 2020-04-19 ENCOUNTER — Other Ambulatory Visit: Payer: Self-pay | Admitting: Family Medicine

## 2020-04-26 ENCOUNTER — Other Ambulatory Visit: Payer: Self-pay | Admitting: Family Medicine

## 2020-05-05 NOTE — Patient Instructions (Addendum)
Health Maintenance Due  Topic Date Due  . PNEUMOCOCCAL POLYSACCHARIDE VACCINE AGE 48-64 HIGH RISK - declines for now Never done  . TETANUS/TDAP - definitely need this if you get a cut/scrape Never done  . INFLUENZA VACCINE In office flu shot today 02/08/2020    Please stop by lab before you go If you have mychart- we will send your results within 3 business days of [place] receiving them.  If you do not have mychart- we will call you about results within 5 business days of [place] receiving them.  *please note we are currently using [facility] which has a longer processing time than [facility] typically so labs may not come back as quickly as in the past *please also note that you will see labs on mychart as soon as they post. I will later go in and write notes on them- will say "notes from Dr. Yong Channel"

## 2020-05-05 NOTE — Progress Notes (Signed)
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 Hypertension  . Diabetes  . Gastroesophageal Reflux  . Hyperlipidemia   This visit occurred during the SARS-CoV-2 public health emergency.  Safety protocols were in place, including screening questions prior to the visit, additional usage of staff PPE, and extensive cleaning of exam room while observing appropriate contact time as indicated for disinfecting solutions.   Past Medical History-  Patient Active Problem List   Diagnosis Date Noted  . Diabetes mellitus without complication ([place]) 73/71/0626    Priority: High  . Mild persistent asthma 08/03/2015    Priority: Medium  . Sinus tachycardia 06/29/2015    Priority: Medium  . Pernicious anemia 03/29/2015    Priority: Medium  . Hyperlipidemia associated with type 2 diabetes mellitus ([place]) 11/10/2014    Priority: Medium  . Hypertension associated with diabetes ([place]) 11/10/2014    Priority: Medium  . Gastroesophageal reflux disease 06/20/2011    Priority: Medium  . Erectile dysfunction 07/28/2016    Priority: Low  . Allergic rhinitis 11/16/2015    Priority: Low  . Migraine 08/20/2011    Priority: Low    Medications- reviewed and updated Current Outpatient Medications  Medication Sig Dispense Refill  . albuterol (VENTOLIN HFA) 108 (90 Base) MCG/ACT inhaler INHALE 2 PUFFS INTO THE LUNGS EVERY 4 (FOUR) HOURS AS NEEDED FOR WHEEZING OR SHORTNESS OF BREATH. 18 Inhaler 1  . aspirin EC 81 MG tablet Take by mouth.    Marland Kitchen atorvastatin (LIPITOR) 80 MG tablet Take 1 tablet (80 mg total) by mouth daily. 90 tablet 3  . cyanocobalamin (,VITAMIN B-12,) 1000 MCG/ML injection INJECT 1 ML (1,000 MCG TOTAL) INTO THE MUSCLE EVERY 30 (THIRTY) DAYS. 3 mL 1  . famotidine (PEPCID) 40 MG tablet TAKE 1 TABLET BY MOUTH TWICE A DAY  180 tablet 1  . lisinopril (ZESTRIL) 20 MG tablet TAKE 1 TABLET BY MOUTH EVERY DAY 90 tablet 3  . metFORMIN (GLUCOPHAGE) 500 MG tablet TAKE 1 TABLET BY MOUTH TWICE A DAY WITH MEALS 180 tablet 1  . metoprolol tartrate (LOPRESSOR) 25 MG tablet 1 tablet by mouth twice daily 180 tablet 3  . fluticasone (FLOVENT HFA) 44 MCG/ACT inhaler Inhale 2 puffs into the lungs 2 (two) times daily. (Patient not taking: Reported on 05/06/2020) 1 Inhaler 5  . gentamicin cream (GARAMYCIN) 0.1 % Apply 1 application topically 2 (two) times daily. (Patient not taking: Reported on 05/06/2020) 15 g 1   No current facility-administered medications for this visit.     Objective:  BP 124/68   Pulse 68   Temp 98.2 F (36.8 C) (Temporal)   Resp 18   Ht 5\' 7"  (1.702 m)   Wt 190 lb 9.6 oz (86.5 kg)   SpO2 99%   BMI 29.85 kg/m  Gen: NAD, resting comfortably CV: RRR no murmurs rubs or gallops Lungs: CTAB no crackles, wheeze, rhonchi Ext: no edema Skin: warm, dry Neuro: grossly normal, moves all extremities     Assessment and Plan   #hypertension S: medication: Metfoprolol 25Mg  BID, lisinopril 20Mg  BP Readings from Last 3 Encounters:  05/06/20 124/68  01/05/20 120/70  10/30/19 116/68  A/P: Stable. Continue current medications.   # Diabetes S: Medication: metformin 500Mg  BID Exercise and diet- weight up 4 lbs from last visit but joined [employer]. Feels could tighten up  on diet Lab Results  Component Value Date   HGBA1C 7.0 (H) 01/05/2020   HGBA1C 7.0 (H) 09/22/2019   HGBA1C 6.6 (H) 03/31/2019   A/P: update a1c. If slightly elevated would prefer to work on lifestyle over increasing meds  # GERD/b12 deficiency likely from metformin S:Medication: famotidine 40 BID through Dr. Hilarie Fredrickson B12 levels related to PPI use: cyanocobalamin 1000Mcg Lab Results  Component Value Date   VITAMINB12 >1526 (H) 01/05/2020   A/P: GERD controlled- continue current meds. b12-continue self injectoins   #hyperlipidemia S:  Medication: atorvastatin 80Mg  up from 40mg  last visit trying to get LDL under 70 Lab Results  Component Value Date   CHOL 151 01/05/2020   HDL 36.70 (L) 01/05/2020   LDLCALC 87 01/05/2020   LDLDIRECT 110.0 09/22/2019   TRIG 137.0 01/05/2020   CHOLHDL 4 01/05/2020   A/P: hopefully LDL below 70- if slightly high work on lifestyle   Recommended follow up: Return in about 4 months (around 09/06/2020) for follow up- or sooner if needed.  Lab/Order associations:   ICD-10-CM   1. Hypertension associated with diabetes ([place])  E11.59 LDL cholesterol, direct   I15.2 CBC With Differential/Platelet    COMPLETE METABOLIC PANEL WITH GFR  2. Gastroesophageal reflux disease, unspecified whether esophagitis present  K21.9   3. Diabetes mellitus without complication (H[facility])  V69.4 LDL cholesterol, direct    Hemoglobin A1c  4. Hyperlipidemia associated with type 2 diabetes mellitus (H[facility])  E11.69 LDL cholesterol, direct   E78.5    Return precautions advised.  Garret Reddish, MD

## 2020-05-06 ENCOUNTER — Ambulatory Visit: Payer: BC Managed Care – PPO | Admitting: Family Medicine

## 2020-05-06 ENCOUNTER — Encounter: Payer: Self-pay | Admitting: Family Medicine

## 2020-05-06 ENCOUNTER — Other Ambulatory Visit: Payer: Self-pay | Admitting: Family Medicine

## 2020-05-06 ENCOUNTER — Other Ambulatory Visit: Payer: Self-pay

## 2020-05-06 VITALS — BP 124/68 | HR 68 | Temp 98.2°F | Resp 18 | Ht 67.0 in | Wt 190.6 lb

## 2020-05-06 DIAGNOSIS — E1159 Type 2 diabetes mellitus with other circulatory complications: Secondary | ICD-10-CM

## 2020-05-06 DIAGNOSIS — Z23 Encounter for immunization: Secondary | ICD-10-CM

## 2020-05-06 DIAGNOSIS — E785 Hyperlipidemia, unspecified: Secondary | ICD-10-CM

## 2020-05-06 DIAGNOSIS — E119 Type 2 diabetes mellitus without complications: Secondary | ICD-10-CM

## 2020-05-06 DIAGNOSIS — K219 Gastro-esophageal reflux disease without esophagitis: Secondary | ICD-10-CM

## 2020-05-06 DIAGNOSIS — I152 Hypertension secondary to endocrine disorders: Secondary | ICD-10-CM

## 2020-05-06 DIAGNOSIS — E1169 Type 2 diabetes mellitus with other specified complication: Secondary | ICD-10-CM

## 2020-05-07 ENCOUNTER — Encounter: Payer: Self-pay | Admitting: Family Medicine

## 2020-05-07 LAB — CBC WITH DIFFERENTIAL/PLATELET
Absolute Monocytes: 424 cells/uL (ref 200–950)
Basophils Absolute: 28 cells/uL (ref 0–200)
Basophils Relative: 0.5 %
Eosinophils Absolute: 633 cells/uL — ABNORMAL HIGH (ref 15–500)
Eosinophils Relative: 11.5 %
HCT: 42.6 % (ref 38.5–50.0)
Hemoglobin: 14.3 g/dL (ref 13.2–17.1)
Lymphs Abs: 2035 cells/uL (ref 850–3900)
MCH: 28.1 pg (ref 27.0–33.0)
MCHC: 33.6 g/dL (ref 32.0–36.0)
MCV: 83.7 fL (ref 80.0–100.0)
MPV: 11.3 fL (ref 7.5–12.5)
Monocytes Relative: 7.7 %
Neutro Abs: 2382 cells/uL (ref 1500–7800)
Neutrophils Relative %: 43.3 %
Platelets: 250 10*3/uL (ref 140–400)
RBC: 5.09 10*6/uL (ref 4.20–5.80)
RDW: 13.3 % (ref 11.0–15.0)
Total Lymphocyte: 37 %
WBC: 5.5 10*3/uL (ref 3.8–10.8)

## 2020-05-07 LAB — COMPLETE METABOLIC PANEL WITH GFR
AG Ratio: 1.7 (calc) (ref 1.0–2.5)
ALT: 25 U/L (ref 9–46)
AST: 17 U/L (ref 10–40)
Albumin: 4.7 g/dL (ref 3.6–5.1)
Alkaline phosphatase (APISO): 72 U/L (ref 36–130)
BUN: 15 mg/dL (ref 7–25)
CO2: 30 mmol/L (ref 20–32)
Calcium: 9.5 mg/dL (ref 8.6–10.3)
Chloride: 99 mmol/L (ref 98–110)
Creat: 0.89 mg/dL (ref 0.60–1.35)
GFR, Est African American: 118 mL/min/{1.73_m2} (ref >=60)
GFR, Est Non African American: 102 mL/min/{1.73_m2} (ref >=60)
Globulin: 2.8 g/dL (calc) (ref 1.9–3.7)
Glucose, Bld: 122 mg/dL — ABNORMAL HIGH (ref 65–99)
Potassium: 4.3 mmol/L (ref 3.5–5.3)
Sodium: 136 mmol/L (ref 135–146)
Total Bilirubin: 0.5 mg/dL (ref 0.2–1.2)
Total Protein: 7.5 g/dL (ref 6.1–8.1)

## 2020-05-07 LAB — HEMOGLOBIN A1C
Hgb A1c MFr Bld: 7.4 % of total Hgb — ABNORMAL HIGH (ref <5.7)
Mean Plasma Glucose: 166 (calc)
eAG (mmol/L): 9.2 (calc)

## 2020-05-07 LAB — LDL CHOLESTEROL, DIRECT: Direct LDL: 107 mg/dL — ABNORMAL HIGH (ref <100)

## 2020-05-10 ENCOUNTER — Other Ambulatory Visit: Payer: Self-pay

## 2020-05-10 MED ORDER — EZETIMIBE 10 MG PO TABS: 10.0000 mg | ORAL_TABLET | Freq: Every day | ORAL | 3 refills | Status: DC

## 2020-05-18 ENCOUNTER — Other Ambulatory Visit: Payer: Self-pay

## 2020-05-18 ENCOUNTER — Telehealth (INDEPENDENT_AMBULATORY_CARE_PROVIDER_SITE_OTHER): Payer: BC Managed Care – PPO | Admitting: Family Medicine

## 2020-05-18 ENCOUNTER — Encounter: Payer: Self-pay | Admitting: Family Medicine

## 2020-05-18 VITALS — Ht 67.0 in | Wt 190.0 lb

## 2020-05-18 DIAGNOSIS — B370 Candidal stomatitis: Secondary | ICD-10-CM

## 2020-05-18 MED ORDER — CLOTRIMAZOLE 10 MG MT TROC: 10.0000 mg | Freq: Every day | OROMUCOSAL | 0 refills | Status: DC

## 2020-05-18 NOTE — Progress Notes (Signed)
Phone [phone] Virtual visit via Video note   Subjective:  Chief complaint: Chief Complaint  Patient presents with  . Sore Throat    White spots, noticing last night. Thought were allergies. Pt has not taken anything for it.    This visit type was conducted due to national recommendations for restrictions regarding the COVID-19 Pandemic (e.g. social distancing).  This format is felt to be most appropriate for this patient at this time balancing risks to patient and risks to population by having him in for in person visit.  No physical exam was performed (except for noted visual exam or audio findings with Telehealth visits).    Our team/I connected with [name], PhD at  1:40 PM EST by a video enabled telemedicine application ([email or web address] or caregility through epic) and verified that I am speaking with the correct person using two identifiers.  Location patient: [facility] Location provider: [facility], office Persons participating in the virtual visit:  patient  Our team/I discussed the limitations of evaluation and management by telemedicine and the availability of in person appointments. In light of current covid-19 pandemic, patient also understands that we are trying to protect them by minimizing in office contact if at all possible.  The patient expressed consent for telemedicine visit and agreed to proceed. Patient understands insurance will be billed.   Past Medical History-  Patient Active Problem List   Diagnosis Date Noted  . Diabetes mellitus without complication ([place]) 48/18/5631    Priority: High  . Mild persistent asthma 08/03/2015    Priority: Medium  . Sinus tachycardia 06/29/2015    Priority: Medium  . Pernicious anemia 03/29/2015    Priority: Medium  . Hyperlipidemia associated with type 2 diabetes mellitus ([place]) 11/10/2014    Priority: Medium  . Hypertension associated with diabetes ([place]) 11/10/2014    Priority: Medium  . Gastroesophageal reflux  disease 06/20/2011    Priority: Medium  . Erectile dysfunction 07/28/2016    Priority: Low  . Allergic rhinitis 11/16/2015    Priority: Low  . Migraine 08/20/2011    Priority: Low    Medications- reviewed and updated Current Outpatient Medications  Medication Sig Dispense Refill  . albuterol (VENTOLIN HFA) 108 (90 Base) MCG/ACT inhaler INHALE 2 PUFFS INTO THE LUNGS EVERY 4 (FOUR) HOURS AS NEEDED FOR WHEEZING OR SHORTNESS OF BREATH. 18 Inhaler 1  . aspirin EC 81 MG tablet Take by mouth.    Marland Kitchen atorvastatin (LIPITOR) 80 MG tablet Take 1 tablet (80 mg total) by mouth daily. 90 tablet 3  . cyanocobalamin (,VITAMIN B-12,) 1000 MCG/ML injection INJECT 1 ML (1,000 MCG TOTAL) INTO THE MUSCLE EVERY 30 (THIRTY) DAYS. 3 mL 1  . ezetimibe (ZETIA) 10 MG tablet Take 1 tablet (10 mg total) by mouth daily. 90 tablet 3  . famotidine (PEPCID) 40 MG tablet TAKE 1 TABLET BY MOUTH TWICE A DAY 180 tablet 1  . lisinopril (ZESTRIL) 20 MG tablet TAKE 1 TABLET BY MOUTH EVERY DAY 90 tablet 3  . metFORMIN (GLUCOPHAGE) 500 MG tablet TAKE 1 TABLET BY MOUTH TWICE A DAY WITH MEALS 180 tablet 1  . metoprolol tartrate (LOPRESSOR) 25 MG tablet 1 tablet by mouth twice daily 180 tablet 3  . fluticasone (FLOVENT HFA) 44 MCG/ACT inhaler Inhale 2 puffs into the lungs 2 (two) times daily. (Patient not taking: Reported on 05/06/2020) 1 Inhaler 5  . gentamicin cream (GARAMYCIN) 0.1 % Apply 1 application topically 2 (two) times daily. (Patient not taking: Reported on 05/06/2020) 15 g  1   No current facility-administered medications for this visit.     Objective:  Ht 5\' 7"  (1.702 m)   Wt 190 lb (86.2 kg)   BMI 29.76 kg/m  self reported vitals Gen: NAD, resting comfortably Patient with white patches on roof of mouth and pharynx and over tonsils noted-patient reports able to rub some of the soft but not completely Lungs: nonlabored, normal respiratory rate  Skin: appears dry, no obvious rash     Assessment and Plan    #White patches in throat S:brushing teeth last night and looked at top of his throat and noted some white discharge in top of his mouth. Still there today.  Feels perhaps mild soreness in these areas.  No nasal congestion or cough reported  A/P: This appears to be thrush.  Patient uses Flovent and reports not washing mouth out well afterwards-we discussed importance of this in the future.  Treat with clotrimazole 5 times a day for 14 days.  Follow-up if fails to improve or new symptoms occur  Recommended follow up: As needed if symptoms fail to improve Future Appointments  Date Time Provider [place]  09/17/2020 11:20 AM Yong Channel Brayton Mars, MD [facility]    Lab/Order associations:   ICD-10-CM   1. Thrush  B37.0     Meds ordered this encounter  Medications  . clotrimazole (MYCELEX) 10 MG troche    Sig: Take 1 tablet (10 mg total) by mouth 5 (five) times daily.    Dispense:  140 Troche    Refill:  0    Return precautions advised.  Garret Reddish, MD

## 2020-06-07 ENCOUNTER — Encounter: Payer: Self-pay | Admitting: Family Medicine

## 2020-06-17 ENCOUNTER — Other Ambulatory Visit: Payer: Self-pay | Admitting: Family Medicine

## 2020-08-31 ENCOUNTER — Other Ambulatory Visit: Payer: Self-pay | Admitting: Family Medicine

## 2020-09-17 ENCOUNTER — Ambulatory Visit: Payer: BC Managed Care – PPO | Admitting: Family Medicine

## 2020-09-20 NOTE — Progress Notes (Signed)
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  Safety protocols were in place, including screening questions prior to the visit, additional usage of staff PPE, and extensive cleaning of exam room while observing appropriate contact time as indicated for disinfecting solutions.   Past Medical History-  Patient Active Problem List   Diagnosis Date Noted   Diabetes mellitus without complication ([place]) 01/07/1600    Priority: High   Mild persistent asthma 08/03/2015    Priority: Medium   Sinus tachycardia 06/29/2015    Priority: Medium   Pernicious anemia 03/29/2015    Priority: Medium   Hyperlipidemia associated with type 2 diabetes mellitus ([place]) 11/10/2014    Priority: Medium   Hypertension associated with diabetes ([place]) 11/10/2014    Priority: Medium   Gastroesophageal reflux disease 06/20/2011    Priority: Medium   Erectile dysfunction 07/28/2016    Priority: Low   Allergic rhinitis 11/16/2015    Priority: Low   Migraine 08/20/2011    Priority: Low    Medications- reviewed and updated Current Outpatient Medications  Medication Sig Dispense Refill   albuterol (VENTOLIN HFA) 108 (90 Base) MCG/ACT inhaler INHALE 2 PUFFS INTO THE LUNGS EVERY 4 (FOUR) HOURS AS NEEDED FOR WHEEZING OR SHORTNESS OF BREATH. 18 Inhaler 1   aspirin EC 81 MG tablet Take by mouth.     atorvastatin (LIPITOR) 80 MG tablet Take 1 tablet (80 mg total) by mouth daily. 90 tablet 3   cyanocobalamin (,VITAMIN B-12,) 1000 MCG/ML injection INJECT 1 ML (1,000 MCG TOTAL) INTO THE MUSCLE EVERY 30 (THIRTY) DAYS. 3 mL 1   ezetimibe (ZETIA) 10 MG tablet Take 1 tablet (10 mg total) by mouth daily. 90 tablet 3    fluticasone (FLOVENT HFA) 44 MCG/ACT inhaler Inhale 2 puffs into the lungs 2 (two) times daily. 1 Inhaler 5   lisinopril (ZESTRIL) 20 MG tablet TAKE 1 TABLET BY MOUTH EVERY DAY 90 tablet 3   metFORMIN (GLUCOPHAGE) 500 MG tablet TAKE 1 TABLET BY MOUTH TWICE A DAY WITH MEALS 180 tablet 1   metoprolol tartrate (LOPRESSOR) 25 MG tablet 1 tablet by mouth twice daily 180 tablet 3   clotrimazole (MYCELEX) 10 MG troche Take 1 tablet (10 mg total) by mouth 5 (five) times daily. (Patient not taking: Reported on 09/23/2020) 140 Troche 0   famotidine (PEPCID) 40 MG tablet TAKE 1 TABLET BY MOUTH TWICE A DAY (Patient not taking: Reported on 09/23/2020) 180 tablet 1   gentamicin cream (GARAMYCIN) 0.1 % Apply 1 application topically 2 (two) times daily. (Patient not taking: No sig reported) 15 g 1   No current facility-administered medications for this visit.     Objective:  BP 120/74    Pulse 72    Temp 97.6 F (36.4 C) (Temporal)    Ht 5\' 7"  (1.702 m)    Wt 192 lb 6.4 oz (87.3 kg)    SpO2 95%    BMI 30.13 kg/m  Gen: NAD, resting comfortably CV: RRR no murmurs rubs or gallops Lungs: CTAB no crackles, wheeze, rhonchi Abdomen: soft/nontender/nondistended/normal bowel sounds.  Ext: no edema Skin: warm, dry    Assessment and Plan   # Diabetes S: Medication:Metformin 500 mg twice daily -Monthly vitamin B12 injections  due to long-term Metformin use and prior lows  Exercise and diet-  Doing treadmill daily 4-5 days a week- doing 400 calories off each time. Has tried to improve diet as well- some more vegan options that family eats Lab Results  Component Value Date   HGBA1C 7.4 (H) 05/06/2020   A/P: congratulated him on lifestyle changes- he is slightly discouraged weight is not down but im really hoping big improvement in a1c which is a better marker of overalll health   #hypertension S: medication: Lisinopril 20Mg , Metoprolol 25Mg  twice daily BP Readings from Last 3 Encounters:  09/23/20 120/74   05/06/20 124/68  01/05/20 120/70  A/P: Stable. Continue current medications.   #hyperlipidemia-LDL goal under 70 S: Medication: Atorvastatin 80Mg , Zetia 10Mg  added October 2021, also takes aspirin 81 mg for primary prevention  A/P: no issues with adding zetia- we will check LDL and see if closer to goal of 70 or less. He would like to come off zetia- would probably consider that if # down in the 66s or so  # Asthma S: Maintenance Medication: Flovent 44 mcg 2 puffs twice daily As needed medication: albuterol. Patient is using this once a month  A/P: Stable. Continue current medications.    #Pernicious anemia S: Discovered by Dr. Laney Pastor.  Receives injections of B12 monthly Lab Results  Component Value Date   VITAMINB12 >1526 (H) 01/05/2020   A/P: repeat at physical   # GERD S:Medication: Famotidine 40 mg twice daily through Dr. Hilarie Fredrickson A/P: Stable. Continue current medications.    #Health maintenance-recommended Prevnar 20 when available as well as going ahead and receiving Tdap.  He will let us know dates of covid-19 booster  Recommended follow up: Return in about 4 months (around 01/23/2021) for physical or sooner if needed.  Lab/Order associations:   ICD-10-CM   1. Hypertension associated with diabetes ([place])  E11.59    I15.2   2. Hyperlipidemia associated with type 2 diabetes mellitus ([place])  E11.69    E78.5   3. Diabetes mellitus without complication (H[facility])  K20.8 CBC with Differential/Platelet    Comprehensive metabolic panel    LDL cholesterol, direct    Hemoglobin A1c  4. Mild persistent asthma without complication  H38.87    Return precautions advised.  Garret Reddish, MD

## 2020-09-20 NOTE — Patient Instructions (Addendum)
  Health Maintenance Due  Topic Date Due  . PNEUMOCOCCAL POLYSACCHARIDE VACCINE AGE 49-64 HIGH RISK-likely wait until Prevnar 20 is available-you can also get this at your pharmacy if you prefer Never done  . TETANUS/TDAP-I would recommend you receive this today if you are willing. Declines for now Never done  . COVID-19 Vaccine (3 - Booster for [employer] series) Patient will call back with dates.  02/28/2020    Please stop by lab before you go If you have mychart- we will send your results within 3 business days of [place] receiving them.  If you do not have mychart- we will call you about results within 5 business days of [place] receiving them.  *please also note that you will see labs on mychart as soon as they post. I will later go in and write notes on them- will say "notes from Dr. Yong Channel"   Keep up the great job with exercise! Hoping #s will show off your progress  Recommended follow up: Return in about 4 months (around 01/23/2021) for physical or sooner if needed.

## 2020-09-23 ENCOUNTER — Other Ambulatory Visit: Payer: Self-pay

## 2020-09-23 ENCOUNTER — Ambulatory Visit (INDEPENDENT_AMBULATORY_CARE_PROVIDER_SITE_OTHER): Payer: BC Managed Care – PPO | Admitting: Family Medicine

## 2020-09-23 ENCOUNTER — Encounter: Payer: Self-pay | Admitting: Family Medicine

## 2020-09-23 VITALS — BP 120/74 | HR 72 | Temp 97.6°F | Ht 67.0 in | Wt 192.4 lb

## 2020-09-23 DIAGNOSIS — J4530 Mild persistent asthma, uncomplicated: Secondary | ICD-10-CM

## 2020-09-23 DIAGNOSIS — E1159 Type 2 diabetes mellitus with other circulatory complications: Secondary | ICD-10-CM | POA: Diagnosis not present

## 2020-09-23 DIAGNOSIS — E1169 Type 2 diabetes mellitus with other specified complication: Secondary | ICD-10-CM | POA: Diagnosis not present

## 2020-09-23 DIAGNOSIS — E785 Hyperlipidemia, unspecified: Secondary | ICD-10-CM

## 2020-09-23 DIAGNOSIS — I152 Hypertension secondary to endocrine disorders: Secondary | ICD-10-CM

## 2020-09-23 DIAGNOSIS — E119 Type 2 diabetes mellitus without complications: Secondary | ICD-10-CM | POA: Diagnosis not present

## 2020-09-23 LAB — CBC WITH DIFFERENTIAL/PLATELET
Basophils Absolute: 0.1 10*3/uL (ref 0.0–0.1)
Basophils Relative: 0.8 % (ref 0.0–3.0)
Eosinophils Absolute: 1.2 10*3/uL — ABNORMAL HIGH (ref 0.0–0.7)
Eosinophils Relative: 20.1 % — ABNORMAL HIGH (ref 0.0–5.0)
HCT: 39.7 % (ref 39.0–52.0)
Hemoglobin: 13.4 g/dL (ref 13.0–17.0)
Lymphocytes Relative: 34.1 % (ref 12.0–46.0)
Lymphs Abs: 2.1 10*3/uL (ref 0.7–4.0)
MCHC: 33.7 g/dL (ref 30.0–36.0)
MCV: 84.4 fl (ref 78.0–100.0)
Monocytes Absolute: 0.4 10*3/uL (ref 0.1–1.0)
Monocytes Relative: 5.7 % (ref 3.0–12.0)
Neutro Abs: 2.4 10*3/uL (ref 1.4–7.7)
Neutrophils Relative %: 39.3 % — ABNORMAL LOW (ref 43.0–77.0)
Platelets: 193 10*3/uL (ref 150.0–400.0)
RBC: 4.71 Mil/uL (ref 4.22–5.81)
RDW: 14.3 % (ref 11.5–15.5)
WBC: 6.2 10*3/uL (ref 4.0–10.5)

## 2020-09-23 LAB — COMPREHENSIVE METABOLIC PANEL
ALT: 27 U/L (ref 0–53)
AST: 18 U/L (ref 0–37)
Albumin: 4.3 g/dL (ref 3.5–5.2)
Alkaline Phosphatase: 67 U/L (ref 39–117)
BUN: 14 mg/dL (ref 6–23)
CO2: 25 mEq/L (ref 19–32)
Calcium: 9.1 mg/dL (ref 8.4–10.5)
Chloride: 101 mEq/L (ref 96–112)
Creatinine, Ser: 0.88 mg/dL (ref 0.40–1.50)
GFR: 101.86 mL/min (ref >60.00)
Glucose, Bld: 212 mg/dL — ABNORMAL HIGH (ref 70–99)
Potassium: 4.3 mEq/L (ref 3.5–5.1)
Sodium: 136 mEq/L (ref 135–145)
Total Bilirubin: 0.5 mg/dL (ref 0.2–1.2)
Total Protein: 7.1 g/dL (ref 6.0–8.3)

## 2020-09-23 LAB — LDL CHOLESTEROL, DIRECT: Direct LDL: 80 mg/dL

## 2020-09-23 LAB — HEMOGLOBIN A1C: Hgb A1c MFr Bld: 7.9 % — ABNORMAL HIGH (ref 4.6–6.5)

## 2020-09-24 ENCOUNTER — Other Ambulatory Visit: Payer: Self-pay

## 2020-09-24 ENCOUNTER — Encounter: Payer: Self-pay | Admitting: Family Medicine

## 2020-09-24 MED ORDER — METFORMIN HCL 1000 MG PO TABS: 1000.0000 mg | ORAL_TABLET | Freq: Two times a day (BID) | ORAL | 3 refills | Status: DC

## 2020-10-15 ENCOUNTER — Encounter: Payer: Self-pay | Admitting: Family Medicine

## 2020-10-27 ENCOUNTER — Encounter: Payer: Self-pay | Admitting: Family Medicine

## 2020-10-28 MED ORDER — METFORMIN HCL ER 500 MG PO TB24: 1000.0000 mg | ORAL_TABLET | Freq: Two times a day (BID) | ORAL | 5 refills | Status: DC

## 2020-11-15 LAB — HM DIABETES EYE EXAM

## 2020-12-10 ENCOUNTER — Other Ambulatory Visit: Payer: Self-pay | Admitting: Family Medicine

## 2020-12-22 ENCOUNTER — Other Ambulatory Visit: Payer: Self-pay | Admitting: Family Medicine

## 2021-01-20 ENCOUNTER — Other Ambulatory Visit: Payer: Self-pay

## 2021-01-20 ENCOUNTER — Encounter: Payer: Self-pay | Admitting: Family Medicine

## 2021-01-20 MED ORDER — FLUTICASONE PROPIONATE HFA 44 MCG/ACT IN AERO: 2.0000 | INHALATION_SPRAY | Freq: Two times a day (BID) | RESPIRATORY_TRACT | 5 refills | Status: DC

## 2021-01-20 MED ORDER — ALBUTEROL SULFATE HFA 108 (90 BASE) MCG/ACT IN AERS: 2.0000 | INHALATION_SPRAY | RESPIRATORY_TRACT | 5 refills | Status: DC | PRN

## 2021-01-26 ENCOUNTER — Other Ambulatory Visit: Payer: Self-pay | Admitting: Internal Medicine

## 2021-01-27 ENCOUNTER — Encounter: Payer: Self-pay | Admitting: Family Medicine

## 2021-01-27 ENCOUNTER — Telehealth: Payer: Self-pay | Admitting: Internal Medicine

## 2021-01-27 ENCOUNTER — Other Ambulatory Visit: Payer: Self-pay | Admitting: Internal Medicine

## 2021-01-27 NOTE — Telephone Encounter (Signed)
Inbound call from patient requesting additional refills for Pepcid medication to be sent to [facility] pharmacy in chart please.

## 2021-01-27 NOTE — Telephone Encounter (Signed)
As noted on the last 2 refill requests, patient does need an appointment for follow up before sending meds.

## 2021-01-28 NOTE — Telephone Encounter (Signed)
Patient scheduled for 02/23/21.

## 2021-02-03 NOTE — Progress Notes (Signed)
Phone: [phone]   Subjective:  Patient presents today for their annual physical. Chief complaint-noted.   See problem oriented charting- ROS- full  review of systems was completed and negative per full ROS sheet  The following were reviewed and entered/updated in epic: Past Medical History:  Diagnosis Date   Allergy    Asthma    B12 deficiency    Diabetes mellitus without complication ([place])    diet controlled   Gastritis    GERD (gastroesophageal reflux disease)    Hiatal hernia    Hyperlipidemia    Hypertension    Intestinal metaplasia of gastric cardia    Intestinal metaplasia of gastric mucosa    Sinus tachycardia 06/29/2015   Tubular adenoma of colon    Patient Active Problem List   Diagnosis Date Noted   Diabetes mellitus without complication ([place]) [id number]    Priority: High   Mild persistent asthma 08/03/2015    Priority: Medium   Sinus tachycardia 06/29/2015    Priority: Medium   Pernicious anemia 03/29/2015    Priority: Medium   Hyperlipidemia associated with type 2 diabetes mellitus ([employer]) 11/10/2014    Priority: Medium   Hypertension associated with diabetes ([place]) 11/10/2014    Priority: Medium   Gastroesophageal reflux disease 06/20/2011    Priority: Medium   Erectile dysfunction 07/28/2016    Priority: Low   Allergic rhinitis 11/16/2015    Priority: Low   Migraine 08/20/2011    Priority: Low   Past Surgical History:  Procedure Laterality Date   COLONOSCOPY     LEG SURGERY     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   UPPER GASTROINTESTINAL ENDOSCOPY      Family History  Problem Relation Age of Onset   Diabetes Mother    Kidney disease Mother    Diabetes Father    Prostate cancer Father    Hyperlipidemia Father        mother   Hypertension Father        mother   Colon cancer Neg Hx    Colon polyps Neg Hx    Esophageal cancer Neg Hx    Rectal cancer Neg Hx    Stomach cancer Neg Hx    Pancreatic cancer Neg Hx    Allergic rhinitis Neg Hx     Angioedema Neg Hx    Asthma Neg Hx    Eczema Neg Hx    Immunodeficiency Neg Hx    Urticaria Neg Hx     Medications- reviewed and updated Current Outpatient Medications  Medication Sig Dispense Refill   albuterol (VENTOLIN HFA) 108 (90 Base) MCG/ACT inhaler Inhale 2 puffs into the lungs every 4 (four) hours as needed for wheezing or shortness of breath. 18 each 5   atorvastatin (LIPITOR) 80 MG tablet TAKE 1 TABLET BY MOUTH EVERY DAY 90 tablet 3   cyanocobalamin (,VITAMIN B-12,) 1000 MCG/ML injection INJECT 1 ML (1,000 MCG TOTAL) INTO THE MUSCLE EVERY 30 (THIRTY) DAYS. 3 mL 1   famotidine (PEPCID) 40 MG tablet TAKE 1 TABLET BY MOUTH TWICE A DAY 180 tablet 1   fluticasone (FLOVENT HFA) 44 MCG/ACT inhaler Inhale 2 puffs into the lungs 2 (two) times daily. 1 each 5   metFORMIN (GLUCOPHAGE-XR) 500 MG 24 hr tablet Take 2 tablets (1,000 mg total) by mouth in the morning and at bedtime. 120 tablet 5   metoprolol tartrate (LOPRESSOR) 25 MG tablet 1 tablet by mouth twice daily 180 tablet 3   ezetimibe (ZETIA) 10 MG tablet Take 1 tablet (  10 mg total) by mouth daily. 90 tablet 3   lisinopril (ZESTRIL) 20 MG tablet Take 1 tablet (20 mg total) by mouth daily. 90 tablet 3   No current facility-administered medications for this visit.    Allergies-reviewed and updated No Known Allergies  Social History   Social History Narrative   Married 20 years in 2017. 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    Catering manager, Counselling psychologist-       Hobbies: family time,[place] golfing, basketball with son, walking at park   Objective  Objective:  BP 120/76 (BP Location: Left Arm, Patient Position: Sitting, Cuff Size: Normal)   Pulse 71   Temp 97.7 F (36.5 C) (Temporal)   Ht '5\' 8"'$  (1.727 m)   Wt 174 lb (78.9 kg)   SpO2 97%   BMI 26.46 kg/m  Gen: NAD, resting comfortably HEENT: Mucous membranes are moist. Oropharynx  normal Neck: no thyromegaly CV: RRR no murmurs rubs or gallops Lungs: CTAB no crackles, wheeze, rhonchi Abdomen: soft/nontender/nondistended/normal bowel sounds. No rebound or guarding.  Ext: no edema Skin: warm, dry Neuro: grossly normal, moves all extremities, PERRLA    Diabetic Foot Exam - Simple   Simple Foot Form Diabetic Foot exam was performed with the following findings: Yes 02/04/2021  8:35 AM  Visual Inspection No deformities, no ulcerations, no other skin breakdown bilaterally: Yes Sensation Testing Intact to touch and monofilament testing bilaterally: Yes Pulse Check Posterior Tibialis and Dorsalis pulse intact bilaterally: Yes Comments        Assessment and Plan  50 y.o. male presenting for annual physical.  Health Maintenance counseling: 1. Anticipatory guidance: Patient counseled regarding regular dental exams -q6 months, eye exams -yearly,  avoiding smoking and second hand smoke , limiting alcohol to 2 beverages per day - maybe one every 2 weeks.   2. Risk factor reduction:  Advised patient of need for regular exercise and diet rich and fruits and vegetables to reduce risk of heart attack and stroke. Exercise- up to an hour most days. Diet-weight down significantly 18 lbs from last visit- he feels better overall.  Wt Readings from Last 3 Encounters:  02/04/21 174 lb (78.9 kg)  09/23/20 192 lb 6.4 oz (87.3 kg)  05/18/20 190 lb (86.2 kg)  3. Immunizations/screenings/ancillary studies-with diabetes discussed Prevnar 20- wants to hold until later date, discussed Tdap-declines, discussed he would be due for this if he got a cut or scrape, discussed [place] VID-19 vaccination- has had 3rd- we need date  Immunization History  Administered Date(s) Administered   Influenza,inj,Quad PF,6+ Mos 03/31/2019, 05/06/2020   PFIZER(Purple Top)SARS-COV-2 Vaccination 08/05/2019, 08/31/2019  4. Prostate cancer screening- low risk prior PSA trend, continue to check with blood work.  Follows  with prostate cancer Lab Results  Component Value Date   PSA 0.39 01/05/2020   PSA 0.42 05/30/2019   PSA 0.71 12/18/2018   5. Colon cancer screening - May 22 2017 with 5-year repeat planned due to tubular adenoma 6. Skin cancer screening-lower risk due to melanin content. advised regular sunscreen use. Denies worrisome, changing, or new skin lesions.  7.  Never smoker 8. STD screening - opts out as monogamous  Status of chronic or acute concerns   # Diabetes S: Medication:Metformin 1000 mg twice daily increased from '500mg'$  BID last visit -Monthly vitamin B12 injections due to long-term Metformin use and prior lows  Exercise and diet-  see above Lab Results  Component Value Date   HGBA1C 7.9 (H) 09/23/2020   HGBA1C 7.4 (H) 05/06/2020   HGBA1C 7.0 (H) 01/05/2020  A/P: Poor control last visit-hopefully improved with increase in metformin and weight loss-update A1c and continue to work on lifestyle changes. If a1c is 6 or below consider going back to '500mg'$  twice daily  #hypertension S: medication: Lisinopril 20 mg daily, Metoprolol 25 mg twice daily BP Readings from Last 3 Encounters:  02/04/21 120/76  09/23/20 120/74  05/06/20 124/68  A/P:  Stable. Continue current medications.    -discussed if dehydrated to hold lisinopril   #hyperlipidemia-LDL goal under 70 S: Medication: Atorvastatin 80 mg daily, Zetia 10 mg daily- added October 2021, also takes aspirin 81 mg for primary prevention Lab Results  Component Value Date   CHOL 151 01/05/2020   HDL 36.70 (L) 01/05/2020   LDLCALC 87 01/05/2020   LDLDIRECT 80.0 09/23/2020   TRIG 137.0 01/05/2020   CHOLHDL 4 01/05/2020  A/P: LDL still above goal ideal goal of 70 or less despite atorvastatin 80 mg and Zetia 10 mg-we opted to continue current medications as long as LDL at least below 100. If significantly lower such as 50s could cut out zetia  # Asthma S: Maintenance Medication: Flovent 44 mcg 2 puffs twice daily- more as  needed As needed medication: albuterol 108 mcg/act inhaler . Patient is using this as needed perhaps once a week  A/P: doing well- continue current meds   #Pernicious anemia S: Discovered by Dr. Laney Pastor. Receives injections of B12 at home Lab Results  Component Value Date   VITAMINB12 >1526 (H) 01/05/2020   A/P: Suspect well-controlled-update B12 labs again today  # GERD S:Medication: Famotidine 40 mg twice daily through Dr. Hilarie Fredrickson A/P: Stable. Continue current medications.    Recommended follow up: Return in about 4 months (around 06/07/2021) for follow up- or sooner if needed.  If A1c is under 7 could potentially do 6 months. Future Appointments  Date Time Provider [place]  02/23/2021  8:30 AM Esterwood, Amy S, PA-C [facility]   Lab/Order associations:   ICD-10-CM   1. Preventative health care  Z00.00 CBC with Differential/Platelet    Comprehensive metabolic panel    Lipid panel    Hemoglobin A1c    PSA    Vitamin B12    2. Diabetes mellitus without complication (HCC)  [id number] CBC with Differential/Platelet    Comprehensive metabolic panel    Lipid panel    Hemoglobin A1c    3. Hypertension associated with diabetes ([place])  E11.59    I15.2     4. Hyperlipidemia associated with type 2 diabetes mellitus ([place])  E11.69    E78.5     5. Pernicious anemia  D51.0 Vitamin B12    6. Screening for prostate cancer  Z12.5 PSA     Meds ordered this encounter  Medications   ezetimibe (ZETIA) 10 MG tablet    Sig: Take 1 tablet (10 mg total) by mouth daily.    Dispense:  90 tablet    Refill:  3   lisinopril (ZESTRIL) 20 MG tablet    Sig: Take 1 tablet (20 mg total) by mouth daily.    Dispense:  90 tablet    Refill:  3    Return precautions advised.  Garret Reddish, MD

## 2021-02-04 ENCOUNTER — Other Ambulatory Visit: Payer: Self-pay

## 2021-02-04 ENCOUNTER — Ambulatory Visit: Payer: BC Managed Care – PPO | Admitting: Family Medicine

## 2021-02-04 ENCOUNTER — Encounter: Payer: Self-pay | Admitting: Family Medicine

## 2021-02-04 VITALS — BP 120/76 | HR 71 | Temp 97.7°F | Ht 68.0 in | Wt 174.0 lb

## 2021-02-04 DIAGNOSIS — E119 Type 2 diabetes mellitus without complications: Secondary | ICD-10-CM

## 2021-02-04 DIAGNOSIS — Z125 Encounter for screening for malignant neoplasm of prostate: Secondary | ICD-10-CM

## 2021-02-04 DIAGNOSIS — E1159 Type 2 diabetes mellitus with other circulatory complications: Secondary | ICD-10-CM | POA: Diagnosis not present

## 2021-02-04 DIAGNOSIS — E1169 Type 2 diabetes mellitus with other specified complication: Secondary | ICD-10-CM | POA: Diagnosis not present

## 2021-02-04 DIAGNOSIS — I152 Hypertension secondary to endocrine disorders: Secondary | ICD-10-CM

## 2021-02-04 DIAGNOSIS — Z0000 Encounter for general adult medical examination without abnormal findings: Secondary | ICD-10-CM | POA: Diagnosis not present

## 2021-02-04 DIAGNOSIS — D510 Vitamin B12 deficiency anemia due to intrinsic factor deficiency: Secondary | ICD-10-CM | POA: Diagnosis not present

## 2021-02-04 DIAGNOSIS — E785 Hyperlipidemia, unspecified: Secondary | ICD-10-CM

## 2021-02-04 LAB — LIPID PANEL
Cholesterol: 153 mg/dL (ref 0–200)
HDL: 34.8 mg/dL — ABNORMAL LOW (ref >39.00)
LDL Cholesterol: 90 mg/dL (ref 0–99)
NonHDL: 117.76
Total CHOL/HDL Ratio: 4
Triglycerides: 138 mg/dL (ref 0.0–149.0)
VLDL: 27.6 mg/dL (ref 0.0–40.0)

## 2021-02-04 LAB — PSA: PSA: 0.44 ng/mL (ref 0.10–4.00)

## 2021-02-04 LAB — CBC WITH DIFFERENTIAL/PLATELET
Basophils Absolute: 0 10*3/uL (ref 0.0–0.1)
Basophils Relative: 0.6 % (ref 0.0–3.0)
Eosinophils Absolute: 1.1 10*3/uL — ABNORMAL HIGH (ref 0.0–0.7)
Eosinophils Relative: 17 % — ABNORMAL HIGH (ref 0.0–5.0)
HCT: 41.9 % (ref 39.0–52.0)
Hemoglobin: 13.9 g/dL (ref 13.0–17.0)
Lymphocytes Relative: 34.1 % (ref 12.0–46.0)
Lymphs Abs: 2.1 10*3/uL (ref 0.7–4.0)
MCHC: 33.2 g/dL (ref 30.0–36.0)
MCV: 86.4 fl (ref 78.0–100.0)
Monocytes Absolute: 0.3 10*3/uL (ref 0.1–1.0)
Monocytes Relative: 5.5 % (ref 3.0–12.0)
Neutro Abs: 2.7 10*3/uL (ref 1.4–7.7)
Neutrophils Relative %: 42.8 % — ABNORMAL LOW (ref 43.0–77.0)
Platelets: 212 10*3/uL (ref 150.0–400.0)
RBC: 4.84 Mil/uL (ref 4.22–5.81)
RDW: 14.1 % (ref 11.5–15.5)
WBC: 6.2 10*3/uL (ref 4.0–10.5)

## 2021-02-04 LAB — COMPREHENSIVE METABOLIC PANEL
ALT: 22 U/L (ref 0–53)
AST: 15 U/L (ref 0–37)
Albumin: 4.6 g/dL (ref 3.5–5.2)
Alkaline Phosphatase: 83 U/L (ref 39–117)
BUN: 15 mg/dL (ref 6–23)
CO2: 30 mEq/L (ref 19–32)
Calcium: 9.3 mg/dL (ref 8.4–10.5)
Chloride: 99 mEq/L (ref 96–112)
Creatinine, Ser: 0.94 mg/dL (ref 0.40–1.50)
GFR: 95.78 mL/min (ref >60.00)
Glucose, Bld: 123 mg/dL — ABNORMAL HIGH (ref 70–99)
Potassium: 4.3 mEq/L (ref 3.5–5.1)
Sodium: 137 mEq/L (ref 135–145)
Total Bilirubin: 0.4 mg/dL (ref 0.2–1.2)
Total Protein: 7.2 g/dL (ref 6.0–8.3)

## 2021-02-04 LAB — VITAMIN B12: Vitamin B-12: 228 pg/mL (ref 211–911)

## 2021-02-04 LAB — HEMOGLOBIN A1C: Hgb A1c MFr Bld: 7 % — ABNORMAL HIGH (ref 4.6–6.5)

## 2021-02-04 MED ORDER — LISINOPRIL 20 MG PO TABS: 20.0000 mg | ORAL_TABLET | Freq: Every day | ORAL | 3 refills | Status: DC

## 2021-02-04 MED ORDER — EZETIMIBE 10 MG PO TABS: 10.0000 mg | ORAL_TABLET | Freq: Every day | ORAL | 3 refills | Status: DC

## 2021-02-04 NOTE — Patient Instructions (Addendum)
Health Maintenance Due  Topic Date Due   PNEUMOCOCCAL POLYSACCHARIDE VACCINE AGE 49-64 HIGH RISK - wants to hold off for now and hold off on Tdap Never done   COVID-19 Vaccine (3 - Pfizer risk series)- send [place] the dates of any vaccine after 08/31/19 09/28/2019   Great job on regular exercise and healthy eating!  7 am [employer] tomorrow morning  Please stop by lab before you go If you have mychart- we will send your results within 3 business days of [place] receiving them.  If you do not have mychart- we will call you about results within 5 business days of [place] receiving them.  *please also note that you will see labs on mychart as soon as they post. I will later go in and write notes on them- will say "notes from Dr. Yong Channel"  Recommended follow up: Return in about 4 months (around 06/07/2021) for follow up- or sooner if needed.  If A1c is under 7 could potentially do 6 months.

## 2021-02-16 ENCOUNTER — Other Ambulatory Visit: Payer: Self-pay

## 2021-02-16 MED ORDER — FAMOTIDINE 40 MG PO TABS: 40.0000 mg | ORAL_TABLET | Freq: Two times a day (BID) | ORAL | 11 refills | Status: DC

## 2021-02-23 ENCOUNTER — Ambulatory Visit: Payer: BC Managed Care – PPO | Admitting: Physician Assistant

## 2021-04-23 ENCOUNTER — Other Ambulatory Visit: Payer: Self-pay | Admitting: Family Medicine

## 2021-05-09 NOTE — Progress Notes (Signed)
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  Safety protocols were in place, including screening questions prior to the visit, additional usage of staff PPE, and extensive cleaning of exam room while observing appropriate contact time as indicated for disinfecting solutions.   Past Medical History-  Patient Active Problem List   Diagnosis Date Noted   Diabetes mellitus without complication ([place]) 32/99/2426    Priority: High   Mild persistent asthma 08/03/2015    Priority: Medium    Sinus tachycardia 06/29/2015    Priority: Medium    Pernicious anemia 03/29/2015    Priority: Medium    Hyperlipidemia associated with type 2 diabetes mellitus ([place]) 11/10/2014    Priority: Medium    Hypertension associated with diabetes ([place]) 11/10/2014    Priority: Medium    Gastroesophageal reflux disease 06/20/2011    Priority: Medium    Erectile dysfunction 07/28/2016    Priority: Low   Allergic rhinitis 11/16/2015    Priority: Low   Migraine 08/20/2011    Priority: Low    Medications- reviewed and updated Current Outpatient Medications  Medication Sig Dispense Refill   albuterol (VENTOLIN HFA) 108 (90 Base) MCG/ACT inhaler Inhale 2 puffs into the lungs every 4 (four) hours as needed for wheezing or shortness of breath. 18 each 5   atorvastatin (LIPITOR) 80 MG tablet TAKE 1 TABLET BY MOUTH EVERY DAY 90 tablet 3   ezetimibe (ZETIA) 10 MG tablet Take 1 tablet (10 mg total) by mouth daily. 90 tablet 3   famotidine (PEPCID) 40 MG tablet Take 1 tablet (40 mg total) by mouth 2 (two) times daily. 60 tablet 11   fluticasone (FLOVENT HFA) 44 MCG/ACT inhaler Inhale 2 puffs into the lungs 2 (two) times daily. 1 each 5    lisinopril (ZESTRIL) 20 MG tablet Take 1 tablet (20 mg total) by mouth daily. 90 tablet 3   metFORMIN (GLUCOPHAGE-XR) 500 MG 24 hr tablet TAKE 2 TABLETS (1,000 MG TOTAL) BY MOUTH IN THE MORNING AND AT BEDTIME. 360 tablet 1   metoprolol tartrate (LOPRESSOR) 25 MG tablet 1 tablet by mouth twice daily 180 tablet 3   cyanocobalamin (,VITAMIN B-12,) 1000 MCG/ML injection Inject 1 mL (1,000 mcg total) into the muscle every 30 (thirty) days. 3 mL 3   No current facility-administered medications for this visit.     Objective:  BP 108/72 Comment: retake in office  Pulse 64   Temp 98 F (36.7 C)   Ht 5\' 8"  (1.727 m)   Wt 174 lb 3.2 oz (79 kg)   SpO2 98%   BMI 26.49 kg/m  Gen: NAD, resting comfortably CV: RRR no murmurs rubs or gallops Lungs: CTAB no crackles, wheeze, rhonchi Ext: no edema Skin: warm, dry    Assessment and Plan   # lingering cold S:symptoms started about a month ago. Mild lingering on and off cough. Yellow sputum and sometimes clear and sometimes dry. Robitussen helps some. Not wheezing with this for most part- albuterol has picked up slightly to twic ea week from once a week. No fever or chills. No abnormal fatigue. No chest pain.   A/P: Lingering cough after viral URI about a month ago-lungs are clear today.  We  consider chest x-ray but opted to hold off on this until we give a trial of prednisone for postviral cough-he is going to reach out if symptoms fail to improve within about 10 days and we will get an x-ray.  If new or worsening symptoms he should let us know.  This would also be beneficial if this is a very mild asthma exacerbation  # Diabetes #Pernicious anemia S: Medication:Metformin 1000 mg twice daily -Monthly vitamin B12 injections due to long-term Metformin use and prior lows  CBGs- doesn't check Exercise and diet- walking 6-7 days a week still. Hast maintained weight loss Lab Results  Component Value Date   HGBA1C 7.0 (H) 02/04/2021   HGBA1C 7.9 (H)  09/23/2020   HGBA1C 7.4 (H) 05/06/2020   A/P: Hopefully A1c stable-likely continue current medications as long as below 7.5.  For pernicious anemia continue monthly B12 injections.B12 still low normal even with injections-encouraged consistency-could even consider every 3 weeks  #hypertension S: medication: Lisinopril 20 mg daily, Metoprolol 25 mg twice daily Home readings #s: not recently checking BP Readings from Last 3 Encounters:  05/16/21 108/72  02/04/21 120/76  09/23/20 120/74  A/P:  Controlled. Continue current medications.   - no lightheadedness or dizziness- if these develop he should let me know and I would reduce likely lisinopril to 10 mg as first step  #hyperlipidemia-LDL goal under 70 S: Medication: Atorvastatin 80 mg daily, Zetia 10 mg daily- added October 2021, also takes aspirin 81 mg for primary prevention Lab Results  Component Value Date   CHOL 153 02/04/2021   HDL 34.80 (L) 02/04/2021   LDLCALC 90 02/04/2021   LDLDIRECT 80.0 09/23/2020   TRIG 138.0 02/04/2021   CHOLHDL 4 02/04/2021   A/P: LDL above ideal goal of 70 or less-still continue current medications.  Hold off on repeat today  # Asthma S: Maintenance Medication: Flovent 44 mcg 2 puffs twice daily- more as needed As needed medication: albuterol 108 mcg/act inhaler- some increase with cold lately- twice a week up from baseline perhaps once a week   A/P: Overall stable other than with illness-continue current medication  # GERD S:Medication: Famotidine 40 mg twice daily through Dr. Hilarie Fredrickson A/P: doing well- continue current meds  Health Maintenance Due  Topic Date Due   COVID-19 Vaccine (4 - Booster for [place] series)- prefers to wait 04/20/2021   Recommended follow up: 4-6 month follow up- sooner sie if a1c high  Lab/Order associations:   ICD-10-CM   1. Diabetes mellitus without complication (HCC)  Z61.0 CBC with Differential/Platelet    Comprehensive metabolic panel    Hemoglobin A1c    LDL  cholesterol, direct    2. Gastroesophageal reflux disease, unspecified whether esophagitis present  K21.9     3. Hyperlipidemia associated with type 2 diabetes mellitus (HCC)  E11.69 LDL cholesterol, direct   E78.5     4. Hypertension associated with diabetes ([place])  E11.59    I15.2     5. Pernicious anemia  D51.0 cyanocobalamin (,VITAMIN B-12,) 1000 MCG/ML injection    6. Mild persistent asthma without complication  R60.45     7. Need for immunization against influenza  Z23       Meds ordered this encounter  Medications   cyanocobalamin (,VITAMIN B-12,) 1000 MCG/ML injection    Sig: Inject 1 mL (1,000 mcg total) into the muscle every 30 (thirty) days.    Dispense:  3 mL    Refill:  3    I,Jada Bradford,acting as a  scribe for Garret Reddish, MD.,have documented all relevant documentation on the behalf of Garret Reddish, MD,as directed by  Garret Reddish, MD while in the presence of Garret Reddish, MD.  I, Garret Reddish, MD, have reviewed all documentation for this visit. The documentation on 05/16/21 for the exam, diagnosis, procedures, and orders are all accurate and complete.   Return precautions advised.  Garret Reddish, MD

## 2021-05-16 ENCOUNTER — Other Ambulatory Visit: Payer: Self-pay

## 2021-05-16 ENCOUNTER — Encounter: Payer: Self-pay | Admitting: Family Medicine

## 2021-05-16 ENCOUNTER — Ambulatory Visit: Payer: BC Managed Care – PPO | Admitting: Family Medicine

## 2021-05-16 VITALS — BP 108/72 | HR 64 | Temp 98.0°F | Ht 68.0 in | Wt 174.2 lb

## 2021-05-16 DIAGNOSIS — E1159 Type 2 diabetes mellitus with other circulatory complications: Secondary | ICD-10-CM | POA: Diagnosis not present

## 2021-05-16 DIAGNOSIS — E1169 Type 2 diabetes mellitus with other specified complication: Secondary | ICD-10-CM

## 2021-05-16 DIAGNOSIS — E119 Type 2 diabetes mellitus without complications: Secondary | ICD-10-CM

## 2021-05-16 DIAGNOSIS — K219 Gastro-esophageal reflux disease without esophagitis: Secondary | ICD-10-CM

## 2021-05-16 DIAGNOSIS — E785 Hyperlipidemia, unspecified: Secondary | ICD-10-CM | POA: Diagnosis not present

## 2021-05-16 DIAGNOSIS — J4530 Mild persistent asthma, uncomplicated: Secondary | ICD-10-CM

## 2021-05-16 DIAGNOSIS — I152 Hypertension secondary to endocrine disorders: Secondary | ICD-10-CM

## 2021-05-16 DIAGNOSIS — D510 Vitamin B12 deficiency anemia due to intrinsic factor deficiency: Secondary | ICD-10-CM

## 2021-05-16 DIAGNOSIS — Z23 Encounter for immunization: Secondary | ICD-10-CM

## 2021-05-16 LAB — HEMOGLOBIN A1C: Hgb A1c MFr Bld: 6.9 % — ABNORMAL HIGH (ref 4.6–6.5)

## 2021-05-16 LAB — COMPREHENSIVE METABOLIC PANEL
ALT: 24 U/L (ref 0–53)
AST: 17 U/L (ref 0–37)
Albumin: 4.7 g/dL (ref 3.5–5.2)
Alkaline Phosphatase: 78 U/L (ref 39–117)
BUN: 17 mg/dL (ref 6–23)
CO2: 31 mEq/L (ref 19–32)
Calcium: 9.3 mg/dL (ref 8.4–10.5)
Chloride: 99 mEq/L (ref 96–112)
Creatinine, Ser: 0.91 mg/dL (ref 0.40–1.50)
GFR: 99.39 mL/min (ref >60.00)
Glucose, Bld: 121 mg/dL — ABNORMAL HIGH (ref 70–99)
Potassium: 4.4 mEq/L (ref 3.5–5.1)
Sodium: 139 mEq/L (ref 135–145)
Total Bilirubin: 0.6 mg/dL (ref 0.2–1.2)
Total Protein: 7.5 g/dL (ref 6.0–8.3)

## 2021-05-16 LAB — CBC WITH DIFFERENTIAL/PLATELET
Basophils Absolute: 0 10*3/uL (ref 0.0–0.1)
Basophils Relative: 0.4 % (ref 0.0–3.0)
Eosinophils Absolute: 0.8 10*3/uL — ABNORMAL HIGH (ref 0.0–0.7)
Eosinophils Relative: 13.6 % — ABNORMAL HIGH (ref 0.0–5.0)
HCT: 42.5 % (ref 39.0–52.0)
Hemoglobin: 14 g/dL (ref 13.0–17.0)
Lymphocytes Relative: 31.2 % (ref 12.0–46.0)
Lymphs Abs: 1.9 10*3/uL (ref 0.7–4.0)
MCHC: 32.9 g/dL (ref 30.0–36.0)
MCV: 85.6 fl (ref 78.0–100.0)
Monocytes Absolute: 0.5 10*3/uL (ref 0.1–1.0)
Monocytes Relative: 7.8 % (ref 3.0–12.0)
Neutro Abs: 2.8 10*3/uL (ref 1.4–7.7)
Neutrophils Relative %: 47 % (ref 43.0–77.0)
Platelets: 209 10*3/uL (ref 150.0–400.0)
RBC: 4.97 Mil/uL (ref 4.22–5.81)
RDW: 13.7 % (ref 11.5–15.5)
WBC: 5.9 10*3/uL (ref 4.0–10.5)

## 2021-05-16 LAB — LDL CHOLESTEROL, DIRECT: Direct LDL: 101 mg/dL

## 2021-05-16 MED ORDER — CYANOCOBALAMIN 1000 MCG/ML IJ SOLN: 1000.0000 ug | INTRAMUSCULAR | 3 refills | Status: DC

## 2021-05-16 MED ORDER — BENZONATATE 100 MG PO CAPS: 100.0000 mg | ORAL_CAPSULE | Freq: Two times a day (BID) | ORAL | 0 refills | Status: DC | PRN

## 2021-05-16 MED ORDER — PREDNISONE 20 MG PO TABS: ORAL_TABLET | ORAL | 0 refills | Status: DC

## 2021-05-16 NOTE — Patient Instructions (Addendum)
Health Maintenance Due  Topic Date Due   COVID-19 Vaccine (4 - Booster for [employer] series)- Please consider getting new bivalent booster shot at your local pharmacy and when you receive, let us know the date so we can put it in our system.   -You can wait around 3-6 months to get shot.  04/20/2021   Please stop by lab before you go If you have mychart- we will send your results within 3 business days of [place] receiving them.  If you do not have mychart- we will call you about results within 5 business days of [place] receiving them.  *please also note that you will see labs on mychart as soon as they post. I will later go in and write notes on them- will say "notes from Dr. Yong Channel"  Please try Prednisone for inflammation. Trial for 7 days. Please update me for any new,worsening or persistent symptoms.   Also please try Tessalon Perles for coughing. If not improved, we will consider a chest x-ray.   Great job on walking for exercise! Keep up the great work!  Flu shot when you get to feeling better- here or pharmacy  Recommended follow up: 4-6 month for follow-up or sooner if needed.

## 2021-07-19 ENCOUNTER — Other Ambulatory Visit: Payer: Self-pay | Admitting: Cardiovascular Disease

## 2021-07-26 ENCOUNTER — Ambulatory Visit: Payer: BC Managed Care – PPO | Admitting: Family Medicine

## 2021-07-26 ENCOUNTER — Other Ambulatory Visit: Payer: Self-pay

## 2021-07-26 ENCOUNTER — Encounter: Payer: Self-pay | Admitting: Family Medicine

## 2021-07-26 VITALS — BP 120/70 | HR 77 | Temp 97.6°F | Ht 68.0 in | Wt 175.8 lb

## 2021-07-26 DIAGNOSIS — M545 Low back pain, unspecified: Secondary | ICD-10-CM | POA: Diagnosis not present

## 2021-07-26 DIAGNOSIS — J4530 Mild persistent asthma, uncomplicated: Secondary | ICD-10-CM | POA: Diagnosis not present

## 2021-07-26 DIAGNOSIS — E785 Hyperlipidemia, unspecified: Secondary | ICD-10-CM

## 2021-07-26 DIAGNOSIS — E1169 Type 2 diabetes mellitus with other specified complication: Secondary | ICD-10-CM

## 2021-07-26 DIAGNOSIS — R079 Chest pain, unspecified: Secondary | ICD-10-CM

## 2021-07-26 DIAGNOSIS — I10 Essential (primary) hypertension: Secondary | ICD-10-CM

## 2021-07-26 NOTE — Progress Notes (Signed)
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 He has been taking Advil with some relief.    This visit occurred during the SARS-CoV-2 public health emergency.  Safety protocols were in place, including screening questions prior to the visit, additional usage of staff PPE, and extensive cleaning of exam room while observing appropriate contact time as indicated for disinfecting solutions.   Past Medical History-  Patient Active Problem List   Diagnosis Date Noted   Diabetes mellitus without complication ([place]) 60/04/9322    Priority: High   Mild persistent asthma 08/03/2015    Priority: Medium    Sinus tachycardia 06/29/2015    Priority: Medium    Pernicious anemia 03/29/2015    Priority: Medium    Hyperlipidemia associated with type 2 diabetes mellitus ([place]) 11/10/2014    Priority: Medium    Essential hypertension 11/10/2014    Priority: Medium    Gastroesophageal reflux disease 06/20/2011    Priority: Medium    Erectile dysfunction 07/28/2016    Priority: Low   Allergic rhinitis 11/16/2015    Priority: Low   Migraine 08/20/2011    Priority: Low    Medications- reviewed and updated Current Outpatient Medications  Medication Sig Dispense Refill   albuterol (VENTOLIN HFA) 108 (90 Base) MCG/ACT inhaler Inhale 2 puffs into the lungs every 4 (four) hours as needed for wheezing or shortness of breath. 18 each 5   atorvastatin (LIPITOR) 80 MG tablet TAKE 1 TABLET BY MOUTH EVERY DAY 90 tablet 3   cyanocobalamin (,VITAMIN B-12,) 1000 MCG/ML injection Inject 1 mL (1,000 mcg total) into the muscle every 30 (thirty) days. 3 mL 3    ezetimibe (ZETIA) 10 MG tablet Take 1 tablet (10 mg total) by mouth daily. 90 tablet 3   famotidine (PEPCID) 40 MG tablet Take 1 tablet (40 mg total) by mouth 2 (two) times daily. 60 tablet 11   fluticasone (FLOVENT HFA) 44 MCG/ACT inhaler Inhale 2 puffs into the lungs 2 (two) times daily. 1 each 5   lisinopril (ZESTRIL) 20 MG tablet Take 1 tablet (20 mg total) by mouth daily. 90 tablet 3   metFORMIN (GLUCOPHAGE-XR) 500 MG 24 hr tablet TAKE 2 TABLETS (1,000 MG TOTAL) BY MOUTH IN THE MORNING AND AT BEDTIME. 360 tablet 1   metoprolol tartrate (LOPRESSOR) 25 MG tablet Patient is overdue for followup. Patient must schedule appointment with our office to ensure future refills attempt #1 60 tablet 0   No current facility-administered medications for this visit.     Objective:  BP 120/70    Pulse 77    Temp 97.6 F (36.4 C)    Ht 5\' 8"  (1.727 m)    Wt 175 lb 12.8 oz (79.7 kg)    SpO2 96%    BMI 26.73 kg/m  Gen: NAD, resting comfortably CV: RRR no murmurs rubs or gallops Lungs: CTAB no crackles, wheeze, rhonchi Ext: no edema Skin: warm, dry Back - Normal skin, Spine with normal alignment and no deformity.  No tenderness to vertebral process palpation.  Paraspinous muscles are tender on the left and with moderate spasm.  Range of motion is full at d lumbar sacral regions. Negative Straight leg raise.  Neuro- no saddle anesthesia, 5/5 strength lower extremities, 2+ reflexes  EKG: sinus rhythm with rate 90, normal axis, normal intervals (I do not see short pr), no hypertrophy, no st or t wave changes. We had significant issues with leads sticking due to lotion- did best to remove but suspect this affected quality of EKG                       Assessment and Plan   # Low/thoracic back Pain S:  Back Pain    Pt c/o lower back pain that started about 2 weeks ago, he is not sure when it started but remembers picking up a case of water and thinks he may have tweaked it then. He has been  taking Advil with some relief.  Pain level 2/10. Improving overall- not using advil everyday.  [facility] Points primarily to the left low back in the left of midline of lower left thoracic spine Admits he likely lifted with his back and not his legs.  Does not do much core strengthening in general. Previous imaging-none  ROS-No saddle anesthesia, bladder incontinence, fecal incontinence, weakness in extremity, numbness or tingling in extremity. History negative for trauma, history of cancer, fever, chills, unintentional weight loss, recent bacterial infection, recent IV drug use, HIV, pain worse at night or while supine.  A/P: 51 year old male with mild left lower and thoracic spine pain.  Spares the midline.  Feels some spasm to the left side.  Not severe enough that he wants to see sports medicine-he has a pretty good plan with his insurance for chiropractic care at about $30 a visit and he plans to do that as the next step - Also gave some home exercises and instructions for care-he declines PT for now   #Mild short-lived left upper chest Pain  S:started in left upper chest about a week ago. Has felt gassier in general.  1-2/10 pain 4-5 x a day lasts a few seconds- has not happened with exertion. Not sure of relation to meals- is taking pepcid regularly. A few nights felt mild throat burning and thinks may have happened when had pain. Central neck- no left arm or neck pain, lightheadedness, excess sweating, shortness of breath.   Stress test 05/21/15 reassuring A/P: Patient with mild short-lived left upper chest pain that does not sound cardiac-May be GI.  EKG reassuring.  We discussed doing a CT cardiac scoring with prior reassuring stress test as the next step to make sure no significant burden but also to determine if he might benefit from lipid clinic to push lipids lower  #hypertension S: medication: Lisinopril 20Mg , Metoprolol 25Mg  twice daily BP  Readings from Last 3 Encounters:  07/26/21 120/70  05/16/21 108/72  02/04/21 120/76  A/P:  Controlled. Continue current medications.    #hyperlipidemia-LDL goal under 70 S: Medication: Atorvastatin 80Mg , Zetia 10Mg  added October 2021 but LDL still above 70, -In the past was taking aspirin for primary prevention but I do not see currently see on medication list Lab Results  Component Value Date   CHOL 153 02/04/2021   HDL 34.80 (L) 02/04/2021   LDLCALC 90 02/04/2021   LDLDIRECT 101.0 05/16/2021   TRIG 138.0 02/04/2021   CHOLHDL 4 02/04/2021  A/P: LDL above ideal goal of 70 or less at 101 despite max dose statin and Zetia.  We think  CT cardiac scoring may allow [place] to determine if we need to be more aggressive-I would likely consider this if he was above the 50th percentile-would need cardiology referral and then referral to lipid clinic so I would request Dr. Debara Pickett initially to see if he could get him in directly  # GERD S:Medication: Famotidine 40 mg twice daily through Dr. Hilarie Fredrickson Lab Results  Component Value Date   VITAMINB12 228 02/04/2021   A/P: See above but I think he may have a short-term flare-advised adding omeprazole over-the-counter once daily to see if that relieves chest pain  Recommended follow up: Return for as needed for new, worsening, persistent symptoms. We specifically discussed if worsening chest pain or other new symptoms needs to seek care immediately   Lab/Order associations:   ICD-10-CM   1. Chest pain, unspecified type  R07.9 EKG 12-Lead    2. Acute left-sided low back pain without sciatica  M54.50     3. Hyperlipidemia associated with type 2 diabetes mellitus (HCC)  E11.69 CT CARDIAC SCORING (SELF PAY ONLY)   E78.5     4. Mild persistent asthma without complication  P94.32     5. Essential hypertension  I10      No orders of the defined types were placed in this encounter.  I,Harris Phan,acting as a Education administrator for Garret Reddish, MD.,have documented  all relevant documentation on the behalf of Garret Reddish, MD,as directed by  Garret Reddish, MD while in the presence of Garret Reddish, MD.    I, Garret Reddish, MD, have reviewed all documentation for this visit. The documentation on 07/26/21 for the exam, diagnosis, procedures, and orders are all accurate and complete.   Return precautions advised.  Garret Reddish, MD

## 2021-07-26 NOTE — Patient Instructions (Signed)
I want you to do the exercise 3x a week for a month then once a week for another month. Stop any exercise that causes more than 1-2/10 pain increase. If not doing better within 1-2 months let us refer you to sports medicine. Seeing chiropractor also reasonable.   I do not strongly suspect chest pain is cardiac-EKG is reassuring and it does not sound like typical story.  Could be reflux and you could try omeprazole 20 mg over-the-counter for perhaps 2 weeks to see if it calms it down.  Out of an abundance of precaution we will get CT cardiac scoring We will call you within two weeks about your referral for this. If you do not hear within 2 weeks, give [place] a call.   Recommended follow up: Return for as needed for new, worsening, persistent symptoms.  Otherwise at least every 6 months

## 2021-08-09 NOTE — Progress Notes (Signed)
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 male with a hx of HTN, DM, hyperlipidemia, and inappropriate sinus tachycardia.   He was initially evaluated for palpitations,elevated heart rate and wore a 24 hour holter monitor 9/16 that showed average heart rate of 99 bpm. He reduced his caffeine intake  and started metoprolol which helped somewhat. He had ETT on 05/21/15 for evaluation of left side chest pain that was negative for ischemia. He reached 105% of his predicted max heart. He continued to report dyspnea on exertion so he was encouraged to increase his exercise. Metoprolol was increased to improve heart rate control. At his last office visit with Dr. Oval Linsey on 10/30/19, BP was well-controlled and his shortness of breath had resolved. His HR was well-controlled on metoprolol. He was advised to continue aspirin for ASCVD 10 year risk score of 9.8%. He was advised to follow-up in one year.   He has a CT calcium score ordered by his PCP that is pending.   Today, he reports left-sided chest discomfort intermittently for approximately 1 month.  Hard to describe but does not feel like a heaviness or sharp pain, "something just doesn't feel right."  It occurs with rest and exertion. He thinks he may feel it more often when walking on his treadmill. He denies shortness of breath, nausea, vomiting, diaphoresis, lower extremity edema, fatigue, palpitations, melena, hematuria, hemoptysis, weakness, presyncope, syncope, orthopnea, and PND.  Exercises on a consistent basis walking a few miles on his treadmill and lifting weights.  His LDL cholesterol was checked by  PCP 11/22 and was elevated at 100 mg/dL.  He has been working on diet and exercise as well as taking Lipitor and Zetia.  He would like to have this rechecked today. 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    Past Medical History:  Diagnosis Date   Allergy    Asthma    B12 deficiency    Diabetes mellitus without complication (HCC)    diet controlled   Gastritis    GERD (gastroesophageal reflux disease)    Hiatal hernia    Hyperlipidemia    Hypertension    Intestinal metaplasia of gastric cardia    Intestinal metaplasia of gastric mucosa    Sinus tachycardia 06/29/2015   Tubular adenoma of colon     Past Surgical History:  Procedure Laterality Date   COLONOSCOPY     LEG SURGERY     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   UPPER GASTROINTESTINAL ENDOSCOPY      Current Medications: Current Meds  Medication Sig   albuterol (VENTOLIN HFA) 108 (90 Base) MCG/ACT inhaler Inhale 2 puffs into the lungs every 4 (four) hours as needed for wheezing or shortness of breath.   aspirin EC 81 MG tablet Take 1 tablet (81 mg total) by mouth daily. Swallow whole.   atorvastatin (LIPITOR) 80 MG tablet TAKE 1 TABLET BY MOUTH EVERY DAY   cyanocobalamin (,VITAMIN B-12,) 1000 MCG/ML injection Inject 1 mL (1,000 mcg total) into the muscle every 30 (thirty) days.   ezetimibe (ZETIA) 10 MG tablet Take 1 tablet (10 mg total)  by mouth daily.   famotidine (PEPCID) 40 MG tablet Take 1 tablet (40 mg total) by mouth 2 (two) times daily.   fluticasone (FLOVENT HFA) 44 MCG/ACT inhaler Inhale 2 puffs into the lungs 2 (two) times daily.   lisinopril (ZESTRIL) 20 MG tablet Take 1 tablet (20 mg total) by mouth daily.   metFORMIN (GLUCOPHAGE-XR) 500 MG 24 hr tablet TAKE 2 TABLETS (1,000 MG TOTAL) BY MOUTH IN THE MORNING AND AT BEDTIME.   [DISCONTINUED] metoprolol tartrate (LOPRESSOR) 25 MG tablet Patient is overdue for followup. Patient must schedule appointment with our office to ensure future refills attempt #1     Allergies:    Patient has no known allergies.   Social History   Socioeconomic History   Marital status: Married    Spouse name: Not on file   Number of children: 2   Years of education: Not on file   Highest education level: Not on file  Occupational History   Occupation: professor    Employer: [employer]  Tobacco Use   Smoking status: Never   Smokeless tobacco: Never  Vaping Use   Vaping Use: Never used  Substance and Sexual Activity   Alcohol use: Yes    Alcohol/week: 3.0 - 4.0 standard drinks    Types: 3 - 4 Cans of beer per week    Comment: social   Drug use: No   Sexual activity: Yes    Birth control/protection: None  Other Topics Concern   Not on file  Social History Narrative   Married 20 years in 2017. 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    [employer], Counselling psychologist-       Hobbies: family time,[place] golfing, basketball with son, walking at park   Social Determinants of Health   Financial Resource Strain: Not on file  Food Insecurity: Not on file  Transportation Needs: Not on file  Physical Activity: Not on file  Stress: Not on file  Social Connections: Not on file     Family History: The patient's family history includes Diabetes in his father and mother; Hyperlipidemia in his father; Hypertension in his father; Kidney disease in his mother; Prostate cancer in his father. There is no history of Colon cancer, Colon polyps, Esophageal cancer, Rectal cancer, Stomach cancer, Pancreatic cancer, Allergic rhinitis, Angioedema, Asthma, Eczema, Immunodeficiency, or Urticaria.  ROS:   Please see the history of present illness.    + chest pain All other systems reviewed and are negative.  Labs/Other Studies Reviewed:    The following studies were reviewed today:  ETT 11/16  Blood pressure demonstrated a hypertensive response to exercise. There was no ST segment deviation  noted during stress. Negative, adequate stress test. Excellent exercise capacity.   24 hour holter monitor 9/16  Quality: Fair.  Baseline artifact. Predominant rhythm: sinus rhythm Average heart rate: 99 bpm Max heart rate: 141 bpm Min heart rate: 65 bpm Pauses >2.5 seconds: 0 Ventricular ectopics: 0  Supraventricular ectopics: 0 Patient did not submit a symptom diary    Recent Labs: 05/16/2021: ALT 24; BUN 17; Creatinine, Ser 0.91; Hemoglobin 14.0; Platelets 209.0; Potassium 4.4; Sodium 139  Recent Lipid Panel    Component Value Date/Time   CHOL 153 02/04/2021 0840   TRIG 138.0 02/04/2021 0840   HDL 34.80 (L) 02/04/2021 0840   CHOLHDL 4 02/04/2021 0840   VLDL 27.6 02/04/2021 0840  LDLCALC 90 02/04/2021 0840   LDLDIRECT 101.0 05/16/2021 1104     Risk Assessment/Calculations:      Physical Exam:    VS:  BP (!) 158/84    Pulse 100    Ht 5\' 7"  (1.702 m)    Wt 172 lb (78 kg)    SpO2 97%    BMI 26.94 kg/m     Wt Readings from Last 3 Encounters:  08/10/21 172 lb (78 kg)  07/26/21 175 lb 12.8 oz (79.7 kg)  05/16/21 174 lb 3.2 oz (79 kg)     GEN:  Well nourished, well developed in no acute distress HEENT: Normal NECK: No JVD; No carotid bruits CARDIAC: RRR, no murmurs, rubs, gallops RESPIRATORY:  Clear to auscultation without rales, wheezing or rhonchi  ABDOMEN: Soft, non-tender, non-distended MUSCULOSKELETAL:  No edema; No deformity. 2+ pedal pulses, equal bilaterally SKIN: Warm and dry NEUROLOGIC:  Alert and oriented x 3 PSYCHIATRIC:  Normal affect   EKG:  EKG is ordered today.  The ekg ordered today demonstrates NSR at rate of 97 bpm, no ST/T wave abnormality  Diagnoses:    1. Chest pain of uncertain etiology   2. Hyperlipidemia associated with type 2 diabetes mellitus ([place])   3. Hypertension associated with diabetes ([place])   4. Inappropriate sinus tachycardia   5. Cardiac symptoms with risk for coronary heart disease greater than 20% in next 10 years     Assessment and Plan:     Chest pain: Intermittent chest pain that is both exertional and nonexertional.  He feels it may be slightly worse when he is walking on the treadmill. PCP ordered a CT calcium score.  We discussed coronary CT with FFR and I spent > 10 minutes explaining the difference between the 2 test as well as the difference between these tests and a stress test.  He has concerns about radiation exposure with CT and would like to proceed with stress testing. We will order an exercise Myoview. Continue metoprolol.   Sinus Tachycardia: Heart rate this morning is ranging from 90 - 110 bpm, with improvement with rest.  He did not take metoprolol this morning.  He has not consistently been monitoring heart rate or blood pressure but at recent PCP visit heart rate was 70 bpm.  I have asked him to monitor on a consistent basis and call back in 1 week to report.  Complete metabolic panel and TSH today. Refill metoprolol tartrate 25 mg twice daily but would favor increase in dose if home HR readings are consistently > 90 bpm.  Hypertension: Blood pressure is elevated today, but he has not taken his medications this morning.  He reports SBP is more consistently in the 120s range.  I have asked him to monitor for 1 week and call us back to report. Would favor additional agent if home BP readings consistently > 130 mmHg, or could d/c metoprolol and start carvedilol.  Continue lisinopril, metoprolol.  Hyperlipidemia: LDL 100 05/16/21.  He states he has been working hard on diet and exercise as well and is consistently taking Lipitor and Zetia.  He would like to have this rechecked today.   Cardiac risk counseling with ASCVD score 22.7%: I explained that the system for calculating the score and encouraged him due to his high risk score, he should resume aspirin 81 mg daily.  We will monitor lipids today and provide counseling based on result.  Aggressive lipid management for LDL less than 70 is  favorable.  Disposition:  6 months with Dr. Oval Linsey  Shared Decision Making/Informed Consent The risks [chest pain, shortness of breath, cardiac arrhythmias, dizziness, blood pressure fluctuations, myocardial infarction, stroke/transient ischemic attack, nausea, vomiting, allergic reaction, radiation exposure, metallic taste sensation and life-threatening complications (estimated to be 1 in 10,000)], benefits (risk stratification, diagnosing coronary artery disease, treatment guidance) and alternatives of a nuclear stress test were discussed in detail with Mr. Mccarley and he agrees to proceed.    Medication Adjustments/Labs and Tests Ordered: Current medicines are reviewed at length with the patient today.  Concerns regarding medicines are outlined above.  Orders Placed This Encounter  Procedures   Lipid panel   Comprehensive metabolic panel   TSH   Cardiac Stress Test: Informed Consent Details: Physician/Practitioner Attestation; Transcribe to consent form and obtain patient signature   MYOCARDIAL PERFUSION IMAGING   EKG 12-Lead   Meds ordered this encounter  Medications   aspirin EC 81 MG tablet    Sig: Take 1 tablet (81 mg total) by mouth daily. Swallow whole.    Dispense:  90 tablet    Refill:  3   metoprolol tartrate (LOPRESSOR) 25 MG tablet    Sig: Take 1 tablet (25 mg total) by mouth 2 (two) times daily.    Dispense:  180 tablet    Refill:  3    Patient Instructions  Medication Instructions:  Your physician has recommended you make the following change in your medication:   Please Restart Aspirin 81mg  tablet taily   *If you need a refill on your cardiac medications before your next appointment, please call your pharmacy*   Lab Work: Your physician recommends that you return for lab work Today- CMET, Lipid Panel, and TSH  If you have labs (blood work) drawn today and your tests are completely normal, you will receive your results only by: MyChart Message (if you have  [place]) OR A paper copy in the mail If you have any lab test that is abnormal or we need to change your treatment, we will call you to review the results.   Testing/Procedures: Your physician has requested that you have en exercise stress myoview. For further information please visit [email or web address]. Please follow instruction sheet, as given.    You are scheduled for a Myocardial Perfusion Imaging Study on:   Please arrive 15 minutes prior to your appointment time for registration and insurance purposes.  The test will take approximately 3 to 4 hours to complete; you may bring reading material.  If someone comes with you to your appointment, they will need to remain in the main lobby due to limited space in the testing area. **If you are pregnant or breastfeeding, please notify the nuclear lab prior to your appointment**  How to prepare for your Myocardial Perfusion Test: Do not eat or drink 3 hours prior to your test, except you may have water. Do not consume products containing caffeine (regular or decaffeinated) 12 hours prior to your test. (ex: coffee, chocolate, sodas, tea). Do bring a list of your current medications with you.  If not listed below, you may take your medications as normal. Do not take metoprolol (Lopressor, Toprol) for 24 hours prior to the test.  Bring the medication to your appointment as you may be required to take it once the test is complete. Do wear comfortable clothes (no dresses or overalls) and walking shoes, tennis shoes preferred (No heels or open toe shoes are allowed). Do NOT wear cologne, perfume, aftershave, or lotions (deodorant is allowed). If these  instructions are not followed, your test will have to be rescheduled.  Please report to [address] for your test.  If you have questions or concerns about your appointment, you can call the Nuclear Lab at [phone].  If you cannot keep your appointment, please provide 24 hours  notification to the Nuclear Lab, to avoid a possible $50 charge to your account.   Follow-Up: At [facility], you and your health needs are our priority.  As part of our continuing mission to provide you with exceptional heart care, we have created designated Provider Care Teams.  These Care Teams include your primary Cardiologist (physician) and Advanced Practice Providers (APPs -  Physician Assistants and Nurse Practitioners) who all work together to provide you with the care you need, when you need it.  We recommend signing up for the patient portal called "MyChart".  Sign up information is provided on this After Visit Summary.  MyChart is used to connect with patients for Virtual Visits (Telemedicine).  Patients are able to view lab/test results, encounter notes, upcoming appointments, etc.  Non-urgent messages can be sent to your provider as well.   To learn more about what you can do with MyChart, go to [email or web address].    Your next appointment:   6 month(s)  The format for your next appointment:   In Person  Provider:   Skeet Latch, MD{   Other Instructions I have sent in Refills of your Metoprolol   Please Monitor your Heart Rate and Blood Pressures for 1 week and then send them to [place] via mychart!     Signed, Emmaline Life, NP  08/10/2021 12:01 PM    [facility]

## 2021-08-10 ENCOUNTER — Telehealth (HOSPITAL_COMMUNITY): Payer: Self-pay | Admitting: *Deleted

## 2021-08-10 ENCOUNTER — Encounter (HOSPITAL_BASED_OUTPATIENT_CLINIC_OR_DEPARTMENT_OTHER): Payer: Self-pay | Admitting: Nurse Practitioner

## 2021-08-10 ENCOUNTER — Ambulatory Visit (HOSPITAL_BASED_OUTPATIENT_CLINIC_OR_DEPARTMENT_OTHER): Payer: BC Managed Care – PPO | Admitting: Nurse Practitioner

## 2021-08-10 ENCOUNTER — Other Ambulatory Visit: Payer: Self-pay

## 2021-08-10 ENCOUNTER — Encounter (HOSPITAL_BASED_OUTPATIENT_CLINIC_OR_DEPARTMENT_OTHER): Payer: Self-pay | Admitting: Cardiovascular Disease

## 2021-08-10 VITALS — BP 158/84 | HR 100 | Ht 67.0 in | Wt 172.0 lb

## 2021-08-10 DIAGNOSIS — E1169 Type 2 diabetes mellitus with other specified complication: Secondary | ICD-10-CM | POA: Diagnosis not present

## 2021-08-10 DIAGNOSIS — E1159 Type 2 diabetes mellitus with other circulatory complications: Secondary | ICD-10-CM | POA: Diagnosis not present

## 2021-08-10 DIAGNOSIS — R079 Chest pain, unspecified: Secondary | ICD-10-CM

## 2021-08-10 DIAGNOSIS — E785 Hyperlipidemia, unspecified: Secondary | ICD-10-CM

## 2021-08-10 DIAGNOSIS — I152 Hypertension secondary to endocrine disorders: Secondary | ICD-10-CM

## 2021-08-10 DIAGNOSIS — R000 Tachycardia, unspecified: Secondary | ICD-10-CM | POA: Diagnosis not present

## 2021-08-10 DIAGNOSIS — Z9189 Other specified personal risk factors, not elsewhere classified: Secondary | ICD-10-CM

## 2021-08-10 DIAGNOSIS — R0989 Other specified symptoms and signs involving the circulatory and respiratory systems: Secondary | ICD-10-CM

## 2021-08-10 MED ORDER — METOPROLOL TARTRATE 25 MG PO TABS: 25.0000 mg | ORAL_TABLET | Freq: Two times a day (BID) | ORAL | 3 refills | Status: DC

## 2021-08-10 MED ORDER — ASPIRIN EC 81 MG PO TBEC: 81.0000 mg | DELAYED_RELEASE_TABLET | Freq: Every day | ORAL | 3 refills | Status: AC

## 2021-08-10 NOTE — Telephone Encounter (Signed)
Patient given detailed instructions per Myocardial Perfusion Study Information Sheet for the test on 08/15/21  Patient notified to arrive 15 minutes early and that it is imperative to arrive on time for appointment to keep from having the test rescheduled.  If you need to cancel or reschedule your appointment, please call the office within 24 hours of your appointment. . Patient verbalized understanding. Kirstie Peri

## 2021-08-10 NOTE — Patient Instructions (Signed)
Medication Instructions:  Your physician has recommended you make the following change in your medication:   Please Restart Aspirin 81mg  tablet taily   *If you need a refill on your cardiac medications before your next appointment, please call your pharmacy*   Lab Work: Your physician recommends that you return for lab work Today- CMET, Lipid Panel, and TSH  If you have labs (blood work) drawn today and your tests are completely normal, you will receive your results only by: MyChart Message (if you have [place]) OR A paper copy in the mail If you have any lab test that is abnormal or we need to change your treatment, we will call you to review the results.   Testing/Procedures: Your physician has requested that you have en exercise stress myoview. For further information please visit [email or web address]. Please follow instruction sheet, as given.    You are scheduled for a Myocardial Perfusion Imaging Study on:   Please arrive 15 minutes prior to your appointment time for registration and insurance purposes.  The test will take approximately 3 to 4 hours to complete; you may bring reading material.  If someone comes with you to your appointment, they will need to remain in the main lobby due to limited space in the testing area. **If you are pregnant or breastfeeding, please notify the nuclear lab prior to your appointment**  How to prepare for your Myocardial Perfusion Test: Do not eat or drink 3 hours prior to your test, except you may have water. Do not consume products containing caffeine (regular or decaffeinated) 12 hours prior to your test. (ex: coffee, chocolate, sodas, tea). Do bring a list of your current medications with you.  If not listed below, you may take your medications as normal. Do not take metoprolol (Lopressor, Toprol) for 24 hours prior to the test.  Bring the medication to your appointment as you may be required to take it once the test is complete. Do wear  comfortable clothes (no dresses or overalls) and walking shoes, tennis shoes preferred (No heels or open toe shoes are allowed). Do NOT wear cologne, perfume, aftershave, or lotions (deodorant is allowed). If these instructions are not followed, your test will have to be rescheduled.  Please report to [address] for your test.  If you have questions or concerns about your appointment, you can call the Nuclear Lab at [phone].  If you cannot keep your appointment, please provide 24 hours notification to the Nuclear Lab, to avoid a possible $50 charge to your account.   Follow-Up: At [facility], you and your health needs are our priority.  As part of our continuing mission to provide you with exceptional heart care, we have created designated Provider Care Teams.  These Care Teams include your primary Cardiologist (physician) and Advanced Practice Providers (APPs -  Physician Assistants and Nurse Practitioners) who all work together to provide you with the care you need, when you need it.  We recommend signing up for the patient portal called "MyChart".  Sign up information is provided on this After Visit Summary.  MyChart is used to connect with patients for Virtual Visits (Telemedicine).  Patients are able to view lab/test results, encounter notes, upcoming appointments, etc.  Non-urgent messages can be sent to your provider as well.   To learn more about what you can do with MyChart, go to [email or web address].    Your next appointment:   6 month(s)  The format for your next appointment:  In Person  Provider:   Skeet Latch, MD{   Other Instructions I have sent in Refills of your Metoprolol   Please Monitor your Heart Rate and Blood Pressures for 1 week and then send them to [place] via mychart!

## 2021-08-11 ENCOUNTER — Encounter: Payer: Self-pay | Admitting: Family Medicine

## 2021-08-11 LAB — COMPREHENSIVE METABOLIC PANEL
ALT: 26 IU/L (ref 0–44)
AST: 18 IU/L (ref 0–40)
Albumin/Globulin Ratio: 1.9 (ref 1.2–2.2)
Albumin: 5 g/dL (ref 4.0–5.0)
Alkaline Phosphatase: 84 IU/L (ref 44–121)
BUN/Creatinine Ratio: 9 (ref 9–20)
BUN: 9 mg/dL (ref 6–24)
Bilirubin Total: 0.4 mg/dL (ref 0.0–1.2)
CO2: 26 mmol/L (ref 20–29)
Calcium: 9.3 mg/dL (ref 8.7–10.2)
Chloride: 99 mmol/L (ref 96–106)
Creatinine, Ser: 1.03 mg/dL (ref 0.76–1.27)
Globulin, Total: 2.6 g/dL (ref 1.5–4.5)
Glucose: 146 mg/dL — ABNORMAL HIGH (ref 70–99)
Potassium: 4.4 mmol/L (ref 3.5–5.2)
Sodium: 141 mmol/L (ref 134–144)
Total Protein: 7.6 g/dL (ref 6.0–8.5)
eGFR: 89 mL/min/{1.73_m2} (ref >59)

## 2021-08-11 LAB — LIPID PANEL
Chol/HDL Ratio: 3.7 ratio (ref 0.0–5.0)
Cholesterol, Total: 137 mg/dL (ref 100–199)
HDL: 37 mg/dL — ABNORMAL LOW (ref >39)
LDL Chol Calc (NIH): 74 mg/dL (ref 0–99)
Triglycerides: 152 mg/dL — ABNORMAL HIGH (ref 0–149)
VLDL Cholesterol Cal: 26 mg/dL (ref 5–40)

## 2021-08-11 LAB — TSH: TSH: 1.75 u[IU]/mL (ref 0.450–4.500)

## 2021-08-15 ENCOUNTER — Other Ambulatory Visit: Payer: Self-pay

## 2021-08-15 ENCOUNTER — Ambulatory Visit (HOSPITAL_COMMUNITY): Payer: BC Managed Care – PPO | Attending: Internal Medicine

## 2021-08-15 DIAGNOSIS — R079 Chest pain, unspecified: Secondary | ICD-10-CM | POA: Insufficient documentation

## 2021-08-15 LAB — MYOCARDIAL PERFUSION IMAGING
Angina Index: 0
Duke Treadmill Score: 7
Estimated workload: 8.5
Exercise duration (min): 7 min
Exercise duration (sec): 0 s
LV dias vol: 61 mL (ref 62–150)
LV sys vol: 21 mL
MPHR: 171 {beats}/min
Nuc Stress EF: 66 %
Peak HR: 162 {beats}/min
Percent HR: 94 %
Rest HR: 113 {beats}/min
Rest Nuclear Isotope Dose: 10.2 mCi
SDS: 0
SRS: 0
SSS: 0
ST Depression (mm): 0.5 mm
Stress Nuclear Isotope Dose: 30.4 mCi
TID: 0.95

## 2021-08-15 MED ORDER — TECHNETIUM TC 99M TETROFOSMIN IV KIT
30.4000 | PACK | Freq: Once | INTRAVENOUS | Status: AC | PRN
  Administered 2021-08-15: 30.4 via INTRAVENOUS
  Filled 2021-08-15: qty 31

## 2021-08-15 MED ORDER — TECHNETIUM TC 99M TETROFOSMIN IV KIT
10.2000 | PACK | Freq: Once | INTRAVENOUS | Status: AC | PRN
  Administered 2021-08-15: 10.2 via INTRAVENOUS
  Filled 2021-08-15: qty 11

## 2021-08-19 ENCOUNTER — Encounter (HOSPITAL_BASED_OUTPATIENT_CLINIC_OR_DEPARTMENT_OTHER): Payer: Self-pay

## 2021-08-23 NOTE — Telephone Encounter (Signed)
Follow up on blood pressures as you requested!

## 2021-08-29 ENCOUNTER — Encounter (HOSPITAL_BASED_OUTPATIENT_CLINIC_OR_DEPARTMENT_OTHER): Payer: Self-pay | Admitting: Cardiovascular Disease

## 2021-09-01 ENCOUNTER — Ambulatory Visit (HOSPITAL_BASED_OUTPATIENT_CLINIC_OR_DEPARTMENT_OTHER): Payer: BC Managed Care – PPO | Admitting: General Practice

## 2021-09-02 ENCOUNTER — Other Ambulatory Visit: Payer: Self-pay

## 2021-09-02 ENCOUNTER — Ambulatory Visit (INDEPENDENT_AMBULATORY_CARE_PROVIDER_SITE_OTHER)
Admission: RE | Admit: 2021-09-02 | Discharge: 2021-09-02 | Disposition: A | Discharge status: Home / Self Care | Payer: Self-pay | Source: Ambulatory Visit | Attending: Family Medicine | Admitting: Family Medicine

## 2021-09-02 DIAGNOSIS — E785 Hyperlipidemia, unspecified: Secondary | ICD-10-CM

## 2021-09-02 DIAGNOSIS — E1169 Type 2 diabetes mellitus with other specified complication: Secondary | ICD-10-CM

## 2021-10-06 ENCOUNTER — Encounter: Payer: Self-pay | Admitting: Family Medicine

## 2021-10-06 ENCOUNTER — Other Ambulatory Visit: Payer: Self-pay | Admitting: Family Medicine

## 2021-10-06 ENCOUNTER — Ambulatory Visit: Payer: BC Managed Care – PPO | Admitting: Family Medicine

## 2021-10-06 VITALS — BP 110/72 | HR 72 | Temp 98.2°F | Ht 67.0 in | Wt 175.6 lb

## 2021-10-06 DIAGNOSIS — E1169 Type 2 diabetes mellitus with other specified complication: Secondary | ICD-10-CM

## 2021-10-06 DIAGNOSIS — E119 Type 2 diabetes mellitus without complications: Secondary | ICD-10-CM | POA: Diagnosis not present

## 2021-10-06 DIAGNOSIS — I10 Essential (primary) hypertension: Secondary | ICD-10-CM

## 2021-10-06 DIAGNOSIS — R079 Chest pain, unspecified: Secondary | ICD-10-CM

## 2021-10-06 DIAGNOSIS — E785 Hyperlipidemia, unspecified: Secondary | ICD-10-CM

## 2021-10-06 LAB — HEMOGLOBIN A1C: Hgb A1c MFr Bld: 6.8 % — ABNORMAL HIGH (ref 4.6–6.5)

## 2021-10-06 NOTE — Patient Instructions (Addendum)
[place] ?Schedule an appointment for ultrasound by calling [phone]. ? ?Please stop by lab before you go ?If you have mychart- we will send your results within 3 business days of [place] receiving them.  ?If you do not have mychart- we will call you about results within 5 business days of [place] receiving them.  ?*please also note that you will see labs on mychart as soon as they post. I will later go in and write notes on them- will say "notes from Dr. Yong Channel"  ? ?Recommended follow up: Return in about 4 months (around 02/05/2022) for physical or sooner if needed.Schedule b4 you leave.  ?

## 2021-10-06 NOTE — Progress Notes (Signed)
?Phone [phone] ?In person visit ?  ?Subjective:  ? 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 Back Pain  ?  Pt still c/o back pain and muscle pain under left arm. EKG was performed last visit.   ? ? ?This visit occurred during the SARS-CoV-2 public health emergency.  Safety protocols were in place, including screening questions prior to the visit, additional usage of staff PPE, and extensive cleaning of exam room while observing appropriate contact time as indicated for disinfecting solutions.  ? ?Past Medical History-  ?Patient Active Problem List  ? Diagnosis Date Noted  ? Diabetes mellitus without complication ([place]) 34/74/2595  ?  Priority: High  ? Mild persistent asthma 08/03/2015  ?  Priority: Medium   ? Sinus tachycardia 06/29/2015  ?  Priority: Medium   ? Pernicious anemia 03/29/2015  ?  Priority: Medium   ? Hyperlipidemia associated with type 2 diabetes mellitus ([place]) 11/10/2014  ?  Priority: Medium   ? Essential hypertension 11/10/2014  ?  Priority: Medium   ? Gastroesophageal reflux disease 06/20/2011  ?  Priority: Medium   ? Erectile dysfunction 07/28/2016  ?  Priority: Low  ? Allergic rhinitis 11/16/2015  ?  Priority: Low  ? Migraine 08/20/2011  ?  Priority: Low  ? ? ?Medications- reviewed and updated ?Current Outpatient Medications  ?Medication Sig Dispense Refill  ? albuterol (VENTOLIN HFA) 108 (90 Base) MCG/ACT inhaler Inhale 2 puffs into the lungs every 4 (four) hours as needed for wheezing or shortness of breath. 18 each 5  ? aspirin EC 81 MG tablet Take 1 tablet (81 mg total) by mouth daily. Swallow whole. 90 tablet 3  ? atorvastatin (LIPITOR) 80 MG tablet TAKE 1 TABLET BY MOUTH EVERY DAY 90 tablet 3  ? cyanocobalamin (,VITAMIN B-12,) 1000 MCG/ML injection Inject 1 mL (1,000 mcg total) into the muscle every 30 (thirty) days. 3 mL 3  ? ezetimibe (ZETIA) 10 MG tablet  Take 1 tablet (10 mg total) by mouth daily. 90 tablet 3  ? famotidine (PEPCID) 40 MG tablet Take 1 tablet (40 mg total) by mouth 2 (two) times daily. 60 tablet 11  ? fluticasone (FLOVENT HFA) 44 MCG/ACT inhaler Inhale 2 puffs into the lungs 2 (two) times daily. 1 each 5  ? lisinopril (ZESTRIL) 20 MG tablet Take 1 tablet (20 mg total) by mouth daily. 90 tablet 3  ? metFORMIN (GLUCOPHAGE-XR) 500 MG 24 hr tablet TAKE 2 TABLETS (1,000 MG TOTAL) BY MOUTH IN THE MORNING AND AT BEDTIME. 360 tablet 1  ? metoprolol tartrate (LOPRESSOR) 25 MG tablet Take 1 tablet (25 mg total) by mouth 2 (two) times daily. 180 tablet 3  ? ?No current facility-administered medications for this visit.  ? ?  ?Objective:  ?BP 110/72   Pulse 72   Temp 98.2 ?F (36.8 ?C)   Ht '5\' 7"'$  (1.702 m)   Wt 175 lb 9.6 oz (79.7 kg)   SpO2 97%   BMI 27.50 kg/m?  ?Gen: NAD, resting comfortably ?CV: RRR no murmurs rubs or gallops ?Left sided chest pain closer to axilla- no mass but does have tenderness ?Lungs: CTAB no crackles, wheeze, rhonchi ?Ext: no edema ?Skin: warm, dry ?  ? ?Assessment and Plan  ?#Low back pain ?S: At July 26, 2021 visit patient complained of low back pain and thoracic back pain starting 2 weeks prior to visit that  started after picking up a case of water.  Pain at that time was 2 out of 10 overall.  There was no midline pain-there was some muscle spasm on the left side.  Was not severe enough at that time that he wanted to see sports medicine.  He was going to visit prior chiropractor performance chiropractic.  Offered PT which he declined-did give some home exercises ? ?Today he reports, much better with chiropractor  ?A/P: doing well- continue with chiropractor  ? ?# Diabetes ?S: Medication:Metformin 1000 mg XR twice daily ?-Monthly vitamin B12 injections due to long-term Metformin use and prior lows ?Exercise and diet- has made significant strides with healthy eating/regular exercise and weight loss ?Lab Results  ?Component  Value Date  ? HGBA1C 6.9 (H) 05/16/2021  ? HGBA1C 7.0 (H) 02/04/2021  ? HGBA1C 7.9 (H) 09/23/2020  ?A/P: hopefully stable- update a1c today. Continue current meds for now ? ?#hypertension ?S: medication: Lisinopril '20Mg'$ , Metoprolol '25Mg'$  twice daily ?BP Readings from Last 3 Encounters:  ?10/06/21 110/72  ?08/10/21 (!) 158/84  ?07/26/21 120/70  ? A/P: Controlled. Continue current medications.  ?- HR and BP high at cardiology but was anxious- doing much better ? ?#hyperlipidemia-ideal LDL goal under 70 ?CT cardiac scoring of 0.916 on 09/02/2021 and largely reassuring stress test in february ?S: Medication: Atorvastatin '80Mg'$ , Zetia '10Mg'$  added October 2021, also takes aspirin 81 mg for primary prevention ?Lab Results  ?Component Value Date  ? CHOL 137 08/10/2021  ? HDL 37 (L) 08/10/2021  ? [place] [address] 74 08/10/2021  ? LDLDIRECT 101.0 05/16/2021  ? TRIG 152 (H) 08/10/2021  ? CHOLHDL 3.7 08/10/2021  ?  A/P: Considering such minimal plaque and patient is on max dose statin plus Zetia-we opted to continue current medication ? ?Still getting some pain in left chest- he can actually pinpoint this- I dont feel any abnormalities but we will get ultrasound of this area to be on the safe side ? ?Recommended follow up: Return in about 4 months (around 02/05/2022) for physical or sooner if needed.Schedule b4 you leave. ? ?Lab/Order associations: ?  ICD-10-CM   ?1. Diabetes mellitus without complication (HCC)  B16.9 HgB A1c  ?  ?2. Essential hypertension  I10   ?  ?3. Hyperlipidemia associated with type 2 diabetes mellitus ([place])  E11.69   ? E78.5   ?  ?4. Left-sided chest pain  R07.9 US BREAST LTD UNI LEFT INC AXILLA  ?  ? ? ?No orders of the defined types were placed in this encounter. ? ? ?Return precautions advised.  ?Garret Reddish, MD ? ?

## 2021-10-14 ENCOUNTER — Other Ambulatory Visit: Payer: BC Managed Care – PPO

## 2021-10-20 ENCOUNTER — Other Ambulatory Visit: Payer: Self-pay | Admitting: Family Medicine

## 2021-11-08 ENCOUNTER — Other Ambulatory Visit: Payer: Self-pay | Admitting: Family Medicine

## 2021-12-01 ENCOUNTER — Other Ambulatory Visit: Payer: Self-pay | Admitting: Internal Medicine

## 2021-12-01 ENCOUNTER — Encounter: Payer: Self-pay | Admitting: Family Medicine

## 2021-12-13 ENCOUNTER — Encounter: Payer: Self-pay | Admitting: Family Medicine

## 2022-02-13 ENCOUNTER — Ambulatory Visit (INDEPENDENT_AMBULATORY_CARE_PROVIDER_SITE_OTHER): Payer: BC Managed Care – PPO | Admitting: Family Medicine

## 2022-02-13 ENCOUNTER — Encounter: Payer: Self-pay | Admitting: Family Medicine

## 2022-02-13 VITALS — BP 112/70 | HR 72 | Temp 98.2°F | Ht 67.0 in | Wt 187.8 lb

## 2022-02-13 DIAGNOSIS — Z79899 Other long term (current) drug therapy: Secondary | ICD-10-CM | POA: Diagnosis not present

## 2022-02-13 DIAGNOSIS — I10 Essential (primary) hypertension: Secondary | ICD-10-CM

## 2022-02-13 DIAGNOSIS — E785 Hyperlipidemia, unspecified: Secondary | ICD-10-CM

## 2022-02-13 DIAGNOSIS — Z0000 Encounter for general adult medical examination without abnormal findings: Secondary | ICD-10-CM | POA: Diagnosis not present

## 2022-02-13 DIAGNOSIS — Z125 Encounter for screening for malignant neoplasm of prostate: Secondary | ICD-10-CM | POA: Diagnosis not present

## 2022-02-13 DIAGNOSIS — E119 Type 2 diabetes mellitus without complications: Secondary | ICD-10-CM | POA: Diagnosis not present

## 2022-02-13 DIAGNOSIS — E1169 Type 2 diabetes mellitus with other specified complication: Secondary | ICD-10-CM

## 2022-02-13 LAB — MICROALBUMIN / CREATININE URINE RATIO
Creatinine,U: 222 mg/dL
Microalb Creat Ratio: 8.2 mg/g (ref 0.0–30.0)
Microalb, Ur: 18.1 mg/dL — ABNORMAL HIGH (ref 0.0–1.9)

## 2022-02-13 LAB — CBC WITH DIFFERENTIAL/PLATELET
Basophils Absolute: 0.1 10*3/uL (ref 0.0–0.1)
Basophils Relative: 1.2 % (ref 0.0–3.0)
Eosinophils Absolute: 0.6 10*3/uL (ref 0.0–0.7)
Eosinophils Relative: 10.6 % — ABNORMAL HIGH (ref 0.0–5.0)
HCT: 39.3 % (ref 39.0–52.0)
Hemoglobin: 13.2 g/dL (ref 13.0–17.0)
Lymphocytes Relative: 35.1 % (ref 12.0–46.0)
Lymphs Abs: 2 10*3/uL (ref 0.7–4.0)
MCHC: 33.5 g/dL (ref 30.0–36.0)
MCV: 86.2 fl (ref 78.0–100.0)
Monocytes Absolute: 0.4 10*3/uL (ref 0.1–1.0)
Monocytes Relative: 6.9 % (ref 3.0–12.0)
Neutro Abs: 2.7 10*3/uL (ref 1.4–7.7)
Neutrophils Relative %: 46.2 % (ref 43.0–77.0)
Platelets: 183 10*3/uL (ref 150.0–400.0)
RBC: 4.56 Mil/uL (ref 4.22–5.81)
RDW: 13.8 % (ref 11.5–15.5)
WBC: 5.8 10*3/uL (ref 4.0–10.5)

## 2022-02-13 LAB — VITAMIN B12: Vitamin B-12: 177 pg/mL — ABNORMAL LOW (ref 211–911)

## 2022-02-13 LAB — HEMOGLOBIN A1C: Hgb A1c MFr Bld: 7.4 % — ABNORMAL HIGH (ref 4.6–6.5)

## 2022-02-13 LAB — PSA: PSA: 0.39 ng/mL (ref 0.10–4.00)

## 2022-02-13 MED ORDER — ALBUTEROL SULFATE HFA 108 (90 BASE) MCG/ACT IN AERS: 2.0000 | INHALATION_SPRAY | RESPIRATORY_TRACT | 5 refills | Status: DC | PRN

## 2022-02-13 MED ORDER — PULMICORT FLEXHALER 90 MCG/ACT IN AEPB: 1.0000 | INHALATION_SPRAY | Freq: Two times a day (BID) | RESPIRATORY_TRACT | 3 refills | Status: DC

## 2022-02-13 NOTE — Patient Instructions (Addendum)
Flu shot- we should have these available within a month or two but please let us know if you get at outside pharmacy -consider covid shot in october  Please stop by lab before you go If you have mychart- we will send your results within 3 business days of [place] receiving them.  If you do not have mychart- we will call you about results within 5 business days of [place] receiving them.  *please also note that you will see labs on mychart as soon as they post. I will later go in and write notes on them- will say "notes from Dr. Yong Channel"   Recommended follow up: Return in about 4 months (around 06/15/2022) for followup or sooner if needed.Schedule b4 you leave.

## 2022-02-13 NOTE — Progress Notes (Signed)
Phone: [phone]   Subjective:  Patient presents today for their annual physical. Chief complaint-noted.   See problem oriented charting- ROS- full  review of systems was completed and negative  Per full ROS sheet completed by patient  The following were reviewed and entered/updated in epic: Past Medical History:  Diagnosis Date   Allergy    Asthma    B12 deficiency    Diabetes mellitus without complication ([place])    diet controlled   Gastritis    GERD (gastroesophageal reflux disease)    Hiatal hernia    Hyperlipidemia    Hypertension    Intestinal metaplasia of gastric cardia    Intestinal metaplasia of gastric mucosa    Sinus tachycardia 06/29/2015   Tubular adenoma of colon    Patient Active Problem List   Diagnosis Date Noted   Diabetes mellitus without complication ([place]) 02/72/5366    Priority: High   Mild persistent asthma 08/03/2015    Priority: Medium    Sinus tachycardia 06/29/2015    Priority: Medium    Pernicious anemia 03/29/2015    Priority: Medium    Hyperlipidemia associated with type 2 diabetes mellitus ([place]) 11/10/2014    Priority: Medium    Essential hypertension 11/10/2014    Priority: Medium    Gastroesophageal reflux disease 06/20/2011    Priority: Medium    Erectile dysfunction 07/28/2016    Priority: Low   Allergic rhinitis 11/16/2015    Priority: Low   Migraine 08/20/2011    Priority: Low   Past Surgical History:  Procedure Laterality Date   COLONOSCOPY     LEG SURGERY     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   UPPER GASTROINTESTINAL ENDOSCOPY      Family History  Problem Relation Age of Onset   Diabetes Mother    Kidney disease Mother    Diabetes Father    Prostate cancer Father    Hyperlipidemia Father        mother   Hypertension Father        mother   Colon cancer Neg Hx    Colon polyps Neg Hx    Esophageal cancer Neg Hx    Rectal cancer Neg Hx    Stomach cancer Neg Hx    Pancreatic cancer Neg Hx    Allergic rhinitis Neg Hx     Angioedema Neg Hx    Asthma Neg Hx    Eczema Neg Hx    Immunodeficiency Neg Hx    Urticaria Neg Hx     Medications- reviewed and updated Current Outpatient Medications  Medication Sig Dispense Refill   aspirin EC 81 MG tablet Take 1 tablet (81 mg total) by mouth daily. Swallow whole. 90 tablet 3   atorvastatin (LIPITOR) 80 MG tablet TAKE 1 TABLET BY MOUTH EVERY DAY 90 tablet 3   Budesonide (PULMICORT FLEXHALER) 90 MCG/ACT inhaler Inhale 1 puff into the lungs 2 (two) times daily. 3 each 3   cyanocobalamin (,VITAMIN B-12,) 1000 MCG/ML injection Inject 1 mL (1,000 mcg total) into the muscle every 30 (thirty) days. 3 mL 3   ezetimibe (ZETIA) 10 MG tablet Take 1 tablet (10 mg total) by mouth daily. 90 tablet 3   famotidine (PEPCID) 40 MG tablet Take 1 tablet (40 mg total) by mouth 2 (two) times daily. 60 tablet 11   lisinopril (ZESTRIL) 20 MG tablet Take 1 tablet (20 mg total) by mouth daily. 90 tablet 3   metFORMIN (GLUCOPHAGE-XR) 500 MG 24 hr tablet TAKE 2 TABLETS (1,000 MG  TOTAL) BY MOUTH IN THE MORNING AND AT BEDTIME. 360 tablet 1   metoprolol tartrate (LOPRESSOR) 25 MG tablet Take 1 tablet (25 mg total) by mouth 2 (two) times daily. 180 tablet 3   albuterol (VENTOLIN HFA) 108 (90 Base) MCG/ACT inhaler Inhale 2 puffs into the lungs every 4 (four) hours as needed for wheezing or shortness of breath. 18 each 5   No current facility-administered medications for this visit.    Allergies-reviewed and updated No Known Allergies  Social History   Social History Narrative   Married 26 years in 2023. 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 Oropharynx normal Neck: no thyromegaly CV: RRR no murmurs rubs or gallops Lungs: CTAB no crackles, wheeze, rhonchi Abdomen: soft/nontender/nondistended/normal bowel sounds. No rebound or guarding.  Ext: no edema Skin: warm, dry Neuro: grossly normal, moves all extremities, PERRLA    Assessment and Plan  51 y.o. male presenting for annual physical.  Health Maintenance counseling: 1. Anticipatory guidance: Patient counseled regarding regular dental exams -q6 months, eye exams -yearly,  avoiding smoking and second hand smoke , limiting alcohol to 2 beverages per day , no illicit drugs .   2. Risk factor reduction:  Advised patient of need for regular exercise and diet rich and fruits and vegetables to reduce risk of heart attack and stroke.  Exercise- walking 4 days a week.  Diet/weight management-up 12 lbs from last visit- some summer vacations- plans to tighten up diet.  Wt Readings from Last 3 Encounters:  02/13/22 187 lb 12.8 oz (85.2 kg)  10/06/21 175 lb 9.6 oz (79.7 kg)  08/15/21 172 lb (78 kg)  3. Immunizations/screenings/ancillary studies- consider flu and covid shot in the fall Immunization History  Administered Date(s) Administered   Influenza,inj,Quad PF,6+ Mos 03/31/2019, 05/06/2020   PFIZER Comirnaty(Gray Top)Covid-19 Tri-Sucrose Vaccine 02/23/2021   PFIZER(Purple Top)SARS-COV-2 Vaccination 08/05/2019, 08/31/2019  4. Prostate cancer screening- low risk prior psa trend- update with labs . Father with history of prostate cancer. Declines rectal Lab Results  Component Value Date   PSA 0.44 02/04/2021   PSA 0.39 01/05/2020   PSA 0.42 05/30/2019   5. Colon cancer screening - 05/22/17- will be due this fall- should get letter from [place] 6. Skin cancer screening- lower risk due to melanin content. advised regular sunscreen use. Denies worrisome, changing, or new skin lesions.  7. Smoking  associated screening (lung cancer screening, AAA screen 65-75, UA)- never smoker 8. STD screening - only active with wife  Status of chronic or acute concerns   # Diabetes S: Medication:Metformin 1000 mg twice daily XR -Monthly vitamin B12 injections due to long-term Metformin use and prior lows- continues this   A/P: hopefully stable- update a1c today. Continue current meds for now    #hypertension S: medication: Lisinopril '20Mg'$ , Metoprolol '25Mg'$  twice daily Home readings #s: not checking A/P: Controlled. Continue current medications.   #hyperlipidemia-LDL goal under 70 S: Medication: Atorvastatin '80Mg'$ ,  Zetia '10Mg'$  added October 2021, also takes aspirin 81 mg for primary prevention Lab Results  Component Value Date   CHOL 137 08/10/2021   HDL 37 (L) 08/10/2021   LDLCALC 74 08/10/2021   LDLDIRECT 101.0 05/16/2021   TRIG 152 (H) 08/10/2021   CHOLHDL 3.7 08/10/2021  A/P: reasonable control- update lipids with labs today  # Asthma S: Maintenance Medication: Flovent 44 mcg 2 puffs twice daily in the past- now needs Pulmicort for insurance As needed medication: albuterol. Patient is using this 2-3 per week before exercise A/P: doing well- continue current med   #Pernicious anemia S: Discovered by Dr. Laney Pastor.  Receives injections of B12  A/P: continues monthly- update b12 with labs   # GERD S:Medication: reasonable control on Famotidine 40 mg twice daily through Dr. Hilarie Fredrickson A/P: Controlled. Continue current medications.    Recommended follow up: Return in about 4 months (around 06/15/2022) for followup or sooner if needed.Schedule b4 you leave.  Lab/Order associations: fasting   ICD-10-CM   1. Preventative health care  Z00.00 CBC with Differential/Platelet    Comprehensive metabolic panel    Lipid panel    Hemoglobin A1c    PSA    Vitamin B12    Microalbumin / creatinine urine ratio    2. Diabetes mellitus without complication (HCC)  N82.9 CBC with Differential/Platelet     Comprehensive metabolic panel    Lipid panel    Hemoglobin A1c    Microalbumin / creatinine urine ratio    3. Screening for prostate cancer  Z12.5 PSA    4. High risk medication use  Z79.899 Vitamin B12    5. Hyperlipidemia associated with type 2 diabetes mellitus (HCC)  E11.69    E78.5     6. Essential hypertension  I10       Meds ordered this encounter  Medications   albuterol (VENTOLIN HFA) 108 (90 Base) MCG/ACT inhaler    Sig: Inhale 2 puffs into the lungs every 4 (four) hours as needed for wheezing or shortness of breath.    Dispense:  18 each    Refill:  5   Budesonide (PULMICORT FLEXHALER) 90 MCG/ACT inhaler    Sig: Inhale 1 puff into the lungs 2 (two) times daily.    Dispense:  3 each    Refill:  3    Return precautions advised.  Garret Reddish, MD

## 2022-02-14 ENCOUNTER — Encounter: Payer: Self-pay | Admitting: Family Medicine

## 2022-02-14 LAB — LIPID PANEL
Cholesterol: 159 mg/dL (ref 0–200)
HDL: 37.3 mg/dL — ABNORMAL LOW (ref >39.00)
NonHDL: 121.24
Total CHOL/HDL Ratio: 4
Triglycerides: 233 mg/dL — ABNORMAL HIGH (ref 0.0–149.0)
VLDL: 46.6 mg/dL — ABNORMAL HIGH (ref 0.0–40.0)

## 2022-02-14 LAB — COMPREHENSIVE METABOLIC PANEL
ALT: 28 U/L (ref 0–53)
AST: 18 U/L (ref 0–37)
Albumin: 4.5 g/dL (ref 3.5–5.2)
Alkaline Phosphatase: 56 U/L (ref 39–117)
BUN: 16 mg/dL (ref 6–23)
CO2: 28 mEq/L (ref 19–32)
Calcium: 9.2 mg/dL (ref 8.4–10.5)
Chloride: 102 mEq/L (ref 96–112)
Creatinine, Ser: 0.98 mg/dL (ref 0.40–1.50)
GFR: 90.45 mL/min (ref >60.00)
Glucose, Bld: 147 mg/dL — ABNORMAL HIGH (ref 70–99)
Potassium: 4.9 mEq/L (ref 3.5–5.1)
Sodium: 141 mEq/L (ref 135–145)
Total Bilirubin: 0.5 mg/dL (ref 0.2–1.2)
Total Protein: 7.1 g/dL (ref 6.0–8.3)

## 2022-02-14 LAB — LDL CHOLESTEROL, DIRECT: Direct LDL: 87 mg/dL

## 2022-02-15 ENCOUNTER — Other Ambulatory Visit: Payer: Self-pay

## 2022-02-15 ENCOUNTER — Other Ambulatory Visit: Payer: Self-pay | Admitting: Family Medicine

## 2022-02-15 MED ORDER — EMPAGLIFLOZIN 10 MG PO TABS: 10.0000 mg | ORAL_TABLET | Freq: Every day | ORAL | 3 refills | Status: DC

## 2022-02-27 ENCOUNTER — Encounter: Payer: Self-pay | Admitting: Family Medicine

## 2022-02-28 ENCOUNTER — Other Ambulatory Visit: Payer: Self-pay

## 2022-02-28 MED ORDER — PULMICORT FLEXHALER 90 MCG/ACT IN AEPB: 1.0000 | INHALATION_SPRAY | Freq: Two times a day (BID) | RESPIRATORY_TRACT | 3 refills | Status: DC

## 2022-03-14 ENCOUNTER — Encounter: Payer: Self-pay | Admitting: Internal Medicine

## 2022-03-29 ENCOUNTER — Ambulatory Visit (AMBULATORY_SURGERY_CENTER): Payer: BC Managed Care – PPO | Admitting: *Deleted

## 2022-03-29 VITALS — Ht 67.0 in | Wt 181.0 lb

## 2022-03-29 DIAGNOSIS — K317 Polyp of stomach and duodenum: Secondary | ICD-10-CM

## 2022-03-29 DIAGNOSIS — Z86010 Personal history of colonic polyps: Secondary | ICD-10-CM

## 2022-03-29 MED ORDER — NA SULFATE-K SULFATE-MG SULF 17.5-3.13-1.6 GM/177ML PO SOLN: 1.0000 | Freq: Once | ORAL | 0 refills | Status: AC

## 2022-03-29 NOTE — Progress Notes (Signed)
No egg or soy allergy known to patient  No issues known to pt with past sedation with any surgeries or procedures Patient denies ever being told they had issues or difficulty with intubation  No FH of Malignant Hyperthermia Pt is not on diet pills Pt is not on  home 02  Pt is not on blood thinners  Pt denies issues with constipation  No A fib or A flutter Have any cardiac testing pending--no Pt instructed to use [email or web address] or GoodRx for a price reduction on prep   

## 2022-04-03 ENCOUNTER — Encounter: Payer: Self-pay | Admitting: *Deleted

## 2022-04-04 ENCOUNTER — Encounter: Payer: Self-pay | Admitting: Internal Medicine

## 2022-04-13 ENCOUNTER — Other Ambulatory Visit: Payer: Self-pay | Admitting: Family Medicine

## 2022-04-13 ENCOUNTER — Other Ambulatory Visit: Payer: Self-pay | Admitting: Internal Medicine

## 2022-04-13 DIAGNOSIS — D510 Vitamin B12 deficiency anemia due to intrinsic factor deficiency: Secondary | ICD-10-CM

## 2022-04-14 ENCOUNTER — Other Ambulatory Visit: Payer: Self-pay | Admitting: Family Medicine

## 2022-04-17 ENCOUNTER — Encounter: Payer: BC Managed Care – PPO | Admitting: Internal Medicine

## 2022-04-18 MED ORDER — PULMICORT FLEXHALER 90 MCG/ACT IN AEPB: 1.0000 | INHALATION_SPRAY | Freq: Two times a day (BID) | RESPIRATORY_TRACT | 3 refills | Status: DC

## 2022-04-18 NOTE — Telephone Encounter (Signed)
Does pharmacy happen to know covered alternatives?

## 2022-04-18 NOTE — Telephone Encounter (Signed)
Spoke with pharmacist and they are needing the ok to resubmit it to insurance.

## 2022-04-18 NOTE — Telephone Encounter (Signed)
I'm confused because the pulmicort flex haler was sent in - in august?

## 2022-04-18 NOTE — Telephone Encounter (Signed)
See below

## 2022-04-18 NOTE — Telephone Encounter (Signed)
Called and spoke with pharmacy and insurance covers the [place] highlighted in yellow, ok to submit?

## 2022-05-16 ENCOUNTER — Ambulatory Visit (AMBULATORY_SURGERY_CENTER): Payer: BC Managed Care – PPO

## 2022-05-16 VITALS — Ht 67.0 in | Wt 175.0 lb

## 2022-05-16 DIAGNOSIS — Z86010 Personal history of colonic polyps: Secondary | ICD-10-CM

## 2022-05-16 NOTE — Progress Notes (Signed)

## 2022-05-30 ENCOUNTER — Encounter: Payer: Self-pay | Admitting: Internal Medicine

## 2022-06-06 ENCOUNTER — Ambulatory Visit (AMBULATORY_SURGERY_CENTER): Payer: BC Managed Care – PPO | Admitting: Internal Medicine

## 2022-06-06 ENCOUNTER — Encounter: Payer: Self-pay | Admitting: Internal Medicine

## 2022-06-06 VITALS — BP 115/75 | HR 83 | Temp 98.0°F | Resp 21 | Ht 67.0 in | Wt 175.8 lb

## 2022-06-06 DIAGNOSIS — K635 Polyp of colon: Secondary | ICD-10-CM

## 2022-06-06 DIAGNOSIS — D128 Benign neoplasm of rectum: Secondary | ICD-10-CM

## 2022-06-06 DIAGNOSIS — Z86010 Personal history of colonic polyps: Secondary | ICD-10-CM | POA: Diagnosis not present

## 2022-06-06 DIAGNOSIS — K2940 Chronic atrophic gastritis without bleeding: Secondary | ICD-10-CM | POA: Diagnosis not present

## 2022-06-06 DIAGNOSIS — Z09 Encounter for follow-up examination after completed treatment for conditions other than malignant neoplasm: Secondary | ICD-10-CM

## 2022-06-06 DIAGNOSIS — Z8719 Personal history of other diseases of the digestive system: Secondary | ICD-10-CM

## 2022-06-06 DIAGNOSIS — D124 Benign neoplasm of descending colon: Secondary | ICD-10-CM

## 2022-06-06 DIAGNOSIS — K319 Disease of stomach and duodenum, unspecified: Secondary | ICD-10-CM | POA: Diagnosis not present

## 2022-06-06 DIAGNOSIS — D123 Benign neoplasm of transverse colon: Secondary | ICD-10-CM

## 2022-06-06 MED ORDER — SODIUM CHLORIDE 0.9 % IV SOLN: 500.0000 mL | Freq: Once | INTRAVENOUS | Status: DC

## 2022-06-06 NOTE — Progress Notes (Unsigned)
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  The nature of the procedure, as well as the risks, benefits, and alternatives were carefully and thoroughly reviewed with the patient. Ample time for discussion and questions allowed. The patient understood, was satisfied, and agreed to proceed.     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  Medical history as below.  Tolerated the prep.  No recent chest pain or shortness of breath.  No abdominal pain today.  Past Medical History:  Diagnosis Date   Allergy    seasonal   Asthma    B12 deficiency    Diabetes mellitus without complication (HCC)    diet controlled   Gastritis    GERD (gastroesophageal reflux disease)    Hiatal hernia    Hyperlipidemia    Hypertension    Intestinal metaplasia of gastric cardia    Intestinal metaplasia of gastric mucosa    Sinus tachycardia 06/29/2015   Tubular adenoma of colon     Past Surgical History:  Procedure Laterality Date   COLONOSCOPY     LEG SURGERY     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   UPPER GASTROINTESTINAL ENDOSCOPY      Prior to Admission medications   Medication Sig Start Date End Date Taking? Authorizing Provider  aspirin EC 81 MG tablet Take 1 tablet (81 mg total) by mouth daily. Swallow whole. 08/10/21  Yes Swinyer, Lanice Schwab, NP  atorvastatin (LIPITOR) 80 MG tablet TAKE 1 TABLET BY MOUTH EVERY DAY 11/08/21  Yes Marin Olp, MD  empagliflozin (JARDIANCE) 10 MG TABS tablet Take 1 tablet (10 mg total) by mouth daily before breakfast. 02/15/22  Yes Marin Olp, MD  ezetimibe (ZETIA) 10 MG tablet TAKE 1 TABLET BY MOUTH EVERY DAY 02/15/22  Yes Marin Olp, MD   famotidine (PEPCID) 40 MG tablet TAKE 1 TABLET BY MOUTH TWICE A DAY 04/14/22  Yes Malaisha Silliman, Lajuan Lines, MD  lisinopril (ZESTRIL) 20 MG tablet TAKE 1 TABLET BY MOUTH EVERY DAY 02/15/22  Yes Marin Olp, MD  metFORMIN (GLUCOPHAGE-XR) 500 MG 24 hr tablet TAKE 2 TABLETS (1,000 MG TOTAL) BY MOUTH IN THE MORNING AND AT BEDTIME. 04/14/22  Yes Marin Olp, MD  metoprolol tartrate (LOPRESSOR) 25 MG tablet Take 1 tablet (25 mg total) by mouth 2 (two) times daily. 08/10/21  Yes Swinyer, Lanice Schwab, NP  albuterol (VENTOLIN HFA) 108 (90 Base) MCG/ACT inhaler Inhale 2 puffs into the lungs every 4 (four) hours as needed for wheezing or shortness of breath. 02/13/22   Marin Olp, MD  Budesonide (PULMICORT FLEXHALER) 90 MCG/ACT inhaler Inhale 1 puff into the lungs 2 (two) times daily. 04/18/22   Marin Olp, MD  cyanocobalamin (VITAMIN B12) 1000 MCG/ML injection INJECT 1 ML (1,000 MCG TOTAL) INTO THE MUSCLE EVERY 30 DAYS. 04/14/22   Marin Olp, MD    Current Outpatient Medications  Medication Sig Dispense Refill   aspirin EC 81 MG tablet Take 1 tablet (81 mg total) by mouth daily. Swallow whole. 90 tablet 3   atorvastatin (LIPITOR) 80 MG tablet TAKE 1 TABLET BY MOUTH EVERY DAY 90 tablet 3   empagliflozin (JARDIANCE) 10 MG TABS  tablet Take 1 tablet (10 mg total) by mouth daily before breakfast. 90 tablet 3   ezetimibe (ZETIA) 10 MG tablet TAKE 1 TABLET BY MOUTH EVERY DAY 90 tablet 3   famotidine (PEPCID) 40 MG tablet TAKE 1 TABLET BY MOUTH TWICE A DAY 180 tablet 0   lisinopril (ZESTRIL) 20 MG tablet TAKE 1 TABLET BY MOUTH EVERY DAY 90 tablet 3   metFORMIN (GLUCOPHAGE-XR) 500 MG 24 hr tablet TAKE 2 TABLETS (1,000 MG TOTAL) BY MOUTH IN THE MORNING AND AT BEDTIME. 360 tablet 1   metoprolol tartrate (LOPRESSOR) 25 MG tablet Take 1 tablet (25 mg total) by mouth 2 (two) times daily. 180 tablet 3   albuterol (VENTOLIN HFA) 108 (90 Base) MCG/ACT inhaler Inhale 2 puffs into the lungs every 4 (four) hours  as needed for wheezing or shortness of breath. 18 each 5   Budesonide (PULMICORT FLEXHALER) 90 MCG/ACT inhaler Inhale 1 puff into the lungs 2 (two) times daily. 3 each 3   cyanocobalamin (VITAMIN B12) 1000 MCG/ML injection INJECT 1 ML (1,000 MCG TOTAL) INTO THE MUSCLE EVERY 30 DAYS. 3 mL 3   Current Facility-Administered Medications  Medication Dose Route Frequency Provider Last Rate Last Admin   0.9 %  sodium chloride infusion  500 mL Intravenous Once Reagen Haberman, Lajuan Lines, MD        Allergies as of 06/06/2022   (No Known Allergies)    Family History  Problem Relation Age of Onset   Diabetes Mother    Kidney disease Mother    Diabetes Father    Prostate cancer Father    Hyperlipidemia Father        mother   Hypertension Father        mother   Colon cancer Neg Hx    Colon polyps Neg Hx    Esophageal cancer Neg Hx    Rectal cancer Neg Hx    Stomach cancer Neg Hx    Pancreatic cancer Neg Hx    Allergic rhinitis Neg Hx    Angioedema Neg Hx    Asthma Neg Hx    Eczema Neg Hx    Immunodeficiency Neg Hx    Urticaria Neg Hx    Crohn's disease Neg Hx    Ulcerative colitis Neg Hx     Social History   Socioeconomic History   Marital status: Married    Spouse name: Not on file   Number of children: 2   Years of education: Not on file   Highest education level: Not on file  Occupational History   Occupation: professor    Employer: [employer]  Tobacco Use   Smoking status: Never    Passive exposure: Never   Smokeless tobacco: Never  Vaping Use   Vaping Use: Never used  Substance and Sexual Activity   Alcohol use: Yes    Alcohol/week: 3.0 - 4.0 standard drinks of alcohol    Types: 3 - 4 Cans of beer per week    Comment: social   Drug use: No   Sexual activity: Yes    Birth control/protection: None  Other Topics Concern   Not on file  Social History Narrative   Married 26 years in 2023. 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    [employer]en [employer]ling [employer], graduate [employer]-       Hobbies: family time,[place] golfing, basketball with son, walking at  park   Social Determinants of Radio broadcast assistant Strain: Not on file  Food Insecurity: Not on file  Transportation Needs: Not on file  Physical Activity: Not on file  Stress: Not on file  Social Connections: Not on file  Intimate Partner Violence: Not on file    Physical Exam: Vital signs in last 24 hours: '@BP'$  (!) 149/86   Pulse 89   Temp 98 F (36.7 C)   Resp 13   Ht '5\' 7"'$  (1.702 m)   Wt 175 lb 12.8 oz (79.7 kg)   SpO2 100%   BMI 27.53 kg/m  GEN: NAD EYE: Sclerae anicteric ENT: MMM CV: Non-tachycardic Pulm: CTA b/l GI: Soft, NT/ND NEURO:  Alert & Oriented x 3   Zenovia Jarred, MD [facility]  06/06/2022 8:07 AM

## 2022-06-06 NOTE — Patient Instructions (Signed)
Discharge instructions given. Handout on polyps. Biopsies taken. Resume previous medications. YOU HAD AN ENDOSCOPIC PROCEDURE TODAY AT [facility]:   Refer to the procedure report that was given to you for any specific questions about what was found during the examination.  If the procedure report does not answer your questions, please call your gastroenterologist to clarify.  If you requested that your care partner not be given the details of your procedure findings, then the procedure report has been included in a sealed envelope for you to review at your convenience later.  YOU SHOULD EXPECT: Some feelings of bloating in the abdomen. Passage of more gas than usual.  Walking can help get rid of the air that was put into your GI tract during the procedure and reduce the bloating. If you had a lower endoscopy (such as a colonoscopy or flexible sigmoidoscopy) you may notice spotting of blood in your stool or on the toilet paper. If you underwent a bowel prep for your procedure, you may not have a normal bowel movement for a few days.  Please Note:  You might notice some irritation and congestion in your nose or some drainage.  This is from the oxygen used during your procedure.  There is no need for concern and it should clear up in a day or so.  SYMPTOMS TO REPORT IMMEDIATELY:  Following lower endoscopy (colonoscopy or flexible sigmoidoscopy):  Excessive amounts of blood in the stool  Significant tenderness or worsening of abdominal pains  Swelling of the abdomen that is new, acute  Fever of 100F or higher  Following upper endoscopy (EGD)  Vomiting of blood or coffee ground material  New chest pain or pain under the shoulder blades  Painful or persistently difficult swallowing  New shortness of breath  Fever of 100F or higher  Black, tarry-looking stools  For urgent or emergent issues, a gastroenterologist can be reached at any hour by calling [phone]. Do not use  MyChart messaging for urgent concerns.    DIET:  We do recommend a small meal at first, but then you may proceed to your regular diet.  Drink plenty of fluids but you should avoid alcoholic beverages for 24 hours.  ACTIVITY:  You should plan to take it easy for the rest of today and you should NOT DRIVE or use heavy machinery until tomorrow (because of the sedation medicines used during the test).    FOLLOW UP: Our staff will call the number listed on your records the next business day following your procedure.  We will call around 7:15- 8:00 am to check on you and address any questions or concerns that you may have regarding the information given to you following your procedure. If we do not reach you, we will leave a message.     If any biopsies were taken you will be contacted by phone or by letter within the next 1-3 weeks.  Please call us at [phone] if you have not heard about the biopsies in 3 weeks.    SIGNATURES/CONFIDENTIALITY: You and/or your care partner have signed paperwork which will be entered into your electronic medical record.  These signatures attest to the fact that that the information above on your After Visit Summary has been reviewed and is understood.  Full responsibility of the confidentiality of this discharge information lies with you and/or your care-partner.

## 2022-06-06 NOTE — Progress Notes (Unsigned)
Pt's states no medical or surgical changes since previsit or office visit. 

## 2022-06-06 NOTE — Progress Notes (Unsigned)
To pacu, VSS. Report to [email or web address] 

## 2022-06-06 NOTE — Progress Notes (Signed)
Called to room to assist during endoscopic procedure.  Patient ID and intended procedure confirmed with present staff. Received instructions for my participation in the procedure from the performing physician.  

## 2022-06-06 NOTE — Op Note (Signed)
[facility] Patient Name: Gregory Collins Procedure Date: 06/06/2022 8:00 AM MRN: [id number] Endoscopist: Jerene Bears , MD, [id number] Age: 51 Referring MD:  Date of Birth: 1971/05/21 Gender: Male Account #: [id number] Procedure:                Colonoscopy Indications:              High risk colon cancer surveillance: Personal                            history of non-advanced adenomas, Last colonoscopy:                            November 2018 (2 adenomas removed) Medicines:                Monitored Anesthesia Care Procedure:                Pre-Anesthesia Assessment:                           - Prior to the procedure, a History and Physical                            was performed, and patient medications and                            allergies were reviewed. The patient's tolerance of                            previous anesthesia was also reviewed. The risks                            and benefits of the procedure and the sedation                            options and risks were discussed with the patient.                            All questions were answered, and informed consent                            was obtained. Prior Anticoagulants: The patient has                            taken no anticoagulant or antiplatelet agents. ASA                            Grade Assessment: II - A patient with mild systemic                            disease. After reviewing the risks and benefits,                            the patient was deemed in satisfactory condition to  undergo the procedure.                           After obtaining informed consent, the colonoscope                            was passed under direct vision. Throughout the                            procedure, the patient's blood pressure, pulse, and                            oxygen saturations were monitored continuously. The                            Olympus CF-HQ190L (Serial#  [id number]) Colonoscope was                            introduced through the anus and advanced to the                            cecum, identified by appendiceal orifice and                            ileocecal valve. The colonoscopy was performed                            without difficulty. The patient tolerated the                            procedure well. The quality of the bowel                            preparation was good. The ileocecal valve,                            appendiceal orifice, and rectum were photographed. Scope In: 8:22:13 AM Scope Out: 8:36:24 AM Scope Withdrawal Time: 0 hours 13 minutes 31 seconds  Total Procedure Duration: 0 hours 14 minutes 11 seconds  Findings:                 The digital rectal exam was normal.                           A 3 mm polyp was found in the proximal transverse                            colon. The polyp was sessile. The polyp was removed                            with a cold snare. Resection was complete, but the                            polyp tissue was not retrieved.  An 8 mm polyp was found in the descending colon.                            The polyp was sessile. The polyp was removed with a                            cold snare. Resection and retrieval were complete.                           A 5 mm polyp was found in the proximal rectum. The                            polyp was sessile. The polyp was removed with a                            cold snare. Resection and retrieval were complete.                           The retroflexed view of the distal rectum and anal                            verge was normal and showed no anal or rectal                            abnormalities. Complications:            No immediate complications. Estimated Blood Loss:     Estimated blood loss was minimal. Impression:               - One 3 mm polyp in the proximal transverse colon,                            removed  with a cold snare. Complete resection.                            Polyp tissue not retrieved.                           - One 8 mm polyp in the descending colon, removed                            with a cold snare. Resected and retrieved.                           - One 5 mm polyp in the proximal rectum, removed                            with a cold snare. Resected and retrieved.                           - The distal rectum and anal verge are normal on  retroflexion view. Recommendation:           - Patient has a contact number available for                            emergencies. The signs and symptoms of potential                            delayed complications were discussed with the                            patient. Return to normal activities tomorrow.                            Written discharge instructions were provided to the                            patient.                           - Resume previous diet.                           - Continue present medications.                           - Await pathology results.                           - Repeat colonoscopy is recommended for                            surveillance. The colonoscopy date will be                            determined after pathology results from today's                            exam become available for review. Jerene Bears, MD 06/06/2022 8:45:19 AM This report has been signed electronically.

## 2022-06-06 NOTE — Op Note (Signed)
[facility] Patient Name: Gregory Collins Procedure Date: 06/06/2022 8:01 AM MRN: [id number] Endoscopist: Jerene Bears , MD, [id number] Age: 51 Referring MD:  Date of Birth: [birth year] Gender: Male Account #: [id number] Procedure:                Upper GI endoscopy Indications:              Follow-up of atrophic gastritis with hx of                            intestinal metaplasia without dysplasia, hx of                            hyperplastic gastric polyps (2016, 2020); last EGD                            July 2020 Medicines:                Monitored Anesthesia Care Procedure:                Pre-Anesthesia Assessment:                           - Prior to the procedure, a History and Physical                            was performed, and patient medications and                            allergies were reviewed. The patient's tolerance of                            previous anesthesia was also reviewed. The risks                            and benefits of the procedure and the sedation                            options and risks were discussed with the patient.                            All questions were answered, and informed consent                            was obtained. Prior Anticoagulants: The patient has                            taken no anticoagulant or antiplatelet agents. ASA                            Grade Assessment: II - A patient with mild systemic                            disease. After reviewing the risks and benefits,  the patient was deemed in satisfactory condition to                            undergo the procedure.                           After obtaining informed consent, the endoscope was                            passed under direct vision. Throughout the                            procedure, the patient's blood pressure, pulse, and                            oxygen saturations were monitored continuously. The                             GIF [id number] #[id number] was introduced through the                            mouth, and advanced to the second part of duodenum.                            The upper GI endoscopy was accomplished without                            difficulty. The patient tolerated the procedure                            well. Scope In: Scope Out: Findings:                 The examined esophagus was normal.                           The cardia and gastric fundus were normal on                            retroflexion.                           Diffuse atrophic mucosa was found in the cardia, in                            the gastric fundus and in the gastric body.                            Biopsies were taken with a cold forceps for                            histology. Multiple biopsies were obtained in the                            cardia, in the gastric fundus, in the gastric body,  at the incisura and in the gastric antrum with cold                            forceps for histology. No gastric polyps seen.                           The examined duodenum was normal. Complications:            No immediate complications. Estimated Blood Loss:     Estimated blood loss was minimal. Impression:               - Normal esophagus.                           - Gastric mucosal atrophy (cardia/fundus/gastric                            body). Biopsied.                           - No gastric polyps.                           - Normal examined duodenum.                           - Multiple biopsies were obtained in the cardia, in                            the gastric fundus, in the gastric body, at the                            incisura and in the gastric antrum. Recommendation:           - Patient has a contact number available for                            emergencies. The signs and symptoms of potential                            delayed complications were  discussed with the                            patient. Return to normal activities tomorrow.                            Written discharge instructions were provided to the                            patient.                           - Resume previous diet.                           - Continue present medications.                           -  Await pathology results.                           - Repeat upper endoscopy for surveillance based on                            pathology results. Jerene Bears, MD 06/06/2022 8:43:03 AM This report has been signed electronically.

## 2022-06-07 ENCOUNTER — Telehealth: Payer: Self-pay

## 2022-06-07 NOTE — Telephone Encounter (Signed)
No answer, left message to call if having any issues or concerns, B.Taleia Sadowski RN 

## 2022-06-09 ENCOUNTER — Encounter: Payer: Self-pay | Admitting: Internal Medicine

## 2022-06-15 ENCOUNTER — Encounter: Payer: Self-pay | Admitting: Family Medicine

## 2022-06-15 ENCOUNTER — Other Ambulatory Visit: Payer: Self-pay | Admitting: Internal Medicine

## 2022-06-15 ENCOUNTER — Ambulatory Visit: Payer: BC Managed Care – PPO | Admitting: Family Medicine

## 2022-06-15 VITALS — BP 122/76 | HR 71 | Temp 98.0°F | Ht 67.0 in | Wt 179.2 lb

## 2022-06-15 DIAGNOSIS — E1169 Type 2 diabetes mellitus with other specified complication: Secondary | ICD-10-CM | POA: Diagnosis not present

## 2022-06-15 DIAGNOSIS — I10 Essential (primary) hypertension: Secondary | ICD-10-CM | POA: Diagnosis not present

## 2022-06-15 DIAGNOSIS — E785 Hyperlipidemia, unspecified: Secondary | ICD-10-CM | POA: Diagnosis not present

## 2022-06-15 DIAGNOSIS — D510 Vitamin B12 deficiency anemia due to intrinsic factor deficiency: Secondary | ICD-10-CM

## 2022-06-15 DIAGNOSIS — E119 Type 2 diabetes mellitus without complications: Secondary | ICD-10-CM

## 2022-06-15 LAB — COMPREHENSIVE METABOLIC PANEL
ALT: 21 U/L (ref 0–53)
AST: 15 U/L (ref 0–37)
Albumin: 4.7 g/dL (ref 3.5–5.2)
Alkaline Phosphatase: 64 U/L (ref 39–117)
BUN: 12 mg/dL (ref 6–23)
CO2: 32 mEq/L (ref 19–32)
Calcium: 9.2 mg/dL (ref 8.4–10.5)
Chloride: 99 mEq/L (ref 96–112)
Creatinine, Ser: 0.92 mg/dL (ref 0.40–1.50)
GFR: 97.35 mL/min (ref >60.00)
Glucose, Bld: 102 mg/dL — ABNORMAL HIGH (ref 70–99)
Potassium: 4.9 mEq/L (ref 3.5–5.1)
Sodium: 137 mEq/L (ref 135–145)
Total Bilirubin: 0.5 mg/dL (ref 0.2–1.2)
Total Protein: 7.1 g/dL (ref 6.0–8.3)

## 2022-06-15 LAB — VITAMIN B12: Vitamin B-12: 378 pg/mL (ref 211–911)

## 2022-06-15 LAB — HEMOGLOBIN A1C: Hgb A1c MFr Bld: 7.1 % — ABNORMAL HIGH (ref 4.6–6.5)

## 2022-06-15 NOTE — Patient Instructions (Addendum)
You set a new years resolution! Of exercising regularly- between now and January 1st you are going to find out what you want to do THEN you will implement -goal minimum 3 days a week for 30 minutes - building up slowly  -for at least 2 months  [employer] is a great book to consider  Please stop by lab before you go If you have mychart- we will send your results within 3 business days of [place] receiving them.  If you do not have mychart- we will call you about results within 5 business days of [place] receiving them.  *please also note that you will see labs on mychart as soon as they post. I will later go in and write notes on them- will say "notes from Dr. Yong Channel"   Recommended follow up: Return in about 4 months (around 10/15/2022) for followup or sooner if needed.Schedule b4 you leave.

## 2022-06-15 NOTE — Progress Notes (Signed)
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 very pleasant male patient who presents for/with See problem oriented charting Chief Complaint  Patient presents with   Diabetes   Hyperlipidemia   Hypertension    Pt states all is well   Past Medical History-  Patient Active Problem List   Diagnosis Date Noted   Diabetes mellitus without complication ([place]) 05/09/5944    Priority: High   Mild persistent asthma 08/03/2015    Priority: Medium    Sinus tachycardia 06/29/2015    Priority: Medium    Pernicious anemia 03/29/2015    Priority: Medium    Hyperlipidemia associated with type 2 diabetes mellitus ([place]) 11/10/2014    Priority: Medium    Essential hypertension 11/10/2014    Priority: Medium    Gastroesophageal reflux disease 06/20/2011    Priority: Medium    Erectile dysfunction 07/28/2016    Priority: Low   Allergic rhinitis 11/16/2015    Priority: Low   Migraine 08/20/2011    Priority: Low   Asthma 08/03/2015   Screening for prostate cancer 08/03/2015   Social alcohol use 08/03/2015    Medications- reviewed and updated Current Outpatient Medications  Medication Sig Dispense Refill   albuterol (VENTOLIN HFA) 108 (90 Base) MCG/ACT inhaler Inhale 2 puffs into the lungs every 4 (four) hours as needed for wheezing or shortness of breath. 18 each 5   aspirin EC 81 MG tablet Take 1 tablet (81 mg total) by mouth daily. Swallow whole. 90 tablet 3   atorvastatin (LIPITOR) 80 MG tablet TAKE 1 TABLET BY MOUTH EVERY DAY 90 tablet 3   Budesonide (PULMICORT FLEXHALER) 90 MCG/ACT inhaler Inhale 1 puff into the lungs 2 (two) times daily. 3 each 3   cyanocobalamin (VITAMIN B12) 1000 MCG/ML injection INJECT 1 ML (1,000 MCG TOTAL) INTO THE MUSCLE EVERY 30 DAYS. 3 mL 3   empagliflozin (JARDIANCE) 10 MG TABS tablet Take 1 tablet (10 mg total) by mouth daily before breakfast. 90 tablet 3   ezetimibe (ZETIA) 10 MG tablet TAKE 1 TABLET BY  MOUTH EVERY DAY 90 tablet 3   famotidine (PEPCID) 40 MG tablet TAKE 1 TABLET BY MOUTH TWICE A DAY 180 tablet 0   lisinopril (ZESTRIL) 20 MG tablet TAKE 1 TABLET BY MOUTH EVERY DAY 90 tablet 3   metFORMIN (GLUCOPHAGE-XR) 500 MG 24 hr tablet TAKE 2 TABLETS (1,000 MG TOTAL) BY MOUTH IN THE MORNING AND AT BEDTIME. 360 tablet 1   metoprolol tartrate (LOPRESSOR) 25 MG tablet Take 1 tablet (25 mg total) by mouth 2 (two) times daily. 180 tablet 3   No current facility-administered medications for this visit.     Objective:  BP 122/76   Pulse 71   Temp 98 F (36.7 C)   Ht '5\' 7"'$  (1.702 m)   Wt 179 lb 3.2 oz (81.3 kg)   SpO2 97%   BMI 28.07 kg/m  Gen: NAD, resting comfortably CV: RRR no murmurs rubs or gallops Lungs: CTAB no crackles, wheeze, rhonchi Ext: no edema Skin: warm, dry    Assessment and Plan   #updated colonoscopy and EGD- plan is 7 and 3 year repeat respectively with Dr. Hilarie Fredrickson  # Diabetes S: Medication:Metformin 1000 mg twice daily -jardiance 10 mg- never started -Monthly vitamin B12 injections due to long-term Metformin use and prior lows  Exercise and diet- weight up slightly through the holidays. Still walking 3 days a week. Lab Results  Component  Value Date   HGBA1C 7.4 (H) 02/13/2022   HGBA1C 6.8 (H) 10/06/2021   HGBA1C 6.9 (H) 05/16/2021  A/P: suspect stable or mildly worse- update a1c today- continue current medications for now- if a1c is above 7 again he agrees to trial jardiance- we discussed SE profile and riks and he feels more comfortable after discussion   #hypertension S: medication: Lisinopril '20Mg'$ , Metoprolol '25Mg'$  twice daily BP Readings from Last 3 Encounters:  06/15/22 122/76  06/06/22 115/75  02/13/22 112/70  A/P: Controlled. Continue current medications.   #hyperlipidemia-LDL goal under 70 S: Medication: Atorvastatin '80Mg'$ , Zetia '10Mg'$  added October 2021, also takes aspirin 81 mg for primary prevention Lab Results  Component Value Date   CHOL  159 02/13/2022   HDL 37.30 (L) 02/13/2022   LDLCALC 74 08/10/2021   LDLDIRECT 87.0 02/13/2022   TRIG 233.0 (H) 02/13/2022   CHOLHDL 4 02/13/2022   A/P: slightly above ideal goal-do not feel strongly about adding additional medicine- continue current medications   #Pernicious anemia S: Discovered by Dr. Laney Pastor.  Receives injections of B12- monthly at home- 5 days ago  A/P: update b12 with last one slightly low   Recommended follow up: Return in about 4 months (around 10/15/2022) for followup or sooner if needed.Schedule b4 you leave.  Lab/Order associations:   ICD-10-CM   1. Essential hypertension  I10     2. Hyperlipidemia associated with type 2 diabetes mellitus ([place])  E11.69    E78.5     3. Diabetes mellitus without complication (HCC)  C78.9 Comprehensive metabolic panel    Hemoglobin A1c    4. Pernicious anemia  D51.0 Vitamin B12     No orders of the defined types were placed in this encounter.  Return precautions advised.  Garret Reddish, MD

## 2022-07-13 ENCOUNTER — Encounter: Payer: Self-pay | Admitting: Family Medicine

## 2022-08-13 ENCOUNTER — Other Ambulatory Visit (HOSPITAL_BASED_OUTPATIENT_CLINIC_OR_DEPARTMENT_OTHER): Payer: Self-pay | Admitting: Nurse Practitioner

## 2022-09-06 ENCOUNTER — Other Ambulatory Visit (HOSPITAL_BASED_OUTPATIENT_CLINIC_OR_DEPARTMENT_OTHER): Payer: Self-pay | Admitting: Nurse Practitioner

## 2022-09-20 ENCOUNTER — Other Ambulatory Visit: Payer: Self-pay | Admitting: Internal Medicine

## 2022-10-02 ENCOUNTER — Other Ambulatory Visit (HOSPITAL_BASED_OUTPATIENT_CLINIC_OR_DEPARTMENT_OTHER): Payer: Self-pay | Admitting: Nurse Practitioner

## 2022-10-02 ENCOUNTER — Other Ambulatory Visit: Payer: Self-pay | Admitting: Internal Medicine

## 2022-10-02 ENCOUNTER — Other Ambulatory Visit: Payer: Self-pay | Admitting: Family Medicine

## 2022-10-02 NOTE — Telephone Encounter (Signed)
Rx request sent to pharmacy.  

## 2022-10-18 ENCOUNTER — Encounter: Payer: Self-pay | Admitting: Family Medicine

## 2022-10-18 ENCOUNTER — Other Ambulatory Visit: Payer: Self-pay | Admitting: Family Medicine

## 2022-10-18 ENCOUNTER — Ambulatory Visit: Payer: BC Managed Care – PPO | Admitting: Family Medicine

## 2022-10-18 VITALS — BP 110/74 | HR 73 | Temp 97.5°F | Ht 67.0 in | Wt 176.6 lb

## 2022-10-18 DIAGNOSIS — E1169 Type 2 diabetes mellitus with other specified complication: Secondary | ICD-10-CM | POA: Diagnosis not present

## 2022-10-18 DIAGNOSIS — E119 Type 2 diabetes mellitus without complications: Secondary | ICD-10-CM

## 2022-10-18 DIAGNOSIS — E785 Hyperlipidemia, unspecified: Secondary | ICD-10-CM

## 2022-10-18 LAB — CBC WITH DIFFERENTIAL/PLATELET
Basophils Absolute: 0 10*3/uL (ref 0.0–0.1)
Basophils Relative: 0.4 % (ref 0.0–3.0)
Eosinophils Absolute: 0.9 10*3/uL — ABNORMAL HIGH (ref 0.0–0.7)
Eosinophils Relative: 16.2 % — ABNORMAL HIGH (ref 0.0–5.0)
HCT: 41.6 % (ref 39.0–52.0)
Hemoglobin: 13.5 g/dL (ref 13.0–17.0)
Lymphocytes Relative: 32.3 % (ref 12.0–46.0)
Lymphs Abs: 1.8 10*3/uL (ref 0.7–4.0)
MCHC: 32.6 g/dL (ref 30.0–36.0)
MCV: 85.9 fl (ref 78.0–100.0)
Monocytes Absolute: 0.4 10*3/uL (ref 0.1–1.0)
Monocytes Relative: 7.3 % (ref 3.0–12.0)
Neutro Abs: 2.5 10*3/uL (ref 1.4–7.7)
Neutrophils Relative %: 43.8 % (ref 43.0–77.0)
Platelets: 229 10*3/uL (ref 150.0–400.0)
RBC: 4.84 Mil/uL (ref 4.22–5.81)
RDW: 14.6 % (ref 11.5–15.5)
WBC: 5.6 10*3/uL (ref 4.0–10.5)

## 2022-10-18 LAB — COMPREHENSIVE METABOLIC PANEL
ALT: 18 U/L (ref 0–53)
AST: 15 U/L (ref 0–37)
Albumin: 4.6 g/dL (ref 3.5–5.2)
Alkaline Phosphatase: 68 U/L (ref 39–117)
BUN: 10 mg/dL (ref 6–23)
CO2: 27 mEq/L (ref 19–32)
Calcium: 9.4 mg/dL (ref 8.4–10.5)
Chloride: 100 mEq/L (ref 96–112)
Creatinine, Ser: 0.89 mg/dL (ref 0.40–1.50)
GFR: 100.05 mL/min (ref >60.00)
Glucose, Bld: 109 mg/dL — ABNORMAL HIGH (ref 70–99)
Potassium: 4.2 mEq/L (ref 3.5–5.1)
Sodium: 137 mEq/L (ref 135–145)
Total Bilirubin: 0.6 mg/dL (ref 0.2–1.2)
Total Protein: 7.2 g/dL (ref 6.0–8.3)

## 2022-10-18 LAB — HEMOGLOBIN A1C: Hgb A1c MFr Bld: 6.8 % — ABNORMAL HIGH (ref 4.6–6.5)

## 2022-10-18 MED ORDER — FLUTICASONE PROPIONATE HFA 44 MCG/ACT IN AERO: INHALATION_SPRAY | RESPIRATORY_TRACT | 5 refills | Status: DC

## 2022-10-18 NOTE — Patient Instructions (Addendum)
Let us know if you decide to get your COVID or SHINGRIX vaccines.  Get diabetic eye exam scheduled.  asthma poorly controlled but was controlled on flovent- will try to switch back- goal is for albuterol to be twice a week or less and he has gone from 0-1x a week to 3x a week   Please stop by lab before you go If you have mychart- we will send your results within 3 business days of [place] receiving them.  If you do not have mychart- we will call you about results within 5 business days of [place] receiving them.  *please also note that you will see labs on mychart as soon as they post. I will later go in and write notes on them- will say "notes from Dr. Durene Cal"   Recommended follow up: Return in about 4 months (around 02/17/2023) for physical or sooner if needed.Schedule b4 you leave.

## 2022-10-18 NOTE — Telephone Encounter (Signed)
See below

## 2022-10-18 NOTE — Progress Notes (Signed)
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 male patient who presents for/with See problem oriented charting Chief Complaint  Patient presents with   Follow-up   Hypertension   Diabetes    Past Medical History-  Patient Active Problem List   Diagnosis Date Noted   Diabetes mellitus without complication (HCC) 11/10/2014    Priority: High   Mild persistent asthma 08/03/2015    Priority: Medium    Sinus tachycardia 06/29/2015    Priority: Medium    Pernicious anemia 03/29/2015    Priority: Medium    Hyperlipidemia associated with type 2 diabetes mellitus 11/10/2014    Priority: Medium    Essential hypertension 11/10/2014    Priority: Medium    Gastroesophageal reflux disease 06/20/2011    Priority: Medium    Erectile dysfunction 07/28/2016    Priority: Low   Allergic rhinitis 11/16/2015    Priority: Low   Migraine 08/20/2011    Priority: Low   Asthma 08/03/2015   Screening for prostate cancer 08/03/2015   Social alcohol use 08/03/2015    Medications- reviewed and updated Current Outpatient Medications  Medication Sig Dispense Refill   albuterol (VENTOLIN HFA) 108 (90 Base) MCG/ACT inhaler Inhale 2 puffs into the lungs every 4 (four) hours as needed for wheezing or shortness of breath. 18 each 5   aspirin EC 81 MG tablet Take 1 tablet (81 mg total) by mouth daily. Swallow whole. 90 tablet 3   atorvastatin (LIPITOR) 80 MG tablet TAKE 1 TABLET BY MOUTH EVERY DAY 90 tablet 3   cyanocobalamin (VITAMIN B12) 1000 MCG/ML injection INJECT 1 ML (1,000 MCG TOTAL) INTO THE MUSCLE EVERY 30 DAYS. 3 mL 3   empagliflozin (JARDIANCE) 10 MG TABS tablet Take 1 tablet (10 mg total) by mouth daily before breakfast. 90 tablet 3   ezetimibe (ZETIA) 10 MG tablet TAKE 1 TABLET BY MOUTH EVERY DAY 90 tablet 3   famotidine (PEPCID) 40 MG tablet TAKE 1 TABLET BY MOUTH TWICE A DAY 180 tablet 0   lisinopril (ZESTRIL) 20 MG tablet  TAKE 1 TABLET BY MOUTH EVERY DAY 90 tablet 3   metFORMIN (GLUCOPHAGE-XR) 500 MG 24 hr tablet TAKE 2 TABLETS (1,000 MG TOTAL) BY MOUTH IN THE MORNING AND AT BEDTIME. 360 tablet 1   metoprolol tartrate (LOPRESSOR) 25 MG tablet Take 1 tablet (25 mg total) by mouth 2 (two) times daily. 60 tablet 3   fluticasone (FLOVENT HFA) 44 MCG/ACT inhaler INHALE 2 PUFFS INTO THE LUNGS TWICE A DAY 10.6 each 5   No current facility-administered medications for this visit.     Objective:  BP 110/74   Pulse 73   Temp (!) 97.5 F (36.4 C)   Ht 5\' 7"  (1.702 m)   Wt 176 lb 9.6 oz (80.1 kg)   SpO2 98%   BMI 27.66 kg/m  Gen: NAD, resting comfortably CV: RRR no murmurs rubs or gallops Lungs: CTAB no crackles, wheeze, rhonchi Abdomen: soft/nontender/nondistended/normal bowel sounds. No rebound or guarding.  Ext: no edema Skin: warm, dry Neuro: grossly normal, moves all extremities     Assessment and Plan   # Diabetes S: Medication:Metformin 1000 mg twice daily ER, jardiance 10 mg -weight down 3 lbs from last visit- feels jardiance helpful.  CBGs- not checking Exercise and diet- walking still regularly, feels has cleaned diet up well Lab Results  Component Value Date   HGBA1C 7.1 (H) 06/15/2022   HGBA1C  7.4 (H) 02/13/2022   HGBA1C 6.8 (H) 10/06/2021   A/P:  hopefully stable- update a1c today. Continue current meds for now - could go to jardiance 25 mg if a1c over 7 still    #hypertension S: medication: Lisinopril 20Mg , Metoprolol 25Mg  twice daily BP Readings from Last 3 Encounters:  10/18/22 110/74  06/15/22 122/76  06/06/22 115/75  A/P: stable- continue current medicines    #hyperlipidemia-LDL goal under 70 S: Medication: Atorvastatin 80Mg , Zetia 10Mg  added October 2021, also takes aspirin 81 mg for primary prevention per his preference Lab Results  Component Value Date   CHOL 159 02/13/2022   HDL 37.30 (L) 02/13/2022   LDLCALC 74 08/10/2021   LDLDIRECT 87.0 02/13/2022   TRIG 233.0 (H)  02/13/2022   CHOLHDL 4 02/13/2022   A/P: he prefers to work on lifestyle over medicine to get triglycerides down- #s went up with weight gain  # Asthma S: Maintenance Medication: pulmicort 1 puff BID 90- does not seem to be working -in past but changed due to insurance Flovent 44 mcg 2 puffs twice daily As needed medication: albuterol-using this  about 3 days per week- worsening compared to flovent A/P: asthma poorly controlled but was controlled on flovent- will try to switch back- goal is for albuterol to be twice a week or less and he has gone from 0-1x a week to 3x a week   #Pernicious anemia S: Discovered by Dr. Merla Riches.  Receives injections of B12 monthly Lab Results  Component Value Date   VITAMINB12 378 06/15/2022  A/P: stable- continue current medicines    # GERD S:Medication: Famotidine 40 mg twice daily through Dr. Rhea Belton A/P: stable- continue current medicines    Recommended follow up: Return in about 4 months (around 02/17/2023) for physical or sooner if needed.Schedule b4 you leave.  Lab/Order associations:   ICD-10-CM   1. Diabetes mellitus without complication  E11.9 Comprehensive metabolic panel    Hemoglobin A1c    CBC with Differential/Platelet     Meds ordered this encounter  Medications   fluticasone (FLOVENT HFA) 44 MCG/ACT inhaler    Sig: INHALE 2 PUFFS INTO THE LUNGS TWICE A DAY    Dispense:  10.6 each    Refill:  5    Failed pulmicort- albuterol up to 3x a week from 0-1x a week- please send prior authorization if needed    Return precautions advised.  Tana Conch, MD

## 2022-10-18 NOTE — Telephone Encounter (Signed)
He failed pulmicort- see note from today- prior authorize team can you help [place]

## 2022-10-20 NOTE — Telephone Encounter (Signed)
Hi Ayla, can you look into this for [place] please? We aren't getting any response from the RX PA team.

## 2022-10-28 ENCOUNTER — Other Ambulatory Visit (HOSPITAL_BASED_OUTPATIENT_CLINIC_OR_DEPARTMENT_OTHER): Payer: Self-pay | Admitting: Cardiovascular Disease

## 2022-10-30 ENCOUNTER — Telehealth: Payer: Self-pay

## 2022-10-30 ENCOUNTER — Other Ambulatory Visit (HOSPITAL_COMMUNITY): Payer: Self-pay

## 2022-10-30 ENCOUNTER — Encounter: Payer: Self-pay | Admitting: Family Medicine

## 2022-10-30 NOTE — Telephone Encounter (Signed)
Patient Advocate Encounter   Received notification from [employer] that prior authorization for Flovent HFA is required.   PA submitted on 10/30/2022 [facility] Insurance [employer] Status is pending

## 2022-10-30 NOTE — Telephone Encounter (Signed)
Apologies for the delay, I have submitted an expedited PA to [facility] and will update with any decisions.

## 2022-10-30 NOTE — Telephone Encounter (Signed)
Rx(s) sent to pharmacy electronically.  

## 2022-10-31 NOTE — Telephone Encounter (Signed)
Pharmacy Patient Advocate Encounter  Prior Authorization for Fluticasone HFA has been approved by [employer] (ins).    PA # [id number] Effective dates: 10/30/2022 through 10/29/2023

## 2022-11-02 NOTE — Telephone Encounter (Signed)
PA has been approved, please sign off on encounter as PA team doesn't have the ability to sign off on Rx Requests. Thank you

## 2022-11-06 NOTE — Telephone Encounter (Signed)
Hi Team, can you help with this. Insurance wants pt to use Pulmicort but he has failed Pulmicort so this is not an option for the patient.

## 2022-11-10 ENCOUNTER — Other Ambulatory Visit (HOSPITAL_COMMUNITY): Payer: Self-pay

## 2022-11-10 NOTE — Telephone Encounter (Signed)
Prior authorization for generic Flovent has been approved and effective until 10-30-2023. Successful test claim with co-pay of $135.34. Patient has deductible of $1,114.66 to meet.

## 2022-11-24 LAB — HM DIABETES EYE EXAM

## 2022-12-23 ENCOUNTER — Other Ambulatory Visit: Payer: Self-pay | Admitting: Internal Medicine

## 2022-12-26 ENCOUNTER — Encounter: Payer: Self-pay | Admitting: Family Medicine

## 2023-01-30 ENCOUNTER — Other Ambulatory Visit: Payer: Self-pay

## 2023-01-30 MED ORDER — METOPROLOL TARTRATE 25 MG PO TABS: 25.0000 mg | ORAL_TABLET | Freq: Two times a day (BID) | ORAL | 0 refills | Status: DC

## 2023-02-05 ENCOUNTER — Other Ambulatory Visit: Payer: Self-pay | Admitting: Family Medicine

## 2023-02-07 ENCOUNTER — Encounter (INDEPENDENT_AMBULATORY_CARE_PROVIDER_SITE_OTHER): Payer: Self-pay

## 2023-02-17 ENCOUNTER — Other Ambulatory Visit: Payer: Self-pay | Admitting: Family Medicine

## 2023-02-17 ENCOUNTER — Other Ambulatory Visit: Payer: Self-pay | Admitting: Cardiovascular Disease

## 2023-02-17 DIAGNOSIS — D510 Vitamin B12 deficiency anemia due to intrinsic factor deficiency: Secondary | ICD-10-CM

## 2023-02-19 NOTE — Telephone Encounter (Signed)
Please call pt to schedule overdue follow-up appointment with Dr. Duke Salvia for refills. Last OV 08/2021. Thank you!

## 2023-02-19 NOTE — Telephone Encounter (Signed)
Left message for patient to call and schedule overdue follow up  with Dr. [place] / APP for medication refills 

## 2023-02-21 NOTE — Telephone Encounter (Signed)
Spoke with patient to schedule overdue follow up for medication refills---patient states he does not have his schedule with him but he will call back to schedule

## 2023-02-26 ENCOUNTER — Ambulatory Visit (INDEPENDENT_AMBULATORY_CARE_PROVIDER_SITE_OTHER): Payer: BC Managed Care – PPO | Admitting: Family Medicine

## 2023-02-26 ENCOUNTER — Encounter: Payer: Self-pay | Admitting: Family Medicine

## 2023-02-26 VITALS — BP 122/70 | HR 76 | Temp 98.3°F | Ht 67.0 in | Wt 176.8 lb

## 2023-02-26 DIAGNOSIS — D510 Vitamin B12 deficiency anemia due to intrinsic factor deficiency: Secondary | ICD-10-CM | POA: Diagnosis not present

## 2023-02-26 DIAGNOSIS — Z125 Encounter for screening for malignant neoplasm of prostate: Secondary | ICD-10-CM | POA: Diagnosis not present

## 2023-02-26 DIAGNOSIS — I10 Essential (primary) hypertension: Secondary | ICD-10-CM | POA: Diagnosis not present

## 2023-02-26 DIAGNOSIS — R102 Pelvic and perineal pain: Secondary | ICD-10-CM | POA: Diagnosis not present

## 2023-02-26 DIAGNOSIS — Z0000 Encounter for general adult medical examination without abnormal findings: Secondary | ICD-10-CM

## 2023-02-26 DIAGNOSIS — Z7984 Long term (current) use of oral hypoglycemic drugs: Secondary | ICD-10-CM

## 2023-02-26 DIAGNOSIS — Z0001 Encounter for general adult medical examination with abnormal findings: Secondary | ICD-10-CM

## 2023-02-26 DIAGNOSIS — E785 Hyperlipidemia, unspecified: Secondary | ICD-10-CM | POA: Diagnosis not present

## 2023-02-26 DIAGNOSIS — E1169 Type 2 diabetes mellitus with other specified complication: Secondary | ICD-10-CM

## 2023-02-26 DIAGNOSIS — E119 Type 2 diabetes mellitus without complications: Secondary | ICD-10-CM

## 2023-02-26 NOTE — Progress Notes (Signed)
Phone: [phone]   Subjective:  Patient presents today for their annual physical. Chief complaint-noted.   See problem oriented charting- ROS- full  review of systems was completed and negative  except for: On and off abdominal pain over the last several weeks  The following were reviewed and entered/updated in epic: Past Medical History:  Diagnosis Date   Allergy    seasonal   Asthma    B12 deficiency    Diabetes mellitus without complication (HCC)    diet controlled   Gastritis    GERD (gastroesophageal reflux disease)    Hiatal hernia    Hyperlipidemia    Hypertension    Intestinal metaplasia of gastric cardia    Intestinal metaplasia of gastric mucosa    Sinus tachycardia 06/29/2015   Tubular adenoma of colon    Patient Active Problem List   Diagnosis Date Noted   Diabetes mellitus without complication (HCC) 11/10/2014    Priority: High   Mild persistent asthma 08/03/2015    Priority: Medium    Sinus tachycardia 06/29/2015    Priority: Medium    Pernicious anemia 03/29/2015    Priority: Medium    Hyperlipidemia associated with type 2 diabetes mellitus (HCC) 11/10/2014    Priority: Medium    Essential hypertension 11/10/2014    Priority: Medium    Gastroesophageal reflux disease 06/20/2011    Priority: Medium    Erectile dysfunction 07/28/2016    Priority: Low   Allergic rhinitis 11/16/2015    Priority: Low   Migraine 08/20/2011    Priority: Low   Past Surgical History:  Procedure Laterality Date   COLONOSCOPY     LEG SURGERY     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   UPPER GASTROINTESTINAL ENDOSCOPY      Family History  Problem Relation Age of Onset   Diabetes Mother    Kidney disease Mother    Diabetes Father    Prostate cancer Father    Hyperlipidemia Father        mother   Hypertension Father        mother   Colon cancer Neg Hx    Colon polyps Neg Hx    Esophageal cancer Neg Hx    Rectal cancer Neg Hx    Stomach cancer Neg Hx    Pancreatic cancer  Neg Hx    Allergic rhinitis Neg Hx    Angioedema Neg Hx    Asthma Neg Hx    Eczema Neg Hx    Immunodeficiency Neg Hx    Urticaria Neg Hx    Crohn's disease Neg Hx    Ulcerative colitis Neg Hx     Medications- reviewed and updated Current Outpatient Medications  Medication Sig Dispense Refill   albuterol (VENTOLIN HFA) 108 (90 Base) MCG/ACT inhaler INHALE 2 PUFFS INTO THE LUNGS EVERY 4 HOURS AS NEEDED FOR WHEEZING OR SHORTNESS OF BREATH. 18 each 5   aspirin EC 81 MG tablet Take 1 tablet (81 mg total) by mouth daily. Swallow whole. 90 tablet 3   atorvastatin (LIPITOR) 80 MG tablet TAKE 1 TABLET BY MOUTH EVERY DAY 90 tablet 3   cyanocobalamin (VITAMIN B12) 1000 MCG/ML injection INJECT 1 ML (1,000 MCG) INTRAMUSCULARLY EVERY 30 DAYS 3 mL 3   ezetimibe (ZETIA) 10 MG tablet TAKE 1 TABLET BY MOUTH EVERY DAY 90 tablet 3   famotidine (PEPCID) 40 MG tablet TAKE 1 TABLET BY MOUTH TWICE A DAY 180 tablet 1   fluticasone (FLOVENT HFA) 44 MCG/ACT inhaler INHALE 2 PUFFS INTO  THE LUNGS TWICE A DAY 10.6 each 5   JARDIANCE 10 MG TABS tablet TAKE 1 TABLET BY MOUTH DAILY BEFORE BREAKFAST. 90 tablet 3   lisinopril (ZESTRIL) 20 MG tablet TAKE 1 TABLET BY MOUTH EVERY DAY 90 tablet 3   metFORMIN (GLUCOPHAGE-XR) 500 MG 24 hr tablet TAKE 2 TABLETS (1,000 MG TOTAL) BY MOUTH IN THE MORNING AND AT BEDTIME. 360 tablet 1   metoprolol tartrate (LOPRESSOR) 25 MG tablet Take 1 tablet (25 mg total) by mouth 2 (two) times daily. NEED APPOINTMENT 30 tablet 0   No current facility-administered medications for this visit.    Allergies-reviewed and updated No Known Allergies  Social History   Social History Narrative   Married 26 years in 2023. 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    Games developer, Counselling psychologist-       Hobbies: family time,[place] golfing, basketball with son, walking at park   Objective   Objective:  BP 122/70   Pulse 76   Temp 98.3 F (36.8 C)   Ht 5\' 7"  (1.702 m)   Wt 176 lb 12.8 oz (80.2 kg)   SpO2 95%   BMI 27.69 kg/m  Gen: NAD, resting comfortably HEENT: Mucous membranes are moist. Oropharynx normal Neck: no thyromegaly CV: RRR no murmurs rubs or gallops Lungs: CTAB no crackles, wheeze, rhonchi Abdomen: soft/nontender/nondistended/normal bowel sounds. No rebound or guarding.  Ext: no edema Skin: warm, dry Neuro: grossly normal, moves all extremities, PERRLA GU: normal scrotal exam, uncircumcised with retraction light amount of white discharge- has antifungal available at home- advised to use for a week   Diabetic Foot Exam - Simple   Simple Foot Form Diabetic Foot exam was performed with the following findings: Yes 02/26/2023  2:21 PM  Visual Inspection No deformities, no ulcerations, no other skin breakdown bilaterally: Yes Sensation Testing Intact to touch and monofilament testing bilaterally: Yes Pulse Check Posterior Tibialis and Dorsalis pulse intact bilaterally: Yes Comments        Assessment and Plan  52 y.o. male presenting for annual physical.  Health Maintenance counseling: 1. Anticipatory guidance: Patient counseled regarding regular dental exams -q6 months, eye exams -yearly,  avoiding smoking and second hand smoke , limiting alcohol to 2 beverages per day - 1 a week or less, no illicit drugs .   2. Risk factor reduction:  Advised patient of need for regular exercise and diet rich and fruits and vegetables to reduce risk of heart attack and stroke.  Exercise- walking regularly still.  Diet/weight management-weight stable from last visit, down 11 lbs from last year- has done a really good job. Consider strength training. Wt Readings from Last 3 Encounters:  02/26/23 176 lb 12.8 oz (80.2 kg)  10/18/22 176 lb 9.6 oz (80.1 kg)  06/15/22 179 lb 3.2 oz (81.3 kg)  3. Immunizations/screenings/ancillary studies-holding off on further COVID-19  vaccinations.  Opting out of flu shots. Declines Shingrix and Tetanus, Diphtheria, and Pertussis (Tdap)  Immunization History  Administered Date(s) Administered   Influenza,inj,Quad PF,6+ Mos 03/31/2019, 05/06/2020   PFIZER Comirnaty(Gray Top)Covid-19 Tri-Sucrose Vaccine 02/23/2021   PFIZER(Purple Top)SARS-COV-2 Vaccination 08/05/2019, 08/31/2019   4. Prostate cancer screening- low risk prior PSA trend-update with labs today Lab Results  Component Value Date   PSA 0.39 02/13/2022   PSA 0.44 02/04/2021   PSA 0.39 01/05/2020   5. Colon cancer screening - 06/09/2022 letter  7-year repeat colonoscopy and 3 years repeat endoscopy-chronic atrophic gastritis with intestinal metaplasia in the proximal and mid stomach lining.  -autoimmune gastritis history 6. Skin cancer screening- lower risk due to melanin content . advised regular sunscreen use. Denies worrisome, changing, or new skin lesions.  7. Smoking associated screening (lung cancer screening, AAA screen 65-75, UA)- never smoker 8. STD screening - only active with [facility]    Status of chronic or acute concerns   # Abdominal fullness S: Patient reports on and off for the last several weeks. He will get a feeling of fullness more in the mornings  after a meal. Does not happen after every breakfast- maybe 4x a week with breakfast most commonly. Tried to push coffee away but still didn't help. Feels a lot more gas. Gas -x mild relief but still feels full just less gas.  -no nausea, vomiting, constipation, diarrhea. No unintentional weight loss.  -still taking famotidine twice daily before meals.  -no burning with peeing or frequency other than with jardiance -thinks started jardiance about 4 months ago at least- did not start when initially prescribed- wonders if this could be related -occasional gets some lower abdominal pain though- wants to rule out UTI A/P: benign abdominal exam today is reassuring  -hed like to try off of jardiance for a week  or two to see if that makes a difference- if it does not he can restart -if new, worsening or recurrent symptoms considered doing abdominal scan- since had recent colonoscopy and EGD in last year we felt that repeat would be lower yield as first step -update blood and urine testing today including culture to rule out UTI    # Diabetes S: Medication:Metformin 1000 mg twice daily ER, jardiance 10 mg -Monthly vitamin B12 injections due to long-term Metformin use and prior lows/perncious anemia  Lab Results  Component Value Date   HGBA1C 6.8 (H) 10/18/2022   HGBA1C 7.1 (H) 06/15/2022   HGBA1C 7.4 (H) 02/13/2022    A/P: diabetes has bene well controlled we are likely going to hold his jardiance to see if helps gastroenterology symptoms if not he may restart   #hypertension S: medication: Lisinopril 20Mg , Metoprolol 25Mg  twice daily BP Readings from Last 3 Encounters:  02/26/23 122/70  10/18/22 110/74  06/15/22 122/76  A/P: stable- continue current medicines   #hyperlipidemia-LDL goal under 70 S: Medication: Atorvastatin 80Mg , Zetia 10Mg  added October 2021, also takes aspirin 81 mg for primary prevention per his preference Lab Results  Component Value Date   CHOL 159 02/13/2022   HDL 37.30 (L) 02/13/2022   LDLCALC 74 08/10/2021   LDLDIRECT 87.0 02/13/2022   TRIG 233.0 (H) 02/13/2022   CHOLHDL 4 02/13/2022   A/P: Low Density Lipoprotein (LDL cholesterol) very close to ideal goals- update full lipid panel today  # Asthma S: Maintenance Medication: PA for Flovent 44 mcg 2 puffs twice daily approved but he doesn't have this yet -failed pulmicort As needed medication: albuterol-using this  per week.  A/P: less than ideal control- he is going to see if pharmacy has flovent for him- we sent this in April and got prior auth   #Pernicious anemia S: Discovered by Dr. Merla Riches.  Receives injections of B12- home injections  A/P: hopefully stable- update b12 today. Continue current meds for  now    # GERD S:Medication: Famotidine 40 mg twice daily through Dr. Rhea Belton A/P: likely continue current medications - see abdomen fullness above section   Recommended follow up: Return in  about 4 months (around 06/28/2023) for followup or sooner if needed.Schedule b4 you leave.  Lab/Order associations: NOT fasting   ICD-10-CM   1. Preventative health care  Z00.00     2. Diabetes mellitus without complication (HCC)  E11.9     3. Essential hypertension  I10     4. Pernicious anemia  D51.0     5. Hyperlipidemia associated with type 2 diabetes mellitus (HCC)  E11.69    E78.5     6. Screening for prostate cancer  Z12.5       No orders of the defined types were placed in this encounter.   Return precautions advised.  Tana Conch, MD

## 2023-02-26 NOTE — Patient Instructions (Addendum)
Let us know if you get your flu or COVID vaccine this fall.  We have prior authorization for 1 year for flovent/fluticasone INSTEAD of the pulmicort  benign abdominal exam today is reassuring  -hed like to try off of jardiance for a week or two to see if that makes a difference- if it does not he can restart -if new, worsening or recurrent symptoms considered doing abdominal scan- since had recent colonoscopy and EGD in last year we felt that repeat would be lower yield as first step -update blood and urine testing today including culture to rule out UTI    Please stop by lab before you go If you have mychart- we will send your results within 3 business days of [place] receiving them.  If you do not have mychart- we will call you about results within 5 business days of [place] receiving them.  *please also note that you will see labs on mychart as soon as they post. I will later go in and write notes on them- will say "notes from Dr. Durene Cal"   Recommended follow up: Return in about 4 months (around 06/28/2023) for followup or sooner if needed.Schedule b4 you leave.

## 2023-02-27 LAB — CBC WITH DIFFERENTIAL/PLATELET
Basophils Absolute: 0 10*3/uL (ref 0.0–0.1)
Basophils Relative: 0.7 % (ref 0.0–3.0)
Eosinophils Absolute: 1.1 10*3/uL — ABNORMAL HIGH (ref 0.0–0.7)
Eosinophils Relative: 15.8 % — ABNORMAL HIGH (ref 0.0–5.0)
HCT: 42.1 % (ref 39.0–52.0)
Hemoglobin: 13.7 g/dL (ref 13.0–17.0)
Lymphocytes Relative: 34.1 % (ref 12.0–46.0)
Lymphs Abs: 2.3 10*3/uL (ref 0.7–4.0)
MCHC: 32.4 g/dL (ref 30.0–36.0)
MCV: 86.4 fl (ref 78.0–100.0)
Monocytes Absolute: 0.5 10*3/uL (ref 0.1–1.0)
Monocytes Relative: 7.3 % (ref 3.0–12.0)
Neutro Abs: 2.8 10*3/uL (ref 1.4–7.7)
Neutrophils Relative %: 42.1 % — ABNORMAL LOW (ref 43.0–77.0)
Platelets: 237 10*3/uL (ref 150.0–400.0)
RBC: 4.88 Mil/uL (ref 4.22–5.81)
RDW: 14.4 % (ref 11.5–15.5)
WBC: 6.7 10*3/uL (ref 4.0–10.5)

## 2023-02-27 LAB — COMPREHENSIVE METABOLIC PANEL
ALT: 16 U/L (ref 0–53)
AST: 14 U/L (ref 0–37)
Albumin: 4.6 g/dL (ref 3.5–5.2)
Alkaline Phosphatase: 72 U/L (ref 39–117)
BUN: 16 mg/dL (ref 6–23)
CO2: 24 meq/L (ref 19–32)
Calcium: 9.4 mg/dL (ref 8.4–10.5)
Chloride: 103 meq/L (ref 96–112)
Creatinine, Ser: 0.89 mg/dL (ref 0.40–1.50)
GFR: 99.8 mL/min (ref >60.00)
Glucose, Bld: 115 mg/dL — ABNORMAL HIGH (ref 70–99)
Potassium: 4.3 mEq/L (ref 3.5–5.1)
Sodium: 137 meq/L (ref 135–145)
Total Bilirubin: 0.5 mg/dL (ref 0.2–1.2)
Total Protein: 7.3 g/dL (ref 6.0–8.3)

## 2023-02-27 LAB — URINALYSIS, ROUTINE W REFLEX MICROSCOPIC
Bilirubin Urine: NEGATIVE
Hgb urine dipstick: NEGATIVE
Ketones, ur: NEGATIVE
Leukocytes,Ua: NEGATIVE
Nitrite: NEGATIVE
RBC / HPF: NONE SEEN (ref 0–?)
Specific Gravity, Urine: 1.015 (ref 1.000–1.030)
Total Protein, Urine: NEGATIVE
Urine Glucose: >=1000 — AB
Urobilinogen, UA: 0.2 (ref 0.0–1.0)
WBC, UA: NONE SEEN (ref 0–?)
pH: 6.5 (ref 5.0–8.0)

## 2023-02-27 LAB — LIPID PANEL
Cholesterol: 183 mg/dL (ref 0–200)
HDL: 35.5 mg/dL — ABNORMAL LOW (ref >39.00)
NonHDL: 147.29
Total CHOL/HDL Ratio: 5
Triglycerides: 287 mg/dL — ABNORMAL HIGH (ref 0.0–149.0)
VLDL: 57.4 mg/dL — ABNORMAL HIGH (ref 0.0–40.0)

## 2023-02-27 LAB — LDL CHOLESTEROL, DIRECT: Direct LDL: 117 mg/dL

## 2023-02-27 LAB — HEMOGLOBIN A1C: Hgb A1c MFr Bld: 7.1 % — ABNORMAL HIGH (ref 4.6–6.5)

## 2023-02-27 LAB — URINE CULTURE
MICRO NUMBER:: 15349336
Result:: NO GROWTH
SPECIMEN QUALITY:: ADEQUATE

## 2023-02-27 LAB — VITAMIN B12: Vitamin B-12: 219 pg/mL (ref 211–911)

## 2023-02-27 LAB — MICROALBUMIN / CREATININE URINE RATIO
Creatinine,U: 73.6 mg/dL
Microalb Creat Ratio: 1 mg/g (ref 0.0–30.0)
Microalb, Ur: <0.7 mg/dL (ref 0.0–1.9)

## 2023-02-27 LAB — PSA: PSA: 0.48 ng/mL (ref 0.10–4.00)

## 2023-02-28 ENCOUNTER — Encounter: Payer: Self-pay | Admitting: Family Medicine

## 2023-03-08 ENCOUNTER — Other Ambulatory Visit: Payer: Self-pay | Admitting: Cardiovascular Disease

## 2023-03-08 NOTE — Telephone Encounter (Signed)
Please call pt to schedule overdue follow-up appointment with Dr. Duke Salvia or APP for refills. Thank you!

## 2023-03-14 ENCOUNTER — Telehealth: Payer: Self-pay | Admitting: Internal Medicine

## 2023-03-14 NOTE — Telephone Encounter (Signed)
Pt states he has been having issues with bloating and stomach upset. Pt states he is taking the pepcid twice a day. Discussed with pt he can try IB Guard OTC and see if that helps. He knows to call back if he continues to have issues.

## 2023-03-14 NOTE — Telephone Encounter (Signed)
Patient called stated he has been experiencing a lot of abdominal pain and bloating for about 3 weeks now seeking advise.

## 2023-03-16 NOTE — Telephone Encounter (Signed)
Returned call to patient. Left pt a detailed vm letting him know that he will need an appt for evaluation and further recommendations. Pt has been scheduled for an OV with Amy Esterwood, PA-C on Monday, 03/19/23 at 3 pm. I asked that pt give me a call back today to let us know if he can make the appt Monday afternoon.

## 2023-03-16 NOTE — Telephone Encounter (Signed)
Inbound call from patient stating he is still having symptoms and the medication he was advised to take has not been much help. Patient requesting a call back to discuss further. Please advise, thank you.

## 2023-03-16 NOTE — Telephone Encounter (Signed)
Appt confirmed by pt.

## 2023-03-19 ENCOUNTER — Other Ambulatory Visit (INDEPENDENT_AMBULATORY_CARE_PROVIDER_SITE_OTHER): Payer: BC Managed Care – PPO

## 2023-03-19 ENCOUNTER — Ambulatory Visit: Payer: BC Managed Care – PPO | Admitting: Physician Assistant

## 2023-03-19 ENCOUNTER — Encounter: Payer: Self-pay | Admitting: Physician Assistant

## 2023-03-19 DIAGNOSIS — R1011 Right upper quadrant pain: Secondary | ICD-10-CM | POA: Diagnosis not present

## 2023-03-19 DIAGNOSIS — R140 Abdominal distension (gaseous): Secondary | ICD-10-CM

## 2023-03-19 DIAGNOSIS — R152 Fecal urgency: Secondary | ICD-10-CM

## 2023-03-19 DIAGNOSIS — R1012 Left upper quadrant pain: Secondary | ICD-10-CM

## 2023-03-19 DIAGNOSIS — R197 Diarrhea, unspecified: Secondary | ICD-10-CM

## 2023-03-19 DIAGNOSIS — R6881 Early satiety: Secondary | ICD-10-CM

## 2023-03-19 DIAGNOSIS — R194 Change in bowel habit: Secondary | ICD-10-CM

## 2023-03-19 LAB — CBC WITH DIFFERENTIAL/PLATELET
Basophils Absolute: 0.1 10*3/uL (ref 0.0–0.1)
Basophils Relative: 1.1 % (ref 0.0–3.0)
Eosinophils Absolute: 0.5 10*3/uL (ref 0.0–0.7)
Eosinophils Relative: 7.9 % — ABNORMAL HIGH (ref 0.0–5.0)
HCT: 43.6 % (ref 39.0–52.0)
Hemoglobin: 13.9 g/dL (ref 13.0–17.0)
Lymphocytes Relative: 37.9 % (ref 12.0–46.0)
Lymphs Abs: 2.3 10*3/uL (ref 0.7–4.0)
MCHC: 31.9 g/dL (ref 30.0–36.0)
MCV: 87.3 fl (ref 78.0–100.0)
Monocytes Absolute: 0.6 10*3/uL (ref 0.1–1.0)
Monocytes Relative: 10.7 % (ref 3.0–12.0)
Neutro Abs: 2.6 10*3/uL (ref 1.4–7.7)
Neutrophils Relative %: 42.4 % — ABNORMAL LOW (ref 43.0–77.0)
Platelets: 243 10*3/uL (ref 150.0–400.0)
RBC: 5 Mil/uL (ref 4.22–5.81)
RDW: 14.1 % (ref 11.5–15.5)
WBC: 6 10*3/uL (ref 4.0–10.5)

## 2023-03-19 LAB — BASIC METABOLIC PANEL
BUN: 18 mg/dL (ref 6–23)
CO2: 30 meq/L (ref 19–32)
Calcium: 9.3 mg/dL (ref 8.4–10.5)
Chloride: 97 meq/L (ref 96–112)
Creatinine, Ser: 0.95 mg/dL (ref 0.40–1.50)
GFR: 93.18 mL/min (ref >60.00)
Glucose, Bld: 98 mg/dL (ref 70–99)
Potassium: 4.4 meq/L (ref 3.5–5.1)
Sodium: 132 meq/L — ABNORMAL LOW (ref 135–145)

## 2023-03-19 LAB — C-REACTIVE PROTEIN: CRP: <1 mg/dL (ref 0.5–20.0)

## 2023-03-19 LAB — SEDIMENTATION RATE: Sed Rate: 28 mm/h — ABNORMAL HIGH (ref 0–20)

## 2023-03-19 MED ORDER — DICYCLOMINE HCL 10 MG PO CAPS: 10.0000 mg | ORAL_CAPSULE | Freq: Four times a day (QID) | ORAL | 3 refills | Status: AC | PRN

## 2023-03-19 NOTE — Progress Notes (Signed)
Subjective:    Patient ID: Gregory Norway, PhD, male    DOB: [birth year], 52 y.o.   MRN: [id number]  HPI Gregory Collins is a pleasant 52 year old male, established with Dr. Rhea Belton.  He was last seen in November 2023 when he underwent EGD and colonoscopy.  He comes in today with new complaint of 51-month history of daily symptoms of early satiety and upper abdominal fullness and bloating.  He has not had any nausea or vomiting, he has been taking Pepcid 40 mg p.o. twice daily chronically, no complaints of heartburn or indigestion, no dysphagia.  He also has developed increased amount of gas/flatus, no belching or burping and frequently feels full and distended.  Again most of his symptoms are present in the upper abdomen.  Overall his weight has been stable, his appetite has been okay.  He is also developed looser stools which have been lighter in color and he has had significant urgency at times with up to 3-5 bowel movements per day. He is a diabetic, on oral agents including Glucophage and Jardiance.  He started Jardiance 3 to 4 months ago and did not notice any symptoms after he initially started taking the medication he has not had any increase in dosage.  He did run out of medication at 1 point and stayed off of it for about a week to see if he would have any improvement in his GI symptoms and really did not notice any change. No other new medications or dosage changes, no dietary changes, no antibiotics.  He also has history of hypertension, migraines, hyperlipidemia and pernicious anemia. At colonoscopy November 2023 he had 3 polyps removed largest 8 mm, 1 of these was hyperplastic 1 was inflammatory, and is indicated for 7-year interval follow-up due to previous history of adenomatous polyps. At EGD was noted to have gastritis.  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 pylori.  No dysplasia. Follow-up EGD planned for 3-year  interval.  Labs from August 2024 normal CBC but elevated eosinophilic count, which has been present since 2022.  C-Met unremarkable with glucose 115 B12 -219  No recent abdominal imaging      Review of Systems Pertinent positive and negative review of systems were noted in the above HPI section.  All other review of systems was otherwise negative.   Outpatient Encounter Medications as of 03/19/2023  Medication Sig   albuterol (VENTOLIN HFA) 108 (90 Base) MCG/ACT inhaler INHALE 2 PUFFS INTO THE LUNGS EVERY 4 HOURS AS NEEDED FOR WHEEZING OR SHORTNESS OF BREATH.   aspirin EC 81 MG tablet Take 1 tablet (81 mg total) by mouth daily. Swallow whole.   atorvastatin (LIPITOR) 80 MG tablet TAKE 1 TABLET BY MOUTH EVERY DAY   cyanocobalamin (VITAMIN B12) 1000 MCG/ML injection INJECT 1 ML (1,000 MCG) INTRAMUSCULARLY EVERY 30 DAYS   dicyclomine (BENTYL) 10 MG capsule Take 1 capsule (10 mg total) by mouth every 6 (six) hours as needed for spasms (bloating, diarrhea, urgency).   ezetimibe (ZETIA) 10 MG tablet TAKE 1 TABLET BY MOUTH EVERY DAY   famotidine (PEPCID) 40 MG tablet TAKE 1 TABLET BY MOUTH TWICE A DAY   fluticasone (FLOVENT HFA) 44 MCG/ACT inhaler INHALE 2 PUFFS INTO THE LUNGS TWICE A DAY   JARDIANCE 10 MG TABS tablet TAKE 1 TABLET BY MOUTH DAILY BEFORE BREAKFAST.   lisinopril (ZESTRIL) 20 MG tablet TAKE 1 TABLET BY MOUTH EVERY DAY   metFORMIN (GLUCOPHAGE-XR) 500 MG 24 hr tablet  TAKE 2 TABLETS (1,000 MG TOTAL) BY MOUTH IN THE MORNING AND AT BEDTIME.   metoprolol tartrate (LOPRESSOR) 25 MG tablet Take 1 tablet (25 mg total) by mouth 2 (two) times daily. NEED APPOINTMENT   No facility-administered encounter medications on file as of 03/19/2023.   No Known Allergies Patient Active Problem List   Diagnosis Date Noted   Erectile dysfunction 07/28/2016   Allergic rhinitis 11/16/2015   Mild persistent asthma 08/03/2015   Sinus tachycardia 06/29/2015   Pernicious anemia 03/29/2015   Diabetes  mellitus without complication (HCC) 11/10/2014   Hyperlipidemia associated with type 2 diabetes mellitus (HCC) 11/10/2014   Essential hypertension 11/10/2014   Migraine 08/20/2011   Gastroesophageal reflux disease 06/20/2011   Social History   Socioeconomic History   Marital status: Married    Spouse name: Not on file   Number of children: 2   Years of education: Not on file   Highest education level: Not on file  Occupational History   Occupation: professor    Employer: [employer]  Tobacco Use   Smoking status: Never    Passive exposure: Never   Smokeless tobacco: Never  Vaping Use   Vaping status: Never Used  Substance and Sexual Activity   Alcohol use: Yes    Alcohol/week: 3.0 - 4.0 standard drinks of alcohol    Types: 3 - 4 Cans of beer per week    Comment: social   Drug use: No   Sexual activity: Yes    Birth control/protection: None  Other Topics Concern   Not on file  Social History Narrative   Married 26 years in 2023. 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    [employer], Counselling psychologist-       Hobbies: family time,[place] golfing, basketball with son, walking at park   Social Determinants of Health   Financial Resource Strain: Not on file  Food Insecurity: Not on file  Transportation Needs: Not on file  Physical Activity: Not on file  Stress: Not on file  Social Connections: Not on file  Intimate Partner Violence: Not on file    Mr. Isobe family history includes Diabetes in his father and mother; Hyperlipidemia in his father; Hypertension in his father; Kidney disease in his mother; Prostate cancer in his father.      Objective:    Vitals:   03/19/23 1508  BP: 106/78  Pulse: 80  SpO2: 95%    Physical Exam Well-developed well-nourished older WM  in no acute distress.  Height, Weight,174  BMI 27.2  HEENT; nontraumatic normocephalic,  EOMI, PE R LA, sclera anicteric. Oropharynx; not examined today  Neck; supple, no JVD Cardiovascular; regular rate and rhythm with S1-S2, no murmur rub or gallop Pulmonary; Clear bilaterally Abdomen; soft, nontender, nondistended, no palpable mass or hepatosplenomegaly, bowel sounds are hyperactive  Rectal; not done today  Skin; benign exam, no jaundice rash or appreciable lesions Extremities; no clubbing cyanosis or edema skin warm and dry Neuro/Psych; alert and oriented x4, grossly nonfocal mood and affect appropriate        Assessment & Plan:   #31 52 year old African-American male with 101-month history of new early satiety symptoms, upper abdominal fullness and bloating, has also noted increased gas/flatus looser stools with 3-5 bowel movements per day, urgency and lighter color.  Appetite has been good, weight stable  He does have history of atrophic  gastritis but I do not think current symptoms are secondary to gastritis. He is diabetic, on oral agents and started Jardiance about 3 to 4 months ago.  It is possible that some of the symptoms may be related to [facility] but he stopped it for about a week with no change in symptoms. I think we need to consider SIBO, consider pancreatic insufficiency, consider other acute intra-abdominal inflammatory process, pancreatic disease, gastroparesis.  #2 history of atrophic gastritis-up-to-date with EGD which has been done serially because of intestinal metaplasia associated with chronic gastritis.  Indicated for follow-up EGD fall 2026  #3 history of adenomatous colon polyps-up-to-date with colonoscopy just done November 2023-at which time he had 1 hyperplastic polyp and 1 inflammatory polyp/plan for 7-year interval follow-up  #4 pernicious anemia-recent B12 normal #5.  Hyperlipidemia #6.  Migraines #7.  Hypertension  Plan; Continue Pepcid 40 mg p.o. twice daily CBC with differential, sed rate, CRP, BMET Stool for lactoferrin and fecal  elastase Add trial of Bentyl 10 mg p.o. 3 times daily as needed for urgency, bloating and/or loose stools.  Also advised him he can get over-the-counter Imodium and use this on a as needed basis especially if he knows that he will be out for traveling etc. for prolonged periods of time. Scheduled for CT of the abdomen and pelvis with contrast. If all of the above is unremarkable and symptoms are persisting schedule for breath testing for SIBO, and consider further stool testing.  Jaikob Borgwardt Oswald Hillock PA-C 03/19/2023   Cc: Shelva Majestic, MD

## 2023-03-19 NOTE — Patient Instructions (Signed)
_______________________________________________________  If your blood pressure at your visit was 140/90 or greater, please contact your primary care physician to follow up on this.  If you are age 51 or younger, your body mass index should be between 19-25. Your Body mass index is 27.29 kg/m. If this is out of the aformentioned range listed, please consider follow up with your Primary Care Provider.  ________________________________________________________  We have sent the following medications to your pharmacy for you to pick up at your convenience:  START: Bentyl 10 mg one tablet every 6 hours as needed.  Please purchase the following medications over the counter and take as directed:  START: Imodium OTC on an as needed basis, especially if going out /drinking/etc.  CONTINUE: Pepcid 40 mg one tablet twice daily  Your provider has requested that you go to the basement level for lab work before leaving today. Press "B" on the elevator. The lab is located at the first door on the left as you exit the elevator.  You have been scheduled for a CT scan of the abdomen and pelvis at [facility], 1st floor Radiology. You are scheduled on 03-21-23 at 530pm. You should arrive at 315pm for registration and to drink contrast. Please follow the written instructions below on the day of your exam:   1) Do not eat anything after 130pm (4 hours prior to your test)   You may take any medications as prescribed with a small amount of water, if necessary. If you take any of the following medications: METFORMIN, GLUCOPHAGE, GLUCOVANCE, AVANDAMET, RIOMET, FORTAMET, ACTOPLUS MET, JANUMET, GLUMETZA or METAGLIP, you MAY be asked to HOLD this medication 48 hours AFTER the exam.   The purpose of you drinking the oral contrast is to aid in the visualization of your intestinal tract. The contrast solution may cause some diarrhea. Depending on your individual set of symptoms, you may also receive an intravenous  injection of x-ray contrast/dye. Plan on being at [facility] for 45 minutes or longer, depending on the type of exam you are having performed.   If you have any questions regarding your exam or if you need to reschedule, you may call [facility] at [phone] between the hours of 8:00 am and 5:00 pm, Monday-Friday.   The [facility] providers would like to encourage you to use [facility] to communicate with providers for non-urgent requests or questions.  Due to long hold times on the telephone, sending your provider a message by [facility] may be a faster and more efficient way to get a response.  Please allow 48 business hours for a response.  Please remember that this is for non-urgent requests.  _______________________________________________________  Due to recent changes in healthcare laws, you may see the results of your imaging and laboratory studies on MyChart before your provider has had a chance to review them.  We understand that in some cases there may be results that are confusing or concerning to you. Not all laboratory results come back in the same time frame and the provider may be waiting for multiple results in order to interpret others.  Please give [place] 48 hours in order for your provider to thoroughly review all the results before contacting the office for clarification of your results.   Thank you for entrusting me with your care and choosing [facility].  Amy Esterwood, PA-C

## 2023-03-21 ENCOUNTER — Ambulatory Visit (HOSPITAL_COMMUNITY)
Admission: RE | Admit: 2023-03-21 | Discharge: 2023-03-21 | Disposition: A | Discharge status: Home / Self Care | Payer: BC Managed Care – PPO | Source: Ambulatory Visit | Attending: Physician Assistant | Admitting: Physician Assistant

## 2023-03-21 ENCOUNTER — Encounter (HOSPITAL_COMMUNITY): Payer: Self-pay

## 2023-03-21 DIAGNOSIS — R6881 Early satiety: Secondary | ICD-10-CM | POA: Insufficient documentation

## 2023-03-21 DIAGNOSIS — R152 Fecal urgency: Secondary | ICD-10-CM | POA: Insufficient documentation

## 2023-03-21 DIAGNOSIS — R140 Abdominal distension (gaseous): Secondary | ICD-10-CM | POA: Diagnosis present

## 2023-03-21 DIAGNOSIS — R197 Diarrhea, unspecified: Secondary | ICD-10-CM | POA: Diagnosis present

## 2023-03-21 DIAGNOSIS — R194 Change in bowel habit: Secondary | ICD-10-CM | POA: Diagnosis present

## 2023-03-21 DIAGNOSIS — R1012 Left upper quadrant pain: Secondary | ICD-10-CM | POA: Diagnosis present

## 2023-03-21 DIAGNOSIS — R1011 Right upper quadrant pain: Secondary | ICD-10-CM | POA: Diagnosis present

## 2023-03-21 MED ORDER — IOHEXOL 9 MG/ML PO SOLN
1000.0000 mL | Freq: Once | ORAL | Status: AC
  Administered 2023-03-21: 1000 mL via ORAL

## 2023-03-21 MED ORDER — IOHEXOL 300 MG/ML  SOLN
100.0000 mL | Freq: Once | INTRAMUSCULAR | Status: AC | PRN
  Administered 2023-03-21: 100 mL via INTRAVENOUS

## 2023-03-26 NOTE — Progress Notes (Signed)
Addendum: Reviewed and agree with assessment and management plan. Kadijah Shamoon M, MD  

## 2023-04-02 ENCOUNTER — Other Ambulatory Visit: Payer: BC Managed Care – PPO

## 2023-04-02 DIAGNOSIS — R152 Fecal urgency: Secondary | ICD-10-CM

## 2023-04-02 DIAGNOSIS — R197 Diarrhea, unspecified: Secondary | ICD-10-CM

## 2023-04-02 DIAGNOSIS — R1012 Left upper quadrant pain: Secondary | ICD-10-CM

## 2023-04-02 DIAGNOSIS — R6881 Early satiety: Secondary | ICD-10-CM

## 2023-04-02 DIAGNOSIS — R140 Abdominal distension (gaseous): Secondary | ICD-10-CM

## 2023-04-02 DIAGNOSIS — R194 Change in bowel habit: Secondary | ICD-10-CM

## 2023-04-03 LAB — FECAL LACTOFERRIN, QUANT
Fecal Lactoferrin: POSITIVE — AB
MICRO NUMBER:: 15502412
SPECIMEN QUALITY:: ADEQUATE

## 2023-04-10 LAB — PANCREATIC ELASTASE, FECAL: Pancreatic Elastase-1, Stool: 68 ug/g — ABNORMAL LOW

## 2023-04-18 ENCOUNTER — Other Ambulatory Visit: Payer: Self-pay

## 2023-04-18 MED ORDER — PANCRELIPASE (LIP-PROT-AMYL) 36000-114000 UNITS PO CPEP: ORAL_CAPSULE | ORAL | 6 refills | Status: DC

## 2023-04-19 IMAGING — CT CT CARDIAC CORONARY ARTERY CALCIUM SCORE
3 series · 14 of 20 positions shown, 16 images · non-contrast
Comparison: None.
COMPARISON: None.

Addendum:
EXAM:
OVER-READ INTERPRETATION  CT CHEST

The following report is an over-read performed by radiologist Colling.
Thandi Jogee [REDACTED] on 09/02/2021. This
over-read does not include interpretation of cardiac or coronary
anatomy or pathology. The coronary calcium score interpretation by
the cardiologist is attached.
CLINICAL DATA: Cardiovascular Disease Risk stratification
Coronary Calcium Score
TECHNIQUE: A gated, non-contrast computed tomography scan of the heart was
performed using 3mm slice thickness. Axial images were analyzed on a
dedicated workstation. Calcium scoring of the coronary arteries was
performed using the Agatston method.

[Series 2: cascseq 2.0 sa36 (id) (id) · axial · 0.39mm/px · z∈[-236,-146]mm · 4 of 76 slices shown]
[im 16/76  vessel]
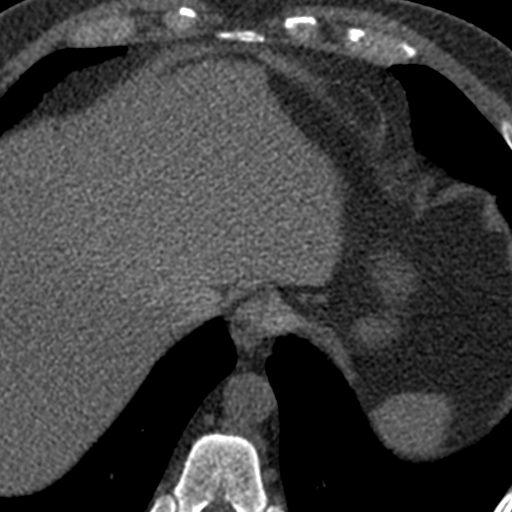
[im 31/76  vessel]
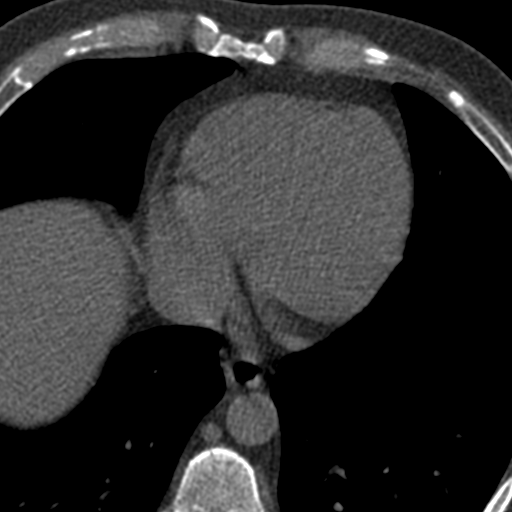
[im 46/76  vessel]
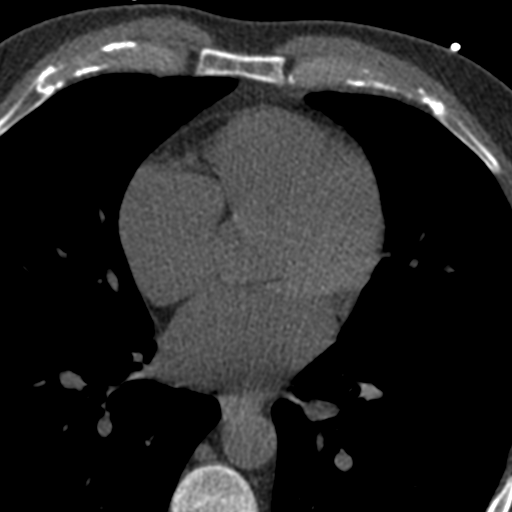
[im 61/76  vessel]
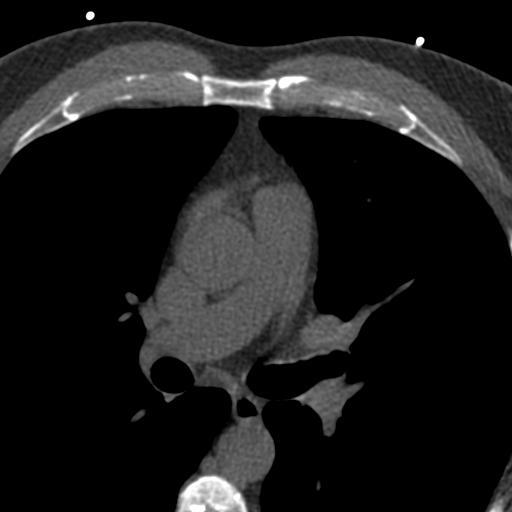

[Series 3: cascseq 2.0 bf37 st · axial · 0.66mm/px · z∈[-242,-142]mm · 5 of 76 slices shown, 7 images]
[im 13/76  vessel]
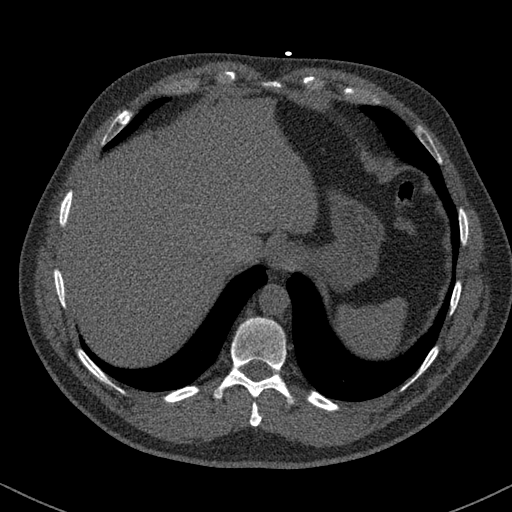
[im 13/76  lung]
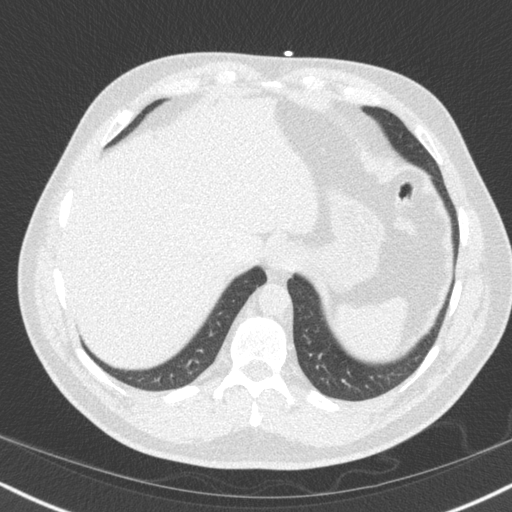
[im 26/76  vessel]
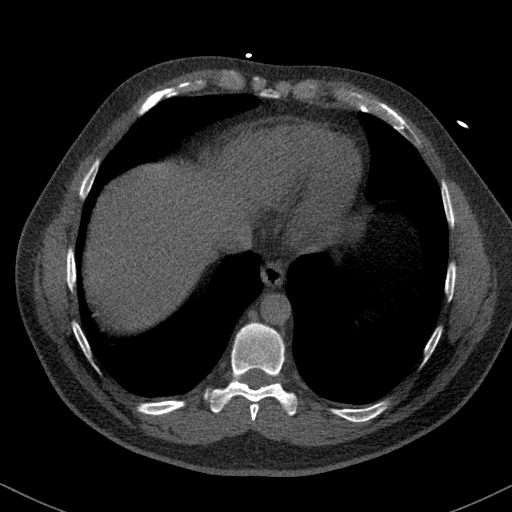
[im 38/76  vessel]
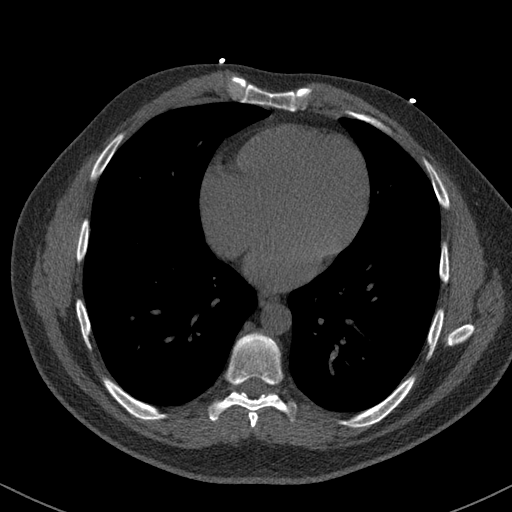
[im 51/76  vessel]
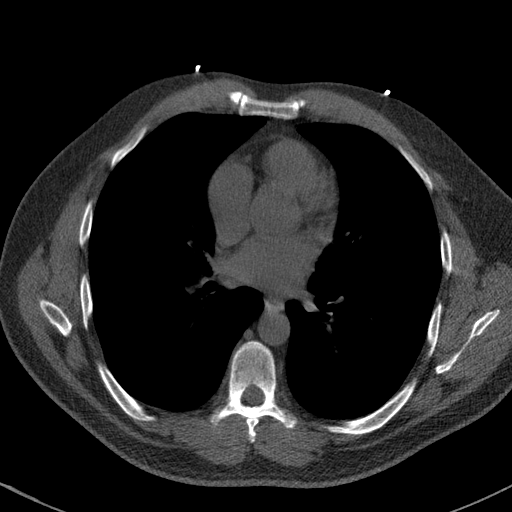
[im 63/76  vessel]
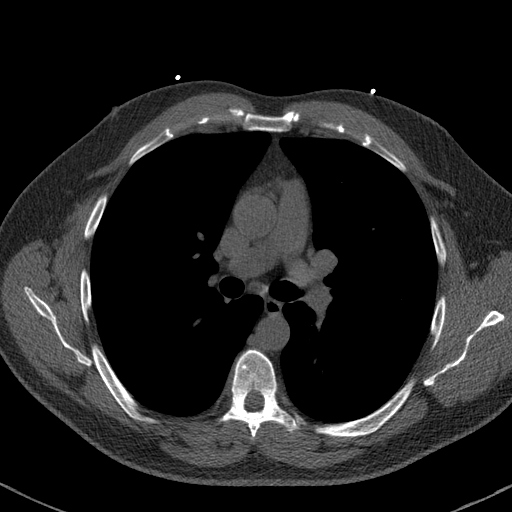
[im 63/76  lung]
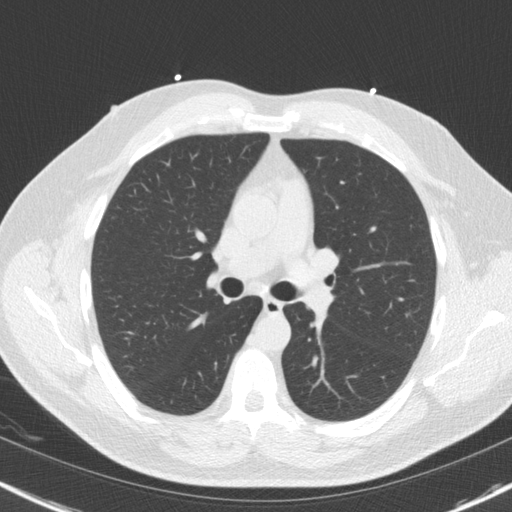

[Series 4: cascseq 2.0 br59 lung · axial · 0.66mm/px · z∈[-242,-142]mm · 5 of 76 slices shown]
[im 13/76  lung]
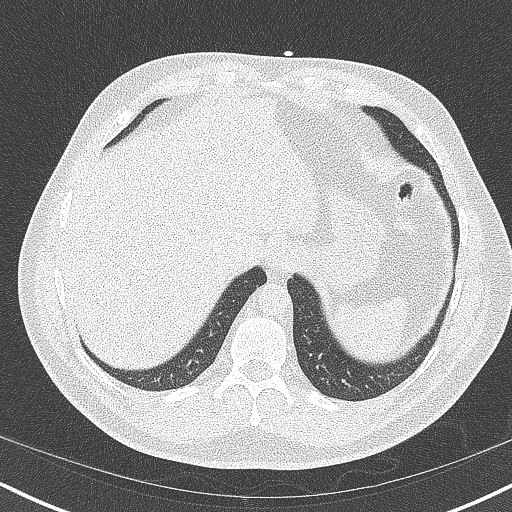
[im 26/76  lung]
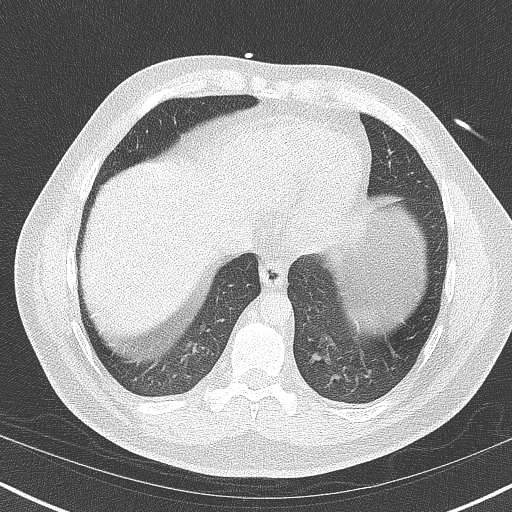
[im 38/76  lung]
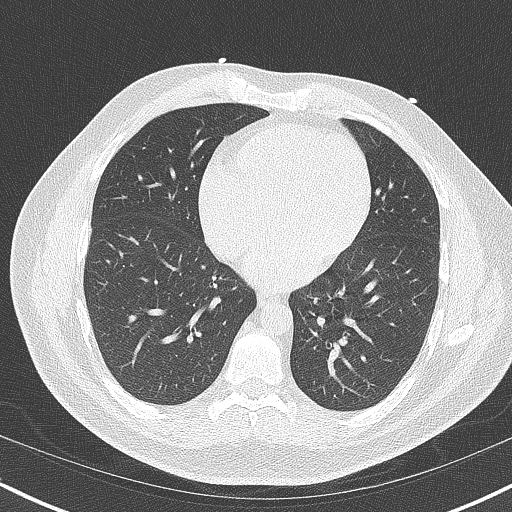
[im 51/76  lung]
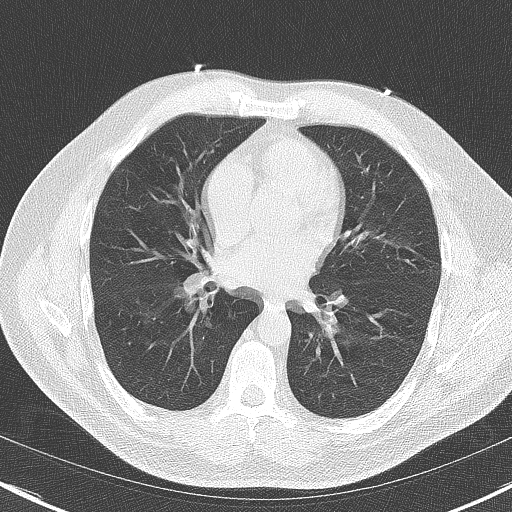
[im 63/76  lung]
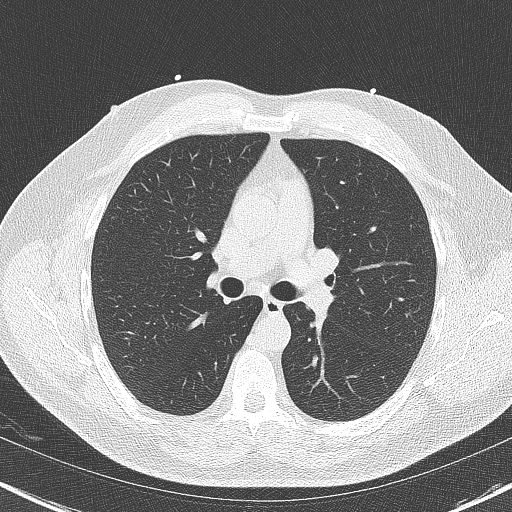

[14 of 20 positions shown; findings below may reference images not displayed]

FINDINGS: Within the visualized portions of the thorax there are no suspicious
appearing pulmonary nodules or masses, there is no acute
consolidative airspace disease, no pleural effusions, no
pneumothorax and no lymphadenopathy. Visualized portions of the
upper abdomen are unremarkable. There are no aggressive appearing
lytic or blastic lesions noted in the visualized portions of the
skeleton.
IMPRESSION: 1. No significant incidental noncardiac findings are noted.
FINDINGS: Coronary arteries: Normal origins.

Coronary Calcium Score:

Left main: 0

Left anterior descending artery:

Left circumflex artery: 0

Right coronary artery: 0

Total:

Percentile: 74th

Pericardium: Normal.

Aorta: Normal caliber.

Non-cardiac: See separate report from [REDACTED].
IMPRESSION: Coronary calcium score of 0.916. This was 74th percentile for age-,
race-, and sex-matched controls.



If CAC=0, it is reasonable to withhold statin therapy and reassess
in 5 to 10 years, as long as higher risk conditions are absent
(diabetes mellitus, family history of premature CHD in first degree
relatives (males <55 years; females <65 years), cigarette smoking,
or LDL >=190 mg/dL).

If CAC is 1 to 99, it is reasonable to initiate statin therapy for
patients >=55 years of age.

If CAC is >=100 or >=75th percentile, it is reasonable to initiate
statin therapy at any age.

Cardiology referral should be considered for patients with CAC
scores >=400 or >=75th percentile.

*7262 AHA/ACC/AACVPR/AAPA/ABC/T-FLOW/K-ROL/BHIM/Demoye/BENGTSON/ASPIAZU/AVYAR
Guideline on the Management of Blood Cholesterol: A Report of the
American College of Cardiology/American Heart Association Task Force
on Clinical Practice Guidelines. J Am Coll Cardiol.
3701;73(24):4701-4629.

*** End of Addendum ***
EXAM:
OVER-READ INTERPRETATION  CT CHEST

The following report is an over-read performed by radiologist Colling.
Thandi Jogee [REDACTED] on 09/02/2021. This
over-read does not include interpretation of cardiac or coronary
anatomy or pathology. The coronary calcium score interpretation by
the cardiologist is attached.
FINDINGS: Within the visualized portions of the thorax there are no suspicious
appearing pulmonary nodules or masses, there is no acute
consolidative airspace disease, no pleural effusions, no
pneumothorax and no lymphadenopathy. Visualized portions of the
upper abdomen are unremarkable. There are no aggressive appearing
lytic or blastic lesions noted in the visualized portions of the
skeleton.
IMPRESSION: 1. No significant incidental noncardiac findings are noted.

## 2023-04-24 ENCOUNTER — Encounter: Payer: Self-pay | Admitting: Allergy and Immunology

## 2023-04-24 ENCOUNTER — Ambulatory Visit: Payer: BC Managed Care – PPO | Admitting: Allergy and Immunology

## 2023-04-24 ENCOUNTER — Other Ambulatory Visit: Payer: Self-pay

## 2023-04-24 VITALS — BP 132/82 | HR 70 | Temp 98.7°F | Resp 18 | Ht 67.0 in | Wt 170.0 lb

## 2023-04-24 DIAGNOSIS — J4540 Moderate persistent asthma, uncomplicated: Secondary | ICD-10-CM

## 2023-04-24 NOTE — Patient Instructions (Addendum)
  1. Start Symbicort 160 - 2 inhalations 1-2 times per day (empty lungs)  2. If needed: Symbicort 160 - 2 inhalations every 6 hours  3. Obtain fall flu vaccine  4. Further treatment???  5. Return to clinic in 1 year or earlier if problem

## 2023-04-24 NOTE — Progress Notes (Unsigned)
[place] - [place] - [place] - [place] - [place]   Dear Durene Cal,  Thank you for referring Gregory Norway, Gregory Collins to the [facility] [place] on 04/24/2023.   Below is a summation of this patient's evaluation and recommendations.  Thank you for your referral. I will keep you informed about this patient's response to treatment.   If you have any questions please do not hesitate to contact me.   Sincerely,  Jessica Priest, MD Allergy / Immunology [facility]   ______________________________________________________________________    NEW PATIENT NOTE  Referring Provider: Shelva Majestic, MD Primary Provider: Shelva Majestic, MD Date of office visit: 04/24/2023    Subjective:   Chief Complaint:  Gregory Norway, Gregory Collins (DOB: Apr 16, 1971) is a 52 y.o. male who presents to the clinic on 04/24/2023 with a chief complaint of Asthma (No issues /) and Allergic Rhinitis  (No issues ) .     HPI: Gregory Collins presents to this clinic in evaluation of asthma.  We had seen him back in 2019 for similar issue.  His last visit with Dr. Nunzio Cobbs was 02 October 2017.  He was doing quite well with his asthma while utilizing a controller agent and occasionally a short acting bronchodilator.  He had his medications switched by his insurance company about 6 months ago and ever since then he has noticed that he is having problems walking.  He walks about 5 miles and at about the 2 mile mark he gets short of breath and a little bit winded and when he gets home he uses a short acting bronchodilator and it appears to result in resolution of this issue.  He does not have any associated chest pain or other respiratory tract symptoms.  If he is not walking then he really has no respiratory tract symptoms.  It should be noted that 6 months ago he was given Pulmicort and he found that Pulmicort does not work at all and he has  stopped this over the course of the past several months.  He has not had any significant environmental changes that could account for this issue and he has not started any new medications that can account for this issue.  He does not really have any significant nasal issues.  He does not have any significant reflux issues as long as he continues on famotidine every day.  Past Medical History:  Diagnosis Date   Allergy    seasonal   Asthma    B12 deficiency    Diabetes mellitus without complication (HCC)    diet controlled   Gastritis    GERD (gastroesophageal reflux disease)    Hiatal hernia    Hyperlipidemia    Hypertension    Intestinal metaplasia of gastric cardia    Intestinal metaplasia of gastric mucosa    Sinus tachycardia 06/29/2015   Tubular adenoma of colon     Past Surgical History:  Procedure Laterality Date   COLONOSCOPY     LEG SURGERY     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   UPPER GASTROINTESTINAL ENDOSCOPY      Allergies as of 04/24/2023   No Known Allergies      Medication List    albuterol 108 (90 Base) MCG/ACT inhaler Commonly known as: VENTOLIN HFA INHALE 2 PUFFS INTO THE LUNGS EVERY 4 HOURS AS NEEDED FOR WHEEZING OR SHORTNESS OF BREATH.   aspirin EC 81 MG tablet Take 1 tablet (  81 mg total) by mouth daily. Swallow whole.   atorvastatin 80 MG tablet Commonly known as: LIPITOR TAKE 1 TABLET BY MOUTH EVERY DAY   cyanocobalamin 1000 MCG/ML injection Commonly known as: VITAMIN B12 INJECT 1 ML (1,000 MCG) INTRAMUSCULARLY EVERY 30 DAYS   dicyclomine 10 MG capsule Commonly known as: BENTYL Take 1 capsule (10 mg total) by mouth every 6 (six) hours as needed for spasms (bloating, diarrhea, urgency).   ezetimibe 10 MG tablet Commonly known as: ZETIA TAKE 1 TABLET BY MOUTH EVERY DAY   famotidine 40 MG tablet Commonly known as: PEPCID TAKE 1 TABLET BY MOUTH TWICE A DAY   fluticasone 44 MCG/ACT inhaler Commonly known as: Flovent HFA INHALE 2 PUFFS INTO THE  LUNGS TWICE A DAY   Jardiance 10 MG Tabs tablet Generic drug: empagliflozin TAKE 1 TABLET BY MOUTH DAILY BEFORE BREAKFAST.   lipase/protease/amylase 40347 UNITS Cpep capsule Commonly known as: Creon 2 capsules by mouth with each meal 3 times daily and 1 capsule by mouth daily with snack   lisinopril 20 MG tablet Commonly known as: ZESTRIL TAKE 1 TABLET BY MOUTH EVERY DAY   metFORMIN 500 MG 24 hr tablet Commonly known as: GLUCOPHAGE-XR TAKE 2 TABLETS (1,000 MG TOTAL) BY MOUTH IN THE MORNING AND AT BEDTIME.   metoprolol tartrate 25 MG tablet Commonly known as: LOPRESSOR Take 1 tablet (25 mg total) by mouth 2 (two) times daily. NEED APPOINTMENT    Review of systems negative except as noted in HPI / PMHx or noted below:  Review of Systems  Constitutional: Negative.   HENT: Negative.    Eyes: Negative.   Respiratory: Negative.    Cardiovascular: Negative.   Gastrointestinal: Negative.   Genitourinary: Negative.   Musculoskeletal: Negative.   Skin: Negative.   Neurological: Negative.   Endo/Heme/Allergies: Negative.   Psychiatric/Behavioral: Negative.      Family History  Problem Relation Age of Onset   Diabetes Mother    Kidney disease Mother    Diabetes Father    Prostate cancer Father    Hyperlipidemia Father        mother   Hypertension Father        mother   Colon cancer Neg Hx    Colon polyps Neg Hx    Esophageal cancer Neg Hx    Rectal cancer Neg Hx    Stomach cancer Neg Hx    Pancreatic cancer Neg Hx    Allergic rhinitis Neg Hx    Angioedema Neg Hx    Asthma Neg Hx    Eczema Neg Hx    Immunodeficiency Neg Hx    Urticaria Neg Hx    Crohn's disease Neg Hx    Ulcerative colitis Neg Hx     Social History   Socioeconomic History   Marital status: Married    Spouse name: Not on file   Number of children: 2   Years of education: Not on file   Highest education level: Not on file  Occupational History   Occupation: professor    Employer: [employer]  [place]  Tobacco Use   Smoking status: Never    Passive exposure: Never   Smokeless tobacco: Never  Vaping Use   Vaping status: Never Used  Substance and Sexual Activity   Alcohol use: Yes    Alcohol/week: 3.0 - 4.0 standard drinks of alcohol    Types: 3 - 4 Cans of beer per week    Comment: social   Drug use: No   Sexual activity:  Yes    Birth control/protection: None  Other Topics Concern   Not on file  Social History Narrative   Married 26 years in 2023. 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    Games developer, Counselling psychologist-       Hobbies: family time,[place] golfing, basketball with son, walking at park   Environmental and Social history  Lives in a house with a dry environment, no animals located inside the household, no carpet in the bedroom, plastic on the bed, plastic on the pillow, and no smoking ongoing with inside the household.  He is a professor at [employer] in [employer] of education. Objective:   Vitals:   04/24/23 0937  BP: 132/82  Pulse: 70  Resp: 18  Temp: 98.7 F (37.1 C)  SpO2: 97%   Height: 5\' 7"  (170.2 cm) Weight: 170 lb (77.1 kg)  Physical Exam Constitutional:      Appearance: He is not diaphoretic.  HENT:     Head: Normocephalic.     Right Ear: Tympanic membrane, ear canal and external ear normal.     Left Ear: Tympanic membrane, ear canal and external ear normal.     Nose: Nose normal. No mucosal edema or rhinorrhea.     Mouth/Throat:     Pharynx: Uvula midline. No oropharyngeal exudate.  Eyes:     Conjunctiva/sclera: Conjunctivae normal.  Neck:     Thyroid: No thyromegaly.     Trachea: Trachea normal. No tracheal tenderness or tracheal deviation.  Cardiovascular:     Rate and Rhythm: Normal rate and regular rhythm.     Heart sounds: Normal heart sounds, S1 normal and S2 normal. No murmur heard. Pulmonary:     Effort: No  respiratory distress.     Breath sounds: Normal breath sounds. No stridor. No wheezing or rales.  Lymphadenopathy:     Head:     Right side of head: No tonsillar adenopathy.     Left side of head: No tonsillar adenopathy.     Cervical: No cervical adenopathy.  Skin:    Findings: No erythema or rash.     Nails: There is no clubbing.  Neurological:     Mental Status: He is alert.     Diagnostics: Allergy skin tests were not performed.   Spirometry was performed and demonstrated an FEV1 of 2.75 @ 91 % of predicted. FEV1/FVC = 0.80  Assessment and Plan:    1. Not well controlled moderate persistent asthma     Patient Instructions   1. Start Symbicort 160 - 2 inhalations 1-2 times per day (empty lungs)  2. If needed: Symbicort 160 - 2 inhalations every 6 hours  3. Obtain fall flu vaccine  4. Further treatment???  5. Return to clinic in 1 year or earlier if problem   Jessica Priest, MD Allergy / Immunology [facility]

## 2023-04-25 ENCOUNTER — Encounter: Payer: Self-pay | Admitting: Allergy and Immunology

## 2023-04-25 ENCOUNTER — Encounter: Payer: Self-pay | Admitting: Internal Medicine

## 2023-04-25 MED ORDER — BUDESONIDE-FORMOTEROL FUMARATE 160-4.5 MCG/ACT IN AERO: 2.0000 | INHALATION_SPRAY | Freq: Two times a day (BID) | RESPIRATORY_TRACT | 5 refills | Status: DC

## 2023-04-25 NOTE — Addendum Note (Signed)
Addended by: Dollene Cleveland R on: 04/25/2023 04:51 PM   Modules accepted: Orders

## 2023-05-21 ENCOUNTER — Other Ambulatory Visit: Payer: Self-pay | Admitting: Internal Medicine

## 2023-05-21 DIAGNOSIS — R197 Diarrhea, unspecified: Secondary | ICD-10-CM

## 2023-05-21 NOTE — Telephone Encounter (Signed)
I contacted the patient by phone he had questions regarding EPI diagnosis He had a low fecal elastase but also positive fecal lactoferrin Symptoms have largely improved there is no abdominal pain nor diarrhea.  Some occasional loose stools.  We did review his CT scan with contrast of the abdomen and pelvis done 03/21/2023 Pancreas looks normal He was concerned about his EPI have a cancer risk; he has no evidence for pancreatic cancer and no chronic pancreatitis.  We discussed how patients with chronic pancreatitis can have an elevated pancreatic cancer risk  Given his improvement I think we repeat the fecal elastase before he commits to Creon therapy He is in agreement I have placed an order for repeat fecal elastase and he will come to the lab this week at his convenience to obtain the collection kit If elastase remains low I do recommend he try Creon  Time provided for questions and answers and he thanked me for the call

## 2023-05-24 ENCOUNTER — Other Ambulatory Visit: Payer: BC Managed Care – PPO

## 2023-05-25 ENCOUNTER — Other Ambulatory Visit: Payer: BC Managed Care – PPO

## 2023-05-25 DIAGNOSIS — R197 Diarrhea, unspecified: Secondary | ICD-10-CM

## 2023-06-02 LAB — PANCREATIC ELASTASE, FECAL: Pancreatic Elastase-1, Stool: 78 ug/g — ABNORMAL LOW (ref >200)

## 2023-06-16 ENCOUNTER — Other Ambulatory Visit: Payer: Self-pay | Admitting: Internal Medicine

## 2023-06-28 ENCOUNTER — Ambulatory Visit: Payer: BC Managed Care – PPO | Admitting: Family Medicine

## 2023-06-28 ENCOUNTER — Encounter: Payer: Self-pay | Admitting: Family Medicine

## 2023-06-28 VITALS — BP 104/60 | HR 73 | Temp 97.8°F | Ht 67.0 in | Wt 171.8 lb

## 2023-06-28 DIAGNOSIS — D510 Vitamin B12 deficiency anemia due to intrinsic factor deficiency: Secondary | ICD-10-CM | POA: Diagnosis not present

## 2023-06-28 DIAGNOSIS — Z7984 Long term (current) use of oral hypoglycemic drugs: Secondary | ICD-10-CM | POA: Diagnosis not present

## 2023-06-28 DIAGNOSIS — E119 Type 2 diabetes mellitus without complications: Secondary | ICD-10-CM

## 2023-06-28 DIAGNOSIS — I10 Essential (primary) hypertension: Secondary | ICD-10-CM

## 2023-06-28 LAB — CBC WITH DIFFERENTIAL/PLATELET
Basophils Absolute: 0 10*3/uL (ref 0.0–0.1)
Basophils Relative: 0.5 % (ref 0.0–3.0)
Eosinophils Absolute: 0.7 10*3/uL (ref 0.0–0.7)
Eosinophils Relative: 12.6 % — ABNORMAL HIGH (ref 0.0–5.0)
HCT: 42.6 % (ref 39.0–52.0)
Hemoglobin: 13.7 g/dL (ref 13.0–17.0)
Lymphocytes Relative: 34 % (ref 12.0–46.0)
Lymphs Abs: 1.9 10*3/uL (ref 0.7–4.0)
MCHC: 32.3 g/dL (ref 30.0–36.0)
MCV: 87.2 fL (ref 78.0–100.0)
Monocytes Absolute: 0.3 10*3/uL (ref 0.1–1.0)
Monocytes Relative: 6 % (ref 3.0–12.0)
Neutro Abs: 2.7 10*3/uL (ref 1.4–7.7)
Neutrophils Relative %: 46.9 % (ref 43.0–77.0)
Platelets: 214 10*3/uL (ref 150.0–400.0)
RBC: 4.88 Mil/uL (ref 4.22–5.81)
RDW: 14.5 % (ref 11.5–15.5)
WBC: 5.7 10*3/uL (ref 4.0–10.5)

## 2023-06-28 LAB — LIPID PANEL
Cholesterol: 160 mg/dL (ref 0–200)
HDL: 43.2 mg/dL
LDL Cholesterol: 93 mg/dL (ref 0–99)
NonHDL: 116.4
Total CHOL/HDL Ratio: 4
Triglycerides: 116 mg/dL (ref 0.0–149.0)
VLDL: 23.2 mg/dL (ref 0.0–40.0)

## 2023-06-28 LAB — COMPREHENSIVE METABOLIC PANEL
ALT: 17 U/L (ref 0–53)
AST: 15 U/L (ref 0–37)
Albumin: 4.7 g/dL (ref 3.5–5.2)
Alkaline Phosphatase: 65 U/L (ref 39–117)
BUN: 14 mg/dL (ref 6–23)
CO2: 29 meq/L (ref 19–32)
Calcium: 9.3 mg/dL (ref 8.4–10.5)
Chloride: 98 meq/L (ref 96–112)
Creatinine, Ser: 0.88 mg/dL (ref 0.40–1.50)
GFR: 99.91 mL/min (ref >60.00)
Glucose, Bld: 134 mg/dL — ABNORMAL HIGH (ref 70–99)
Potassium: 4.2 meq/L (ref 3.5–5.1)
Sodium: 137 meq/L (ref 135–145)
Total Bilirubin: 0.4 mg/dL (ref 0.2–1.2)
Total Protein: 7.6 g/dL (ref 6.0–8.3)

## 2023-06-28 LAB — VITAMIN B12: Vitamin B-12: 246 pg/mL (ref 211–911)

## 2023-06-28 LAB — HEMOGLOBIN A1C: Hgb A1c MFr Bld: 7 % — ABNORMAL HIGH (ref 4.6–6.5)

## 2023-06-28 NOTE — Patient Instructions (Addendum)
Please stop by lab before you go If you have mychart- we will send your results within 3 business days of [place] receiving them.  If you do not have mychart- we will call you about results within 5 business days of [place] receiving them.  *please also note that you will see labs on mychart as soon as they post. I will later go in and write notes on them- will say "notes from Dr. Durene Cal"   Recommended follow up: Return in about 4 months (around 10/27/2023) for followup or sooner if needed.Schedule b4 you leave.

## 2023-06-28 NOTE — Progress Notes (Signed)
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 very pleasant male patient who presents for/with See problem oriented charting Chief Complaint  Patient presents with   Medical Management of Chronic Issues   Diabetes   Hypertension   Past Medical History-  Patient Active Problem List   Diagnosis Date Noted   Diabetes mellitus without complication (HCC) 11/10/2014    Priority: High   Mild persistent asthma 08/03/2015    Priority: Medium    Sinus tachycardia 06/29/2015    Priority: Medium    Pernicious anemia 03/29/2015    Priority: Medium    Hyperlipidemia associated with type 2 diabetes mellitus (HCC) 11/10/2014    Priority: Medium    Essential hypertension 11/10/2014    Priority: Medium    Gastroesophageal reflux disease 06/20/2011    Priority: Medium    Erectile dysfunction 07/28/2016    Priority: Low   Allergic rhinitis 11/16/2015    Priority: Low   Migraine 08/20/2011    Priority: Low    Medications- reviewed and updated Current Outpatient Medications  Medication Sig Dispense Refill   albuterol (VENTOLIN HFA) 108 (90 Base) MCG/ACT inhaler INHALE 2 PUFFS INTO THE LUNGS EVERY 4 HOURS AS NEEDED FOR WHEEZING OR SHORTNESS OF BREATH. 18 each 5   aspirin EC 81 MG tablet Take 1 tablet (81 mg total) by mouth daily. Swallow whole. 90 tablet 3   atorvastatin (LIPITOR) 80 MG tablet TAKE 1 TABLET BY MOUTH EVERY DAY 90 tablet 3   budesonide-formoterol (SYMBICORT) 160-4.5 MCG/ACT inhaler Inhale 2 puffs into the lungs in the morning and at bedtime. 10.2 g 5   cyanocobalamin (VITAMIN B12) 1000 MCG/ML injection INJECT 1 ML (1,000 MCG) INTRAMUSCULARLY EVERY 30 DAYS 3 mL 3   dicyclomine (BENTYL) 10 MG capsule Take 1 capsule (10 mg total) by mouth every 6 (six) hours as needed for spasms (bloating, diarrhea, urgency). 90 capsule 3   ezetimibe (ZETIA) 10 MG tablet TAKE 1 TABLET BY MOUTH EVERY DAY 90 tablet 3   famotidine (PEPCID) 40 MG  tablet TAKE 1 TABLET BY MOUTH TWICE A DAY 180 tablet 1   fluticasone (FLOVENT HFA) 44 MCG/ACT inhaler INHALE 2 PUFFS INTO THE LUNGS TWICE A DAY 10.6 each 5   JARDIANCE 10 MG TABS tablet TAKE 1 TABLET BY MOUTH DAILY BEFORE BREAKFAST. 90 tablet 3   lipase/protease/amylase (CREON) 36000 UNITS CPEP capsule 2 capsules by mouth with each meal 3 times daily and 1 capsule by mouth daily with snack 210 capsule 6   lisinopril (ZESTRIL) 20 MG tablet TAKE 1 TABLET BY MOUTH EVERY DAY 90 tablet 3   metFORMIN (GLUCOPHAGE-XR) 500 MG 24 hr tablet TAKE 2 TABLETS (1,000 MG TOTAL) BY MOUTH IN THE MORNING AND AT BEDTIME. 360 tablet 1   metoprolol tartrate (LOPRESSOR) 25 MG tablet Take 1 tablet (25 mg total) by mouth 2 (two) times daily. NEED APPOINTMENT 30 tablet 0   No current facility-administered medications for this visit.     Objective:  BP 104/60   Pulse 73   Temp 97.8 F (36.6 C)   Ht 5\' 7"  (1.702 m)   Wt 171 lb 12.8 oz (77.9 kg)   SpO2 97%   BMI 26.91 kg/m  Gen: NAD, resting comfortably CV: RRR no murmurs rubs or gallops Lungs: CTAB no crackles, wheeze, rhonchi Abdomen: soft/nontender/nondistended/normal bowel sounds. No rebound or guarding.  Ext: no edema Skin: warm, dry Neuro: grossly normal, moves all extremities  Assessment and Plan   # Abdominal fullness-at last visit he wanted to trial Jardiance to see if that made a difference with plan to restart and consider abdominal scan.  Had already had recent colonoscopy and EGD within the last year without there would be low yield as for step.  He had continued symptoms and saw GI-CT abdomen pelvis thankfully reassuring  -He ended up getting started on Creon with GI-had findings of positive lactoferrin and very minimally elevated sedimentation rate -has done well with creon and feels better overall -has follow up next month  # Diabetes S: Medication:Metformin 1000 mg twice daily ER, jardiance 10 mg (issues were not jardiance) Exercise and  diet- [place] walking 4-5 days a week, down a few lbs- has improved diet Lab Results  Component Value Date   HGBA1C 7.1 (H) 02/26/2023   HGBA1C 6.8 (H) 10/18/2022   HGBA1C 7.1 (H) 06/15/2022  A/P: hopefully improved- update a1c today. Continue current meds for now    #hypertension S: medication: Lisinopril 20Mg , Metoprolol 25Mg  twice daily BP Readings from Last 3 Encounters:  06/28/23 104/60  04/24/23 132/82  03/19/23 106/78  A/P: stable- continue current medicines   #hyperlipidemia-LDL goal under 70 S: Medication: Atorvastatin 80Mg , Zetia 10Mg  added October 2021, also takes aspirin 81 mg for primary prevention per his preference  A/P: we wonder if was not absorbing as well before creon- #s up last time- update today- don't have a lot of wiggle room for new medicines. Hopefully ipmroved - continue current medications for now   #Pernicious anemia S: Discovered by Dr. Merla Riches.  Receives injections of B12  Lab Results  Component Value Date   VITAMINB12 219 02/26/2023  A/P: hoping improved- update level    Recommended follow up: Return in about 4 months (around 10/27/2023) for followup or sooner if needed.Schedule b4 you leave. Future Appointments  Date Time Provider Department Center  07/20/2023  9:30 AM Pyrtle, Carie Caddy, MD [facility]  04/29/2024  9:00 AM Kozlow, Alvira Philips, MD [facility] None    Lab/Order associations: fasting   ICD-10-CM   1. Diabetes mellitus without complication (HCC)  E11.9 Comprehensive metabolic panel    CBC with Differential/Platelet    Hemoglobin A1c    Lipid panel    2. Essential hypertension  I10     3. Pernicious anemia  D51.0 Vitamin B12      No orders of the defined types were placed in this encounter.   Return precautions advised.  Tana Conch, MD

## 2023-07-20 ENCOUNTER — Encounter: Payer: Self-pay | Admitting: Internal Medicine

## 2023-07-20 ENCOUNTER — Ambulatory Visit: Payer: 59 | Admitting: Internal Medicine

## 2023-07-20 VITALS — BP 110/70 | HR 72 | Ht 67.0 in | Wt 175.0 lb

## 2023-07-20 DIAGNOSIS — D510 Vitamin B12 deficiency anemia due to intrinsic factor deficiency: Secondary | ICD-10-CM

## 2023-07-20 DIAGNOSIS — K8681 Exocrine pancreatic insufficiency: Secondary | ICD-10-CM | POA: Diagnosis not present

## 2023-07-20 DIAGNOSIS — E538 Deficiency of other specified B group vitamins: Secondary | ICD-10-CM | POA: Diagnosis not present

## 2023-07-20 DIAGNOSIS — K2940 Chronic atrophic gastritis without bleeding: Secondary | ICD-10-CM | POA: Diagnosis not present

## 2023-07-20 DIAGNOSIS — K219 Gastro-esophageal reflux disease without esophagitis: Secondary | ICD-10-CM

## 2023-07-20 NOTE — Progress Notes (Signed)
 Subjective:    Patient ID: Gregory Fairy Feil, PhD, male    DOB: 28-Jul-1970, 54 y.o.   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  He is here alone today.  Since his last visit his diagnosis of EPI was confirmed by second low fecal elastase and he was started on Creon .  Recent lab work confirmed the diagnosis of pancreatic insufficiency with two pancreatic elastase stool tests showing results lower than 100. The patient's B12 levels were normal at 246, and other lab results including hemoglobin, platelet count, white count, MCV, AST, ALT, albumin, total bilirubin, alkaline phosphatase, and creatinine were within normal ranges.  The patient's last upper endoscopy and colonoscopy were performed in November 2023. The endoscopy revealed diffuse atrophic mucosa, which was biopsied and found to be normal. Biopsies for topographic mapping in the cardio fundus showed chronic gastritis with focal micronodular cell hyperplasia and intestinal metaplasia, but were negative for dysplasia. The same was true for the gastric body. In the gastric antrum and incisura, nonspecific gastropathy was found, but H Pylori was negative. The colonoscopy revealed three polyps, all less than a centimeter, which were inflammatory or hyperplastic.  The patient is currently on Pepcid  twice a day and Creon  36K, taking two with meals and one with snacks. The patient reported that these medications are working well, with no issues, and bowel habits have returned to normal. The patient also expressed concerns about the pancreatic insufficiency diagnosis, asking about potential concerns and necessary lifestyle changes.  The patient also expressed interest in  scheduling a follow-up appointment in six months for further monitoring.  Reflux well-controlled with Pepcid  20 mg twice daily  He is not currently taking a multivitamin.  He continues monthly IM B12.  Review of Systems As per HPI, otherwise negative  Current Medications, Allergies, Past Medical History, Past Surgical History, Family History and Social History were reviewed in [employer] record.    Objective:   Physical Exam BP 110/70 (BP Location: Left Arm, Patient Position: Sitting, Cuff Size: Normal)   Pulse 72   Ht 5' 7 (1.702 m)   Wt 175 lb (79.4 kg)   BMI 27.41 kg/m  Gen: awake, alert, NAD Pulm: CTA b/l Abd: soft, NT/ND, +BS throughout Ext: no c/c/e Neuro: nonfocal  CT ABDOMEN AND PELVIS WITH CONTRAST   TECHNIQUE: Multidetector CT imaging of the abdomen and pelvis was performed using the standard protocol following bolus administration of intravenous contrast.   RADIATION DOSE REDUCTION: This exam was performed according to the departmental dose-optimization program which includes automated exposure control, adjustment of the mA and/or kV according to patient size and/or use of iterative reconstruction technique.   CONTRAST:  OMNIPAQUE  IOHEXOL  300 MG/ML  SOLN   COMPARISON:  Abdomen only CT on 10/12/2017   FINDINGS: Lower Chest: No acute findings.   Hepatobiliary: No suspicious hepatic masses identified. Gallbladder is unremarkable. No evidence of biliary ductal dilatation.   Pancreas:  No mass or inflammatory changes.   Spleen: Within normal limits in size and appearance.   Adrenals/Urinary Tract: No suspicious masses identified. No evidence of ureteral calculi or hydronephrosis.   Stomach/Bowel: No evidence of obstruction, inflammatory process or abnormal fluid collections. Normal appendix visualized.   Vascular/Lymphatic: No pathologically enlarged lymph nodes. No acute vascular findings.   Reproductive:  No mass or  other significant abnormality.   Other:  None.   Musculoskeletal:  No suspicious bone lesions identified.   IMPRESSION: Negative. No acute findings or other significant abnormality.     Electronically Signed   By: Norleen DELENA Kil M.D.   On: 03/30/2023 12:55     Latest Ref Rng & Units 06/28/2023    9:07 AM 03/19/2023    4:08 PM 02/26/2023    2:49 PM  CBC  WBC 4.0 - 10.5 K/uL 5.7  6.0  6.7   Hemoglobin 13.0 - 17.0 g/dL 86.2  86.0  86.2   Hematocrit 39.0 - 52.0 % 42.6  43.6  42.1   Platelets 150.0 - 400.0 K/uL 214.0  243.0  237.0    CMP     Component Value Date/Time   NA 137 06/28/2023 0907   NA 141 08/10/2021 0903   K 4.2 06/28/2023 0907   CL 98 06/28/2023 0907   CO2 29 06/28/2023 0907   GLUCOSE 134 (H) 06/28/2023 0907   BUN 14 06/28/2023 0907   BUN 9 08/10/2021 0903   CREATININE 0.88 06/28/2023 0907   CREATININE 0.89 05/06/2020 1218   CALCIUM  9.3 06/28/2023 0907   PROT 7.6 06/28/2023 0907   PROT 7.6 08/10/2021 0903   ALBUMIN 4.7 06/28/2023 0907   ALBUMIN 5.0 08/10/2021 0903   AST 15 06/28/2023 0907   ALT 17 06/28/2023 0907   ALKPHOS 65 06/28/2023 0907   BILITOT 0.4 06/28/2023 0907   BILITOT 0.4 08/10/2021 0903   GFR 99.91 06/28/2023 0907   EGFR 89 08/10/2021 0903   GFRNONAA 102 05/06/2020 1218   Lab Results  Component Value Date   VITAMINB12 246 06/28/2023   LABS Pancreatic elastase: <100 B12: 246 Hb: 13.7 PLT: 214 WBC: 5.7 MCV: 87.2 AST: 15 ALT: 17 Albumin: 4.7 Total bilirubin: 0.4 Alkaline phosphatase: 65 Creatinine: 0.88  DIAGNOSTIC EGD: Diffuse atrophic mucosa, biopsied, otherwise normal (05/2022) Colonoscopy: Three polyps, all less than a centimeter, inflammatory or hyperplastic (05/2022)  PATHOLOGY Biopsy (cardia fundus): Chronic gastritis with focal micronodular cell hyperplasia and intestinal metaplasia, negative for dysplasia (05/2022) Biopsy (gastric body): Chronic gastritis with focal micronodular cell hyperplasia and intestinal  metaplasia, negative for dysplasia (05/2022) Biopsy (gastric antrum and incisura): Nonspecific gastropathy, negative for H. 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   Atrophic Gastritis/Pernicious Anemia with Gastric Metaplasia Chronic gastritis with focal micronodular cell hyperplasia and intestinal metaplasia, negative for dysplasia. No H Pylori. -Continue monitoring with upper endoscopy and biopsies, next recommended for 2026.  Pancreatic Insufficiency Confirmed by two pancreatic elastase stool tests. No definitive symptoms now that he is taking Creon , pancreas appears normal on CT. -Continue Creon  36,000, two with meals and one with snacks. -Consider multivitamin with ADEK. -Check vitamin D  levels with next lab work. -Consider pancreatic imaging every five years.  GERD Current symptoms well-controlled -Pepcid  20 mg twice daily  B12 Deficiency Recent B12 normal at 246. -Continue B12 supplement 1000 mcg IM monthly.  Hypertension, Hyperlipidemia, and Diabetes No specific discussion or changes mentioned in this conversation.  Defer to primary care  Follow-up in 6 months for general check-in and to discuss any issues.  40 minutes total spent today including patient facing time, coordination of care, reviewing medical history/procedures/pertinent radiology studies, and documentation of the encounter.

## 2023-07-20 NOTE — Patient Instructions (Addendum)
 Stay on famotidine  twice daily and Creon  at current dose.   Also, stay on Vitamin B12.  Please purchase the following medications over the counter and take as directed: Multivitamin with Vitamin D .  When you get your labs drawn the next time, we will check a Vitamin D  level.  We will call you when our July schedule becomes available.   _______________________________________________________  If your blood pressure at your visit was 140/90 or greater, please contact your primary care physician to follow up on this.  _______________________________________________________  If you are age 52 or older, your body mass index should be between 23-30. Your Body mass index is 27.41 kg/m. If this is out of the aforementioned range listed, please consider follow up with your Primary Care Provider.  If you are age 55 or younger, your body mass index should be between 19-25. Your Body mass index is 27.41 kg/m. If this is out of the aformentioned range listed, please consider follow up with your Primary Care Provider.   ________________________________________________________  The [facility] providers would like to encourage you to use MYCHART to communicate with providers for non-urgent requests or questions.  Due to long hold times on the telephone, sending your provider a message by [facility] may be a faster and more efficient way to get a response.  Please allow 48 business hours for a response.  Please remember that this is for non-urgent requests.  _______________________________________________________

## 2023-09-04 ENCOUNTER — Other Ambulatory Visit: Payer: Self-pay | Admitting: Family Medicine

## 2023-10-16 ENCOUNTER — Other Ambulatory Visit: Payer: Self-pay | Admitting: Allergy and Immunology

## 2023-11-01 ENCOUNTER — Encounter: Payer: Self-pay | Admitting: Family Medicine

## 2023-11-01 ENCOUNTER — Ambulatory Visit: Payer: BC Managed Care – PPO | Admitting: Family Medicine

## 2023-11-01 ENCOUNTER — Other Ambulatory Visit: Payer: Self-pay | Admitting: Family Medicine

## 2023-11-01 VITALS — BP 108/60 | HR 92 | Temp 97.2°F | Ht 67.0 in | Wt 180.0 lb

## 2023-11-01 DIAGNOSIS — E1169 Type 2 diabetes mellitus with other specified complication: Secondary | ICD-10-CM | POA: Diagnosis not present

## 2023-11-01 DIAGNOSIS — Z7984 Long term (current) use of oral hypoglycemic drugs: Secondary | ICD-10-CM | POA: Diagnosis not present

## 2023-11-01 DIAGNOSIS — K8689 Other specified diseases of pancreas: Secondary | ICD-10-CM

## 2023-11-01 DIAGNOSIS — I10 Essential (primary) hypertension: Secondary | ICD-10-CM | POA: Diagnosis not present

## 2023-11-01 DIAGNOSIS — E785 Hyperlipidemia, unspecified: Secondary | ICD-10-CM | POA: Diagnosis not present

## 2023-11-01 DIAGNOSIS — E119 Type 2 diabetes mellitus without complications: Secondary | ICD-10-CM

## 2023-11-01 DIAGNOSIS — E559 Vitamin D deficiency, unspecified: Secondary | ICD-10-CM | POA: Insufficient documentation

## 2023-11-01 LAB — CBC WITH DIFFERENTIAL/PLATELET
Basophils Absolute: 0 10*3/uL (ref 0.0–0.1)
Basophils Relative: 0.6 % (ref 0.0–3.0)
Eosinophils Absolute: 0.6 10*3/uL (ref 0.0–0.7)
Eosinophils Relative: 12 % — ABNORMAL HIGH (ref 0.0–5.0)
HCT: 42.5 % (ref 39.0–52.0)
Hemoglobin: 13.9 g/dL (ref 13.0–17.0)
Lymphocytes Relative: 35.5 % (ref 12.0–46.0)
Lymphs Abs: 1.7 10*3/uL (ref 0.7–4.0)
MCHC: 32.8 g/dL (ref 30.0–36.0)
MCV: 87.5 fl (ref 78.0–100.0)
Monocytes Absolute: 0.4 10*3/uL (ref 0.1–1.0)
Monocytes Relative: 7.5 % (ref 3.0–12.0)
Neutro Abs: 2.2 10*3/uL (ref 1.4–7.7)
Neutrophils Relative %: 44.4 % (ref 43.0–77.0)
Platelets: 217 10*3/uL (ref 150.0–400.0)
RBC: 4.86 Mil/uL (ref 4.22–5.81)
RDW: 14.4 % (ref 11.5–15.5)
WBC: 4.9 10*3/uL (ref 4.0–10.5)

## 2023-11-01 LAB — COMPREHENSIVE METABOLIC PANEL WITH GFR
ALT: 21 U/L (ref 0–53)
AST: 17 U/L (ref 0–37)
Albumin: 4.5 g/dL (ref 3.5–5.2)
Alkaline Phosphatase: 60 U/L (ref 39–117)
BUN: 13 mg/dL (ref 6–23)
CO2: 28 meq/L (ref 19–32)
Calcium: 9.2 mg/dL (ref 8.4–10.5)
Chloride: 100 meq/L (ref 96–112)
Creatinine, Ser: 0.92 mg/dL (ref 0.40–1.50)
GFR: 96.41 mL/min (ref >60.00)
Glucose, Bld: 152 mg/dL — ABNORMAL HIGH (ref 70–99)
Potassium: 4.1 meq/L (ref 3.5–5.1)
Sodium: 137 meq/L (ref 135–145)
Total Bilirubin: 0.4 mg/dL (ref 0.2–1.2)
Total Protein: 7.5 g/dL (ref 6.0–8.3)

## 2023-11-01 LAB — LIPID PANEL
Cholesterol: 165 mg/dL (ref 0–200)
HDL: 44.6 mg/dL (ref >39.00)
LDL Cholesterol: 90 mg/dL (ref 0–99)
NonHDL: 120.46
Total CHOL/HDL Ratio: 4
Triglycerides: 150 mg/dL — ABNORMAL HIGH (ref 0.0–149.0)
VLDL: 30 mg/dL (ref 0.0–40.0)

## 2023-11-01 LAB — HEMOGLOBIN A1C: Hgb A1c MFr Bld: 7.2 % — ABNORMAL HIGH (ref 4.6–6.5)

## 2023-11-01 LAB — MICROALBUMIN / CREATININE URINE RATIO
Creatinine,U: 101.2 mg/dL
Microalb Creat Ratio: 7.6 mg/g (ref 0.0–30.0)
Microalb, Ur: 0.8 mg/dL (ref 0.0–1.9)

## 2023-11-01 LAB — VITAMIN D 25 HYDROXY (VIT D DEFICIENCY, FRACTURES): VITD: 20.14 ng/mL — ABNORMAL LOW (ref 30.00–100.00)

## 2023-11-01 MED ORDER — VITAMIN D (ERGOCALCIFEROL) 1.25 MG (50000 UNIT) PO CAPS: 50000.0000 [IU] | ORAL_CAPSULE | ORAL | 1 refills | Status: DC

## 2023-11-01 NOTE — Progress Notes (Signed)
 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 year old very pleasant male patient who presents for/with See problem oriented charting Chief Complaint  Patient presents with   Medical Management of Chronic Issues   Diabetes   Past Medical History-  Patient Active Problem List   Diagnosis Date Noted   Diabetes mellitus without complication (HCC) 11/10/2014    Priority: High   Mild persistent asthma 08/03/2015    Priority: Medium    Sinus tachycardia 06/29/2015    Priority: Medium    Pernicious anemia 03/29/2015    Priority: Medium    Hyperlipidemia associated with type 2 diabetes mellitus (HCC) 11/10/2014    Priority: Medium    Essential hypertension 11/10/2014    Priority: Medium    Gastroesophageal reflux disease 06/20/2011    Priority: Medium    Erectile dysfunction 07/28/2016    Priority: Low   Allergic rhinitis 11/16/2015    Priority: Low   Migraine 08/20/2011    Priority: Low    Medications- reviewed and updated Current Outpatient Medications  Medication Sig Dispense Refill   albuterol  (VENTOLIN  HFA) 108 (90 Base) MCG/ACT inhaler INHALE 2 PUFFS INTO THE LUNGS EVERY 4 HOURS AS NEEDED FOR WHEEZING OR SHORTNESS OF BREATH. 18 each 5   aspirin  EC 81 MG tablet Take 1 tablet (81 mg total) by mouth daily. Swallow whole. 90 tablet 3   atorvastatin  (LIPITOR) 80 MG tablet TAKE 1 TABLET BY MOUTH EVERY DAY 90 tablet 3   budesonide -formoterol  (SYMBICORT ) 160-4.5 MCG/ACT inhaler Inhale 2 puffs into the lungs in the morning and at bedtime. 10.2 each 0   cyanocobalamin  (VITAMIN B12) 1000 MCG/ML injection INJECT 1 ML (1,000 MCG) INTRAMUSCULARLY EVERY 30 DAYS 3 mL 3   dicyclomine  (BENTYL ) 10 MG capsule Take 1 capsule (10 mg total) by mouth every 6 (six) hours as needed for spasms (bloating, diarrhea, urgency). 90 capsule 3   ezetimibe  (ZETIA ) 10 MG tablet TAKE 1 TABLET BY MOUTH EVERY DAY 90 tablet 3   famotidine  (PEPCID ) 40 MG tablet TAKE 1  TABLET BY MOUTH TWICE A DAY 180 tablet 1   JARDIANCE  10 MG TABS tablet TAKE 1 TABLET BY MOUTH DAILY BEFORE BREAKFAST. 90 tablet 3   lipase/protease/amylase (CREON ) 36000 UNITS CPEP capsule 2 capsules by mouth with each meal 3 times daily and 1 capsule by mouth daily with snack 210 capsule 6   lisinopril  (ZESTRIL ) 20 MG tablet TAKE 1 TABLET BY MOUTH EVERY DAY 90 tablet 3   metFORMIN  (GLUCOPHAGE -XR) 500 MG 24 hr tablet TAKE 2 TABLETS (1,000 MG TOTAL) BY MOUTH IN THE MORNING AND AT BEDTIME. 360 tablet 1   metoprolol  tartrate (LOPRESSOR ) 25 MG tablet Take 1 tablet (25 mg total) by mouth 2 (two) times daily. NEED APPOINTMENT 30 tablet 0   No current facility-administered medications for this visit.     Objective:  BP 108/60   Pulse 92   Temp (!) 97.2 F (36.2 C)   Ht 5\' 7"  (1.702 m)   Wt 180 lb (81.6 kg)   SpO2 97%   BMI 28.19 kg/m  Gen: NAD, resting comfortably CV: RRR no murmurs rubs or gallops Lungs: CTAB no crackles, wheeze, rhonchi Ext: no edema Skin: warm, dry     Assessment and Plan   # Pancreatic insufficiency-follows with Dr. Bridgett Camps and on Creonbut with normal appearing pancreas on CT- was having loose stools and those have resolved- off creon  for 2 months -he declines repeat pancreatic  elastase test -Also with history of atrophic gastritis-next EGD 2026   -check vitamin D  per Dr. Marietta Shorter suggestion today  # Diabetes S: Medication:Metformin  1000 mg twice daily ER, jardiance  10 mg -Monthly vitamin B12 injections due to long-term Metformin  use and prior lows  Exercise and diet- doesn't exercise as much in cold but hoping to improve with the better weather Lab Results  Component Value Date   HGBA1C 7.0 (H) 06/28/2023   HGBA1C 7.1 (H) 02/26/2023   HGBA1C 6.8 (H) 10/18/2022  A/P: hopefully stable- update a1c today. Continue current meds for now   I discussed the microalbumin to creatinine lab error with patient that occurred with [place] labs.  Essentially the ratio was  off by a factor of 10.  In this patient's individual case  did have one adjusted value of 82 but improved 8 months ago to 10- recheck today- jardiance  and lisinopril  likely helpful    #hypertension S: medication: Lisinopril  20Mg , Metoprolol  25Mg  twice daily A/P: stable- continue current medicines   #hyperlipidemia-LDL goal under 70 S: Medication: Atorvastatin  80Mg , Zetia  10Mg  added October 2021, also takes aspirin  81 mg for primary prevention per his preference Lab Results  Component Value Date   CHOL 160 06/28/2023   HDL 43.20 06/28/2023   LDLCALC 93 06/28/2023   LDLDIRECT 117.0 02/26/2023   TRIG 116.0 06/28/2023   CHOLHDL 4 06/28/2023   A/P: lipids above goal even on max dose of atorvastatin  plus zetia - wants to work on lifestyle - update full panel today per his preference  Recommended follow up: Return in about 4 months (around 03/02/2024) for physical or sooner if needed.Schedule b4 you leave. Future Appointments  Date Time Provider Department Center  04/29/2024  9:00 AM Kozlow, Rema Care, MD [facility] None   Lab/Order associations: fasting   ICD-10-CM   1. Pancreatic insufficiency  K86.89 VITAMIN D  25 Hydroxy (Vit-D Deficiency, Fractures)    2. Diabetes mellitus without complication (HCC)  E11.9 Urine Microalbumin w/creat. ratio    Hemoglobin A1c    Comprehensive metabolic panel with GFR    3. Essential hypertension  I10     4. Hyperlipidemia associated with type 2 diabetes mellitus (HCC)  E11.69 Lipid panel   E78.5 CBC with Differential/Platelet     No orders of the defined types were placed in this encounter.  Return precautions advised.  Clarisa Crooked, MD

## 2023-11-01 NOTE — Patient Instructions (Addendum)
 Please stop by lab before you go If you have mychart- we will send your results within 3 business days of us  receiving them.  If you do not have mychart- we will call you about results within 5 business days of us  receiving them.  *please also note that you will see labs on mychart as soon as they post. I will later go in and write notes on them- will say "notes from Dr. Arlene Ben"   No changes today other than restarting exercise  Recommended follow up: Return in about 4 months (around 03/02/2024) for physical or sooner if needed.Schedule b4 you leave.

## 2023-11-15 ENCOUNTER — Other Ambulatory Visit: Payer: Self-pay | Admitting: Allergy and Immunology

## 2023-11-28 LAB — HM DIABETES EYE EXAM

## 2023-11-30 ENCOUNTER — Other Ambulatory Visit: Payer: Self-pay | Admitting: Family Medicine

## 2023-12-02 ENCOUNTER — Other Ambulatory Visit: Payer: Self-pay | Admitting: Internal Medicine

## 2024-01-29 ENCOUNTER — Ambulatory Visit: Admitting: Internal Medicine

## 2024-02-01 ENCOUNTER — Ambulatory Visit: Admitting: Family Medicine

## 2024-02-08 ENCOUNTER — Ambulatory Visit: Admitting: Family Medicine

## 2024-02-10 ENCOUNTER — Other Ambulatory Visit: Payer: Self-pay | Admitting: Family Medicine

## 2024-02-10 DIAGNOSIS — D510 Vitamin B12 deficiency anemia due to intrinsic factor deficiency: Secondary | ICD-10-CM

## 2024-02-19 ENCOUNTER — Ambulatory Visit: Admitting: Family Medicine

## 2024-03-11 ENCOUNTER — Encounter: Payer: Self-pay | Admitting: Family Medicine

## 2024-03-11 ENCOUNTER — Ambulatory Visit: Admitting: Family Medicine

## 2024-03-11 ENCOUNTER — Other Ambulatory Visit: Payer: Self-pay | Admitting: Family Medicine

## 2024-03-11 VITALS — BP 118/68 | HR 74 | Temp 97.9°F | Ht 67.0 in | Wt 178.6 lb

## 2024-03-11 DIAGNOSIS — D510 Vitamin B12 deficiency anemia due to intrinsic factor deficiency: Secondary | ICD-10-CM

## 2024-03-11 DIAGNOSIS — I10 Essential (primary) hypertension: Secondary | ICD-10-CM | POA: Diagnosis not present

## 2024-03-11 DIAGNOSIS — E559 Vitamin D deficiency, unspecified: Secondary | ICD-10-CM | POA: Diagnosis not present

## 2024-03-11 DIAGNOSIS — Z7984 Long term (current) use of oral hypoglycemic drugs: Secondary | ICD-10-CM | POA: Diagnosis not present

## 2024-03-11 DIAGNOSIS — Z125 Encounter for screening for malignant neoplasm of prostate: Secondary | ICD-10-CM

## 2024-03-11 DIAGNOSIS — E119 Type 2 diabetes mellitus without complications: Secondary | ICD-10-CM

## 2024-03-11 NOTE — Patient Instructions (Addendum)
 Please stop by lab before you go If you have mychart- we will send your results within 3 business days of us  receiving them.  If you do not have mychart- we will call you about results within 5 business days of us  receiving them.  *please also note that you will see labs on mychart as soon as they post. I will later go in and write notes on them- will say notes from Dr. Katrinka   No changes today unless labs lead us  to make changes  Great job on the walking- we discussed some options to intensify  Recommended follow up: Return for next already scheduled visit or sooner if needed.

## 2024-03-11 NOTE — Progress Notes (Signed)
 Phone [phone] In person visit   Subjective:   Gregory Fairy Feil, Gregory Collins is a 53 y.o. year old very pleasant male patient who presents for/with See problem oriented charting Chief Complaint  Patient presents with   Diabetes    Pt requesting labs;    Discussed the use of AI scribe software for clinical note transcription with the patient, who gave verbal consent to proceed.  History of Present Illness   Gregory Cruces, Gregory Collins is a 53 year old male with diabetes and hypertension who presents for follow-up of his diabetes management.  His most recent A1c was 7.2 four months ago, up from a previous 7.0. He is currently taking metformin  1000 mg twice daily extended release and Jardiance  10 mg. He receives monthly B12 injections due to long-term metformin  use and prior low levels. He is concerned about his A1c levels and potential kidney issues, noting his mother's history of kidney disease.  He has a history of pancreatic insufficiency and was off Creon  at the last visit, with normal stools. He has opted to hold off on a repeat pancreatic elastase test. He experiences stomach growling not related to eating and has stopped taking pancreatic enzymes.  He has a history of atrophic gastritis with the next EGD planned in 2026. He mentions occasional throat and stomach issues, including a lingering cough and a sensation in his throat.  He takes lisinopril  20 mg and metoprolol  25 mg twice daily for hypertension. He is concerned about his cholesterol levels, which remain above goal despite taking atorvastatin  80 mg and Zetia  10 mg. He also takes aspirin  81 mg for primary prevention.  He is actively engaged in physical activity,  walking about six days a week, aiming for 10,000 to 12,000 steps daily.  He is struggling with motivation for more intense exercise though  Past Medical History-  Patient Active Problem List   Diagnosis Date Noted   Diabetes mellitus without complication (HCC)  11/10/2014    Priority: High   Mild persistent asthma 08/03/2015    Priority: Medium    Sinus tachycardia 06/29/2015    Priority: Medium    Pernicious anemia 03/29/2015    Priority: Medium    Hyperlipidemia associated with type 2 diabetes mellitus (HCC) 11/10/2014    Priority: Medium    Essential hypertension 11/10/2014    Priority: Medium    Gastroesophageal reflux disease 06/20/2011    Priority: Medium    Erectile dysfunction 07/28/2016    Priority: Low   Allergic rhinitis 11/16/2015    Priority: Low   Migraine 08/20/2011    Priority: Low   Vitamin D  deficiency 11/01/2023    Medications- reviewed and updated Current Outpatient Medications  Medication Sig Dispense Refill   albuterol  (VENTOLIN  HFA) 108 (90 Base) MCG/ACT inhaler INHALE 2 PUFFS INTO THE LUNGS EVERY 4 HOURS AS NEEDED FOR WHEEZING OR SHORTNESS OF BREATH. 18 each 5   aspirin  EC 81 MG tablet Take 1 tablet (81 mg total) by mouth daily. Swallow whole. 90 tablet 3   atorvastatin  (LIPITOR) 80 MG tablet TAKE 1 TABLET BY MOUTH EVERY DAY 90 tablet 3   budesonide -formoterol  (SYMBICORT ) 160-4.5 MCG/ACT inhaler Inhale 2 puffs into the lungs in the morning and at bedtime. 10.2 each 5   cyanocobalamin  (VITAMIN B12) 1000 MCG/ML injection INJECT 1 ML (1,000 MCG) INTRAMUSCULARLY EVERY 30 DAYS 3 mL 3   dicyclomine  (BENTYL ) 10 MG capsule Take 1 capsule (10 mg total) by mouth every 6 (six) hours as needed for spasms (bloating, diarrhea,  urgency). 90 capsule 3   ezetimibe  (ZETIA ) 10 MG tablet TAKE 1 TABLET BY MOUTH EVERY DAY 90 tablet 3   famotidine  (PEPCID ) 40 MG tablet TAKE 1 TABLET BY MOUTH TWICE A DAY 180 tablet 1   JARDIANCE  10 MG TABS tablet TAKE 1 TABLET BY MOUTH DAILY BEFORE BREAKFAST. 90 tablet 3   lipase/protease/amylase (CREON ) 36000 UNITS CPEP capsule 2 capsules by mouth with each meal 3 times daily and 1 capsule by mouth daily with snack 210 capsule 6   lisinopril  (ZESTRIL ) 20 MG tablet TAKE 1 TABLET BY MOUTH EVERY DAY 90  tablet 3   metFORMIN  (GLUCOPHAGE -XR) 500 MG 24 hr tablet TAKE 2 TABLETS (1,000 MG TOTAL) BY MOUTH IN THE MORNING AND AT BEDTIME. 360 tablet 1   metoprolol  tartrate (LOPRESSOR ) 25 MG tablet Take 1 tablet (25 mg total) by mouth 2 (two) times daily. NEED APPOINTMENT 30 tablet 0   Vitamin D , Ergocalciferol , (DRISDOL ) 1.25 MG (50000 UNIT) CAPS capsule Take 1 capsule (50,000 Units total) by mouth every 7 (seven) days. 13 capsule 1   No current facility-administered medications for this visit.     Objective:  BP 118/68 (BP Location: Left Arm, Patient Position: Sitting, Cuff Size: Normal)   Pulse 74   Temp 97.9 F (36.6 C) (Temporal)   Ht 5' 7 (1.702 m)   Wt 178 lb 9.6 oz (81 kg)   SpO2 96%   BMI 27.97 kg/m  Gen: NAD, resting comfortably CV: RRR no murmurs rubs or gallops Lungs: CTAB no crackles, wheeze, rhonchi Abdomen: soft/nontender other than very mild tenderness in LLQ/nondistended/normal bowel sounds. No rebound or guarding.  Ext: trace edema Skin: warm, dry Neuro: grossly normal, moves all extremities    Assessment and Plan      # Type 2 diabetes mellitus -Recent A1c of 7.2, increased from 7.0 four months ago. Currently managed with metformin  1000 mg twice daily and Jardiance  10 mg. Discussed potential increase in Jardiance  to 25 mg for marginal A1c reduction and kidney protection. Discussed risks of increased Jardiance  dose, including potential for urinary tract infections and yeast infections, though studies show no significant increase in risk when compared to placebo. Consideration of Ozempic or Mounjaro if A1c is significantly elevated (e.g., 8.0 or above) for greater A1c reduction and weight loss benefits. Emphasized importance of hydration to prevent kidney injury with these medications. - Order A1c test today - Consider increasing Jardiance  to 25 mg if A1c remains elevated but only mildly so - Consider Ozempic or Mounjaro if A1c is significantly elevated - Continue  metformin  1000 mg twice daily - Continue Jardiance  10 mg  # Hypertension -Well-controlled on current regimen of lisinopril  20 mg and metoprolol  25 mg twice daily.  # Hyperlipidemia -With cholesterol above goal despite atorvastatin  80 mg and Zetia  10 mg. Discussed lifestyle modifications as primary intervention. No current plans for additional lipid-lowering medications that he would not be interested in lipid clinic or injections  # Asthma -Well managed with Symbicort  as needed. Uses two puffs twice a day when symptomatic, following smart therapy approach.  # Pancreatic exocrine insufficiency -Managed without Creon . Stools remain normal, and opted to hold off on repeat pancreatic elastase test.  Reports some increased gurgling in his stomach-wonder if could be related to this  # Atrophic gastritis-follows with Dr. Albertus and with next EGD planned in 2026.  # Vitamin D  deficiency -Previously identified with a level of 20 ng/mL. High-dose vitamin D  supplementation initiated for six months. - Order vitamin D  level  today-should at least be uptrending  # Vitamin B12 deficiency -Managed with monthly B12 injections due to long-term metformin  use and prior low levels. - Order B12 level today     Recommended follow up: Return for next already scheduled visit or sooner if needed. Future Appointments  Date Time Provider Department Center  04/21/2024  8:20 AM Katrinka Garnette KIDD, MD [facility]  04/29/2024  9:00 AM Maurilio, Camellia PARAS, MD [facility] None  05/06/2024  2:10 PM Pyrtle, Gordy HERO, MD [facility]    Lab/Order associations:   ICD-10-CM   1. Essential hypertension  I10     2. Diabetes mellitus without complication (HCC)  E11.9 Comprehensive metabolic panel with GFR    CBC with Differential/Platelet    Lipid panel    Hemoglobin A1c    3. Long term current use of oral hypoglycemic drug  Z79.84     4. Vitamin D  deficiency  E55.9 VITAMIN D  25 Hydroxy (Vit-D Deficiency,  Fractures)    5. Pernicious anemia  D51.0 Vitamin B12    6. Screening for prostate cancer  Z12.5 PSA      No orders of the defined types were placed in this encounter.   Return precautions advised.  Garnette Katrinka, MD

## 2024-03-13 ENCOUNTER — Other Ambulatory Visit (INDEPENDENT_AMBULATORY_CARE_PROVIDER_SITE_OTHER)

## 2024-03-13 ENCOUNTER — Ambulatory Visit: Payer: Self-pay | Admitting: Family Medicine

## 2024-03-13 DIAGNOSIS — D510 Vitamin B12 deficiency anemia due to intrinsic factor deficiency: Secondary | ICD-10-CM

## 2024-03-13 DIAGNOSIS — E119 Type 2 diabetes mellitus without complications: Secondary | ICD-10-CM | POA: Diagnosis not present

## 2024-03-13 DIAGNOSIS — Z125 Encounter for screening for malignant neoplasm of prostate: Secondary | ICD-10-CM

## 2024-03-13 DIAGNOSIS — E559 Vitamin D deficiency, unspecified: Secondary | ICD-10-CM | POA: Diagnosis not present

## 2024-03-13 LAB — COMPREHENSIVE METABOLIC PANEL WITH GFR
ALT: 18 U/L (ref 0–53)
AST: 14 U/L (ref 0–37)
Albumin: 4.4 g/dL (ref 3.5–5.2)
Alkaline Phosphatase: 63 U/L (ref 39–117)
BUN: 12 mg/dL (ref 6–23)
CO2: 29 meq/L (ref 19–32)
Calcium: 8.9 mg/dL (ref 8.4–10.5)
Chloride: 101 meq/L (ref 96–112)
Creatinine, Ser: 0.94 mg/dL (ref 0.40–1.50)
GFR: 93.72 mL/min (ref >60.00)
Glucose, Bld: 139 mg/dL — ABNORMAL HIGH (ref 70–99)
Potassium: 4.4 meq/L (ref 3.5–5.1)
Sodium: 140 meq/L (ref 135–145)
Total Bilirubin: 0.3 mg/dL (ref 0.2–1.2)
Total Protein: 7.1 g/dL (ref 6.0–8.3)

## 2024-03-13 LAB — CBC WITH DIFFERENTIAL/PLATELET
Basophils Absolute: 0 K/uL (ref 0.0–0.1)
Basophils Relative: 0.5 % (ref 0.0–3.0)
Eosinophils Absolute: 1.1 K/uL — ABNORMAL HIGH (ref 0.0–0.7)
Eosinophils Relative: 15.9 % — ABNORMAL HIGH (ref 0.0–5.0)
HCT: 41.8 % (ref 39.0–52.0)
Hemoglobin: 13.6 g/dL (ref 13.0–17.0)
Lymphocytes Relative: 35.2 % (ref 12.0–46.0)
Lymphs Abs: 2.4 K/uL (ref 0.7–4.0)
MCHC: 32.6 g/dL (ref 30.0–36.0)
MCV: 85.1 fl (ref 78.0–100.0)
Monocytes Absolute: 0.5 K/uL (ref 0.1–1.0)
Monocytes Relative: 6.8 % (ref 3.0–12.0)
Neutro Abs: 2.8 K/uL (ref 1.4–7.7)
Neutrophils Relative %: 41.6 % — ABNORMAL LOW (ref 43.0–77.0)
Platelets: 219 K/uL (ref 150.0–400.0)
RBC: 4.91 Mil/uL (ref 4.22–5.81)
RDW: 14.9 % (ref 11.5–15.5)
WBC: 6.8 K/uL (ref 4.0–10.5)

## 2024-03-13 LAB — PSA: PSA: 0.55 ng/mL (ref 0.10–4.00)

## 2024-03-13 LAB — VITAMIN D 25 HYDROXY (VIT D DEFICIENCY, FRACTURES): VITD: 48.52 ng/mL (ref 30.00–100.00)

## 2024-03-14 LAB — VITAMIN B12: Vitamin B-12: 298 pg/mL (ref 200–1100)

## 2024-03-14 LAB — LIPID PANEL
Cholesterol: 158 mg/dL (ref <200)
HDL: 44 mg/dL (ref >=40)
LDL Cholesterol (Calc): 89 mg/dL
Non-HDL Cholesterol (Calc): 114 mg/dL (ref <130)
Total CHOL/HDL Ratio: 3.6 (calc) (ref <5.0)
Triglycerides: 150 mg/dL — ABNORMAL HIGH (ref <150)

## 2024-03-14 LAB — HEMOGLOBIN A1C
Hgb A1c MFr Bld: 6.9 % — ABNORMAL HIGH (ref <5.7)
Mean Plasma Glucose: 151 mg/dL
eAG (mmol/L): 8.4 mmol/L

## 2024-03-25 ENCOUNTER — Other Ambulatory Visit: Payer: Self-pay | Admitting: Family Medicine

## 2024-03-25 DIAGNOSIS — R070 Pain in throat: Secondary | ICD-10-CM

## 2024-04-13 ENCOUNTER — Other Ambulatory Visit: Payer: Self-pay | Admitting: Family Medicine

## 2024-04-21 ENCOUNTER — Encounter: Payer: Self-pay | Admitting: Family Medicine

## 2024-04-21 ENCOUNTER — Ambulatory Visit (INDEPENDENT_AMBULATORY_CARE_PROVIDER_SITE_OTHER): Admitting: Family Medicine

## 2024-04-21 VITALS — BP 118/78 | HR 89 | Temp 97.5°F | Ht 67.0 in | Wt 175.6 lb

## 2024-04-21 DIAGNOSIS — Z0000 Encounter for general adult medical examination without abnormal findings: Secondary | ICD-10-CM

## 2024-04-21 DIAGNOSIS — Z23 Encounter for immunization: Secondary | ICD-10-CM

## 2024-04-21 DIAGNOSIS — D510 Vitamin B12 deficiency anemia due to intrinsic factor deficiency: Secondary | ICD-10-CM | POA: Diagnosis not present

## 2024-04-21 MED ORDER — CYANOCOBALAMIN 1000 MCG/ML IJ SOLN: 1000.0000 ug | INTRAMUSCULAR | 3 refills | Status: AC

## 2024-04-21 NOTE — Progress Notes (Signed)
 Phone: [phone]   Subjective:  Patient presents today for their annual physical. Chief complaint-noted.   See problem oriented charting- ROS- full  review of systems was completed and negative  except for topics noted under acute/chronic concerns  The following were reviewed and entered/updated in epic: Past Medical History:  Diagnosis Date   Allergy    seasonal   Asthma    B12 deficiency    Diabetes mellitus without complication (HCC)    diet controlled   Gastritis    GERD (gastroesophageal reflux disease)    Hiatal hernia    Hyperlipidemia    Hypertension    Intestinal metaplasia of gastric cardia    Intestinal metaplasia of gastric mucosa    Sinus tachycardia 06/29/2015   Tubular adenoma of colon    Patient Active Problem List   Diagnosis Date Noted   Diabetes mellitus without complication (HCC) 11/10/2014    Priority: High   Mild persistent asthma 08/03/2015    Priority: Medium    Sinus tachycardia 06/29/2015    Priority: Medium    Pernicious anemia 03/29/2015    Priority: Medium    Hyperlipidemia associated with type 2 diabetes mellitus (HCC) 11/10/2014    Priority: Medium    Essential hypertension 11/10/2014    Priority: Medium    Gastroesophageal reflux disease 06/20/2011    Priority: Medium    Erectile dysfunction 07/28/2016    Priority: Low   Allergic rhinitis 11/16/2015    Priority: Low   Migraine 08/20/2011    Priority: Low   Vitamin D  deficiency 11/01/2023   Past Surgical History:  Procedure Laterality Date   COLONOSCOPY     LEG SURGERY     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   UPPER GASTROINTESTINAL ENDOSCOPY      Family History  Problem Relation Age of Onset   Diabetes Mother    Kidney disease Mother    Diabetes Father    Prostate cancer Father    Hyperlipidemia Father        mother   Hypertension Father        mother   Colon cancer Neg Hx    Colon polyps Neg Hx    Esophageal cancer Neg Hx    Rectal cancer Neg Hx    Stomach cancer Neg Hx     Pancreatic cancer Neg Hx    Allergic rhinitis Neg Hx    Angioedema Neg Hx    Asthma Neg Hx    Eczema Neg Hx    Immunodeficiency Neg Hx    Urticaria Neg Hx    Crohn's disease Neg Hx    Ulcerative colitis Neg Hx     Medications- reviewed and updated Current Outpatient Medications  Medication Sig Dispense Refill   albuterol  (VENTOLIN  HFA) 108 (90 Base) MCG/ACT inhaler INHALE 2 PUFFS INTO THE LUNGS EVERY 4 HOURS AS NEEDED FOR WHEEZING OR SHORTNESS OF BREATH. 18 each 5   aspirin  EC 81 MG tablet Take 1 tablet (81 mg total) by mouth daily. Swallow whole. 90 tablet 3   atorvastatin  (LIPITOR) 80 MG tablet TAKE 1 TABLET BY MOUTH EVERY DAY 90 tablet 3   budesonide -formoterol  (SYMBICORT ) 160-4.5 MCG/ACT inhaler Inhale 2 puffs into the lungs in the morning and at bedtime. 10.2 each 5   dicyclomine  (BENTYL ) 10 MG capsule Take 1 capsule (10 mg total) by mouth every 6 (six) hours as needed for spasms (bloating, diarrhea, urgency). 90 capsule 3   ezetimibe  (ZETIA ) 10 MG tablet TAKE 1 TABLET BY MOUTH EVERY DAY 90  tablet 3   famotidine  (PEPCID ) 40 MG tablet TAKE 1 TABLET BY MOUTH TWICE A DAY 180 tablet 1   JARDIANCE  10 MG TABS tablet TAKE 1 TABLET BY MOUTH DAILY BEFORE BREAKFAST. 90 tablet 3   lipase/protease/amylase (CREON ) 36000 UNITS CPEP capsule 2 capsules by mouth with each meal 3 times daily and 1 capsule by mouth daily with snack 210 capsule 6   lisinopril  (ZESTRIL ) 20 MG tablet TAKE 1 TABLET BY MOUTH EVERY DAY 90 tablet 3   metFORMIN  (GLUCOPHAGE -XR) 500 MG 24 hr tablet TAKE 2 TABLETS (1,000 MG TOTAL) BY MOUTH IN THE MORNING AND AT BEDTIME. 360 tablet 1   metoprolol  tartrate (LOPRESSOR ) 25 MG tablet Take 1 tablet (25 mg total) by mouth 2 (two) times daily. NEED APPOINTMENT 30 tablet 0   cyanocobalamin  (VITAMIN B12) 1000 MCG/ML injection Inject 1 mL (1,000 mcg total) into the muscle every 21 ( twenty-one) days. 4 mL 3   No current facility-administered medications for this visit.     Allergies-reviewed and updated No Known Allergies  Social History   Social History Narrative   Married 26 years in 2023. 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    Games developer, Counselling psychologist-       Hobbies: family time,[place] golfing, basketball with son, walking at park   Objective  Objective:  BP 118/78 (BP Location: Left Arm, Patient Position: Sitting, Cuff Size: Normal)   Pulse 89   Temp (!) 97.5 F (36.4 C) (Temporal)   Ht 5' 7 (1.702 m)   Wt 175 lb 9.6 oz (79.7 kg)   SpO2 95%   BMI 27.50 kg/m  Gen: NAD, resting comfortably HEENT: Mucous membranes are moist. Oropharynx normal Neck: no thyromegaly CV: RRR no murmurs rubs or gallops Lungs: CTAB no crackles, wheeze, rhonchi Abdomen: soft/nontender/nondistended/normal bowel sounds. No rebound or guarding.  Ext: no edema Skin: warm, dry Neuro: grossly normal, moves all extremities, PERRLA Declines genitourinary concerns/exam   Assessment and Plan  53 y.o. male presenting for annual physical.  Health Maintenance counseling: 1. Anticipatory guidance: Patient counseled regarding regular dental exams -q6 months, eye exams -yearly,  avoiding smoking and second hand smoke , limiting alcohol to 2 beverages per day - glass of wine a week, no illicit drugs .   2. Risk factor reduction:  Advised patient of need for regular exercise and diet rich and fruits and vegetables to reduce risk of heart attack and stroke.  Exercise- walking mainly 5 -6 days for 1.5 hours.  Diet/weight management-largely stable- wants to get to 155. .  Wt Readings from Last 3 Encounters:  04/21/24 175 lb 9.6 oz (79.7 kg)  03/11/24 178 lb 9.6 oz (81 kg)  11/01/23 180 lb (81.6 kg)  3. Immunizations/screenings/ancillary studies- flu shot today. Declines COVID. Decline Prevnar 20.  Immunization History  Administered Date(s) Administered   Influenza,  Seasonal, Injecte, Preservative Fre 04/21/2024   Influenza,inj,Quad PF,6+ Mos 03/31/2019, 05/06/2020   PFIZER Comirnaty(Gray Top)Covid-19 Tri-Sucrose Vaccine 02/23/2021   PFIZER(Purple Top)SARS-COV-2 Vaccination 08/05/2019, 08/31/2019  4. Prostate cancer screening- low risk prior trend Lab Results  Component Value Date   PSA 0.55 03/13/2024   PSA 0.48 02/26/2023   PSA 0.39 02/13/2022   5. Colon cancer screening - 06/06/22 with 7 year repeat 6. Skin cancer screening- lower risk due to melanin content. advised regular sunscreen use. Denies worrisome, changing, or new skin  lesions.  7. Smoking associated screening (lung cancer screening, AAA screen 65-75, UA)- never smoker 8. STD screening - only active with wife  Status of chronic or acute concerns   # Pancreatic insufficiency-follows with Dr. Albertus and on Creon  (not using though)-but with normal appearing pancreas on CT -Also with history of atrophic gastritis-next EGD 2026   # Diabetes S: Medication:Metformin  1000 mg twice daily ER, jardiance  10 mg CBGs- not checking Lab Results  Component Value Date   HGBA1C 6.9 (H) 03/13/2024   HGBA1C 7.2 (H) 11/01/2023   HGBA1C 7.0 (H) 06/28/2023   A/P: well controlled continue current medications    #hypertension S: medication: Lisinopril  20Mg , Metoprolol  25Mg  twice daily BP Readings from Last 3 Encounters:  04/21/24 118/78  03/11/24 118/68  11/01/23 108/60  A/P: well controlled continue current medications   #hyperlipidemia-LDL goal under 70 S: Medication: Atorvastatin  80Mg , Zetia  10Mg  added October 2021, also takes aspirin  81 mg for primary prevention per his preference  Lab Results  Component Value Date   CHOL 158 03/13/2024   HDL 44 03/13/2024   LDLCALC 89 03/13/2024   LDLDIRECT 117.0 02/26/2023   TRIG 150 (H) 03/13/2024   CHOLHDL 3.6 03/13/2024   A/P: lipids just slightly above ideal goal 70 or less for LDL but at goal on all other measures- continue current medications   #  Asthma S: Maintenance Medication: Symbicort   As needed medication: albuterol -using this almost never per week.  A/P: well controlled continue current medications    #Pernicious anemia S: Discovered by Dr. Joylene.  Receives injections of B12 monthly Lab Results  Component Value Date   VITAMINB12 298 03/13/2024  A/P: prefer over 400- increase to every 3 weeks   #Vitamin D  deficiency S: Medication: vitamin D    - not right now Last vitamin D  Lab Results  Component Value Date   VD25OH 48.52 03/13/2024  A/P: advised to take 2000 units vitamin D  per day for maintainence   # GERD S:Medication: Famotidine  40 mg twice daily through Dr. Albertus A/P: stable- continue current medicines    Recommended follow up: Return in about 3 months (around 07/22/2024) for followup or sooner if needed.Schedule b4 you leave. Future Appointments  Date Time Provider Department Center  04/29/2024  9:00 AM Kozlow, Camellia PARAS, MD [facility] None  05/06/2024  2:10 PM Pyrtle, Gordy HERO, MD [facility]   Lab/Order associations:already had labs   ICD-10-CM   1. Preventative health care  Z00.00     2. Immunization due  Z23 Flu vaccine trivalent PF, 6mos and older(Flulaval,Afluria,Fluarix,Fluzone)    3. Pernicious anemia  D51.0 cyanocobalamin  (VITAMIN B12) 1000 MCG/ML injection      Meds ordered this encounter  Medications   cyanocobalamin  (VITAMIN B12) 1000 MCG/ML injection    Sig: Inject 1 mL (1,000 mcg total) into the muscle every 21 ( twenty-one) days.    Dispense:  4 mL    Refill:  3    Return precautions advised.  Garnette Lukes, MD

## 2024-04-21 NOTE — Patient Instructions (Addendum)
 advised to take 2000 units vitamin D  per day for maintainence   Increase B12 to every 21 days  Keep up great job with exercise  No labs today as already did labs  Recommended follow up: Return in about 3 months (around 07/22/2024) for followup or sooner if needed.Schedule b4 you leave.

## 2024-04-29 ENCOUNTER — Ambulatory Visit: Payer: BC Managed Care – PPO | Admitting: Allergy and Immunology

## 2024-05-06 ENCOUNTER — Ambulatory Visit: Admitting: Internal Medicine

## 2024-05-06 ENCOUNTER — Encounter: Payer: Self-pay | Admitting: Internal Medicine

## 2024-05-06 VITALS — BP 130/70 | HR 98 | Ht 67.0 in | Wt 179.5 lb

## 2024-05-06 DIAGNOSIS — D510 Vitamin B12 deficiency anemia due to intrinsic factor deficiency: Secondary | ICD-10-CM | POA: Diagnosis not present

## 2024-05-06 DIAGNOSIS — K2940 Chronic atrophic gastritis without bleeding: Secondary | ICD-10-CM | POA: Diagnosis not present

## 2024-05-06 DIAGNOSIS — Z860100 Personal history of colon polyps, unspecified: Secondary | ICD-10-CM | POA: Diagnosis not present

## 2024-05-06 DIAGNOSIS — E538 Deficiency of other specified B group vitamins: Secondary | ICD-10-CM

## 2024-05-06 NOTE — Progress Notes (Signed)
 Subjective:    Patient ID: Gregory Fairy Feil, PhD, male    DOB: 1971-03-12, 53 y.o.   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 Previous endoscopies in 2016, 2020, and 2023 showed no dysplasia, with the next due in 2026. He is inquiring about scheduling his next endoscopy for atrophic gastritis surveillance.  He has a history of adenomatous colonic polyps, with the last colonoscopy showing non-precancerous polyps.   He has exocrine pancreatic insufficiency and was previously prescribed Creon . He stopped Creon  a few weeks after starting it as his symptoms improved, specifically the loose stools associated with his condition. He feels well currently and has regular bowel movements.  He manages GERD with famotidine  40 mg taken twice daily and reports no issues with this regimen. He is also taking B12 supplements for B12 deficiency and pernicious anemia, with recent lab work showing a B12 level of 298, which is within goal range  Recent lab work from March 13, 2024, shows vitamin D  at 48.5, PSA normal, hemoglobin A1c at 6.9, hemoglobin at 13.6, MCV at 85.1, white count at 6.8, platelet count at 219, AST at 14, ALT at 18, alkaline phosphatase at 63, total bilirubin at 0.3, albumin at 4.4, creatinine at 0.94, and BUN at 12.   Review of Systems As per HPI, otherwise negative  Current Medications, Allergies, Past Medical History, Past Surgical History, Family History and Social History were reviewed in [employer] record.     Objective:   Physical Exam BP 130/70   Pulse 98   Ht 5' 7 (1.702 m)   Wt 179 lb 8 oz (81.4 kg)   BMI 28.11 kg/m  Gen: awake, alert, NAD HEENT: anicteric  Ext: no c/c/e Neuro:  nonfocal  DIAGNOSTIC EGD: Diffuse atrophic mucosa, biopsied, otherwise normal (05/2022) Colonoscopy: Three polyps, all less than a centimeter, inflammatory or hyperplastic (05/2022)   PATHOLOGY Biopsy (cardia fundus): Chronic gastritis with focal micronodular cell hyperplasia and intestinal metaplasia, negative for dysplasia (05/2022) Biopsy (gastric body): Chronic gastritis with focal micronodular cell hyperplasia and intestinal metaplasia, negative for dysplasia (05/2022) Biopsy (gastric antrum and incisura): Nonspecific gastropathy, negative for H. 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 Surveillance endoscopy planned for November 2026, following a three-year interval. Previous endoscopies showed no dysplasia. Pernicious anemia managed with adequate B12 supplementation. - Schedule surveillance endoscopy for November 2026. - Continue B12 supplementation.  Exocrine pancreatic insufficiency Symptoms resolved, bowel movements normal. Creon  discontinued. - Discontinue Creon . - Monitor bowel movements and symptoms.  Gastroesophageal reflux disease (GERD) GERD well-controlled with famotidine . - Continue famotidine  40 mg twice daily.  Adenomatous colonic polyps Previous polyps removed in 2023 were non-precancerous. Next colonoscopy in seven years due to non-advanced nature of adenomas more remotely. - Schedule next colonoscopy in seven years, 2030.  30  minutes total spent today including patient facing time, coordination of care, reviewing  medical history/procedures/pertinent radiology studies, and documentation of the encounter.

## 2024-05-06 NOTE — Patient Instructions (Signed)
 Discontinue Creon .   Continue famotidine  and B12 daily.   Continue dicyclomine  as needed.   _______________________________________________________  If your blood pressure at your visit was 140/90 or greater, please contact your primary care physician to follow up on this.  _______________________________________________________  If you are age 52 or older, your body mass index should be between 23-30. Your Body mass index is 28.11 kg/m. If this is out of the aforementioned range listed, please consider follow up with your Primary Care Provider.  If you are age 12 or younger, your body mass index should be between 19-25. Your Body mass index is 28.11 kg/m. If this is out of the aformentioned range listed, please consider follow up with your Primary Care Provider.   ________________________________________________________  The [facility] providers would like to encourage you to use MYCHART to communicate with providers for non-urgent requests or questions.  Due to long hold times on the telephone, sending your provider a message by [facility] may be a faster and more efficient way to get a response.  Please allow 48 business hours for a response.  Please remember that this is for non-urgent requests.  _______________________________________________________  [facility] is using a team-based approach to care.  Your team is made up of your doctor and two to three APPS. Our APPS (Nurse Practitioners and Physician Assistants) work with your physician to ensure care continuity for you. They are fully qualified to address your health concerns and develop a treatment plan. They communicate directly with your gastroenterologist to care for you. Seeing the Advanced Practice Practitioners on your physician's team can help you by facilitating care more promptly, often allowing for earlier appointments, access to diagnostic testing, procedures, and other specialty referrals.

## 2024-05-07 ENCOUNTER — Other Ambulatory Visit: Payer: Self-pay | Admitting: Family Medicine

## 2024-05-28 ENCOUNTER — Other Ambulatory Visit: Payer: Self-pay | Admitting: Internal Medicine

## 2024-06-01 ENCOUNTER — Encounter: Payer: Self-pay | Admitting: Family Medicine

## 2024-06-02 ENCOUNTER — Other Ambulatory Visit: Payer: Self-pay

## 2024-06-02 MED ORDER — METOPROLOL TARTRATE 25 MG PO TABS: 25.0000 mg | ORAL_TABLET | Freq: Two times a day (BID) | ORAL | 0 refills | Status: DC

## 2024-06-03 ENCOUNTER — Ambulatory Visit: Admitting: Allergy and Immunology

## 2024-06-12 ENCOUNTER — Other Ambulatory Visit: Payer: Self-pay | Admitting: Family Medicine

## 2024-07-23 ENCOUNTER — Ambulatory Visit: Admitting: Family Medicine

## 2024-07-29 ENCOUNTER — Encounter: Payer: Self-pay | Admitting: Family Medicine

## 2024-07-29 ENCOUNTER — Ambulatory Visit: Admitting: Family Medicine

## 2024-07-29 ENCOUNTER — Ambulatory Visit: Payer: Self-pay | Admitting: Family Medicine

## 2024-07-29 VITALS — BP 112/68 | HR 92 | Temp 98.1°F | Ht 67.0 in | Wt 176.2 lb

## 2024-07-29 DIAGNOSIS — E785 Hyperlipidemia, unspecified: Secondary | ICD-10-CM

## 2024-07-29 DIAGNOSIS — J4530 Mild persistent asthma, uncomplicated: Secondary | ICD-10-CM

## 2024-07-29 DIAGNOSIS — E1169 Type 2 diabetes mellitus with other specified complication: Secondary | ICD-10-CM

## 2024-07-29 DIAGNOSIS — D510 Vitamin B12 deficiency anemia due to intrinsic factor deficiency: Secondary | ICD-10-CM | POA: Diagnosis not present

## 2024-07-29 DIAGNOSIS — Z7984 Long term (current) use of oral hypoglycemic drugs: Secondary | ICD-10-CM

## 2024-07-29 DIAGNOSIS — I10 Essential (primary) hypertension: Secondary | ICD-10-CM | POA: Diagnosis not present

## 2024-07-29 LAB — COMPREHENSIVE METABOLIC PANEL WITH GFR
ALT: 18 U/L (ref 3–53)
AST: 15 U/L (ref 5–37)
Albumin: 4.7 g/dL (ref 3.5–5.2)
Alkaline Phosphatase: 64 U/L (ref 39–117)
BUN: 11 mg/dL (ref 6–23)
CO2: 30 meq/L (ref 19–32)
Calcium: 9.1 mg/dL (ref 8.4–10.5)
Chloride: 101 meq/L (ref 96–112)
Creatinine, Ser: 0.92 mg/dL (ref 0.40–1.50)
GFR: 95.91 mL/min
Glucose, Bld: 117 mg/dL — ABNORMAL HIGH (ref 70–99)
Potassium: 4.1 meq/L (ref 3.5–5.1)
Sodium: 138 meq/L (ref 135–145)
Total Bilirubin: 0.4 mg/dL (ref 0.2–1.2)
Total Protein: 7.7 g/dL (ref 6.0–8.3)

## 2024-07-29 LAB — CBC WITH DIFFERENTIAL/PLATELET
Basophils Absolute: 0 K/uL (ref 0.0–0.1)
Basophils Relative: 0.8 % (ref 0.0–3.0)
Eosinophils Absolute: 0.7 K/uL (ref 0.0–0.7)
Eosinophils Relative: 13.4 % — ABNORMAL HIGH (ref 0.0–5.0)
HCT: 40.8 % (ref 39.0–52.0)
Hemoglobin: 13.6 g/dL (ref 13.0–17.0)
Lymphocytes Relative: 32.1 % (ref 12.0–46.0)
Lymphs Abs: 1.8 K/uL (ref 0.7–4.0)
MCHC: 33.4 g/dL (ref 30.0–36.0)
MCV: 84.4 fl (ref 78.0–100.0)
Monocytes Absolute: 0.5 K/uL (ref 0.1–1.0)
Monocytes Relative: 8.8 % (ref 3.0–12.0)
Neutro Abs: 2.5 K/uL (ref 1.4–7.7)
Neutrophils Relative %: 44.9 % (ref 43.0–77.0)
Platelets: 196 K/uL (ref 150.0–400.0)
RBC: 4.84 Mil/uL (ref 4.22–5.81)
RDW: 14.9 % (ref 11.5–15.5)
WBC: 5.5 K/uL (ref 4.0–10.5)

## 2024-07-29 LAB — VITAMIN B12: Vitamin B-12: 160 pg/mL — ABNORMAL LOW (ref 211–911)

## 2024-07-29 LAB — HEMOGLOBIN A1C: Hgb A1c MFr Bld: 7 % — ABNORMAL HIGH (ref 4.6–6.5)

## 2024-07-29 LAB — MICROALBUMIN / CREATININE URINE RATIO
Creatinine,U: 104.8 mg/dL
Microalb Creat Ratio: UNDETERMINED mg/g (ref 0.0–30.0)
Microalb, Ur: <0.7 mg/dL

## 2024-07-29 NOTE — Progress Notes (Signed)
 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 year old very pleasant male patient who presents for/with See problem oriented charting Chief Complaint  Patient presents with   Medical Management of Chronic Issues    3 month follow up; pt is fasting;    Diabetes    Past Medical History-  Patient Active Problem List   Diagnosis Date Noted   Diabetes mellitus without complication (HCC) 11/10/2014    Priority: High   Mild persistent asthma 08/03/2015    Priority: Medium    Sinus tachycardia 06/29/2015    Priority: Medium    Pernicious anemia 03/29/2015    Priority: Medium    Hyperlipidemia associated with type 2 diabetes mellitus (HCC) 11/10/2014    Priority: Medium    Essential hypertension 11/10/2014    Priority: Medium    Gastroesophageal reflux disease 06/20/2011    Priority: Medium    Erectile dysfunction 07/28/2016    Priority: Low   Allergic rhinitis 11/16/2015    Priority: Low   Migraine 08/20/2011    Priority: Low   Vitamin D  deficiency 11/01/2023    Medications- reviewed and updated Current Outpatient Medications  Medication Sig Dispense Refill   albuterol  (VENTOLIN  HFA) 108 (90 Base) MCG/ACT inhaler INHALE 2 PUFFS INTO THE LUNGS EVERY 4 HOURS AS NEEDED FOR WHEEZING OR SHORTNESS OF BREATH. 18 each 5   aspirin  EC 81 MG tablet Take 1 tablet (81 mg total) by mouth daily. Swallow whole. 90 tablet 3   atorvastatin  (LIPITOR) 80 MG tablet TAKE 1 TABLET BY MOUTH EVERY DAY 90 tablet 3   budesonide -formoterol  (SYMBICORT ) 160-4.5 MCG/ACT inhaler Inhale 2 puffs into the lungs in the morning and at bedtime. 10.2 each 5   cyanocobalamin  (VITAMIN B12) 1000 MCG/ML injection Inject 1 mL (1,000 mcg total) into the muscle every 21 ( twenty-one) days. 4 mL 3   dicyclomine  (BENTYL ) 10 MG capsule Take 1 capsule (10 mg total) by mouth every 6 (six) hours as needed for spasms (bloating, diarrhea, urgency). 90 capsule 3   ezetimibe  (ZETIA ) 10  MG tablet TAKE 1 TABLET BY MOUTH EVERY DAY 90 tablet 3   famotidine  (PEPCID ) 40 MG tablet TAKE 1 TABLET BY MOUTH TWICE A DAY 180 tablet 1   JARDIANCE  10 MG TABS tablet TAKE 1 TABLET BY MOUTH DAILY BEFORE BREAKFAST. 90 tablet 3   lisinopril  (ZESTRIL ) 20 MG tablet TAKE 1 TABLET BY MOUTH EVERY DAY 90 tablet 3   metFORMIN  (GLUCOPHAGE -XR) 500 MG 24 hr tablet TAKE 2 TABLETS BY MOUTH TWICE A DAY (MORNING & BEDTIME) 360 tablet 1   metoprolol  tartrate (LOPRESSOR ) 25 MG tablet TAKE 1 TABLET BY MOUTH 2 TIMES DAILY. NEED APPOINTMENT 180 tablet 3   No current facility-administered medications for this visit.     Objective:  BP 112/68 (BP Location: Left Arm, Patient Position: Sitting, Cuff Size: Normal)   Pulse 92   Temp 98.1 F (36.7 C) (Temporal)   Ht 5' 7 (1.702 m)   Wt 176 lb 3.2 oz (79.9 kg)   SpO2 95%   BMI 27.60 kg/m  Gen: NAD, resting comfortably CV: RRR no murmurs rubs or gallops Lungs: CTAB no crackles, wheeze, rhonchi Ext: no edema Skin: warm, dry    Assessment and Plan   #crack in bottom of skin left foot near heel- noted 2 weeks. Has tried neosporin sparingly. No deep cut/laceration thankfully and he's monitoring daily- if worsens we want him back as soon as  possible but he's going to try Vaseline or neosporin twice daily and can pare down the hardened portion (do not go deep) and we can refer of the podiatry if not improving to be extra cautious with diabetes   # Diabetes S: Medication:Metformin  1000 mg twice daily ER, jardiance  10 mg -Monthly vitamin B12 injections due to long-term Metformin  use and prior lows  Exercise and diet- little looser on diet in winter. Trying to get exercise in but cold Lab Results  Component Value Date   HGBA1C 6.9 (H) 03/13/2024   HGBA1C 7.2 (H) 11/01/2023   HGBA1C 7.0 (H) 06/28/2023   A/P: diabetes well controlled in past- update a1c today- suspect well controlled though could be slightly up with winter months- could go to jardiance  25 mg if  needed but hoping to hold steady or improve lifestyle  #B12 deficiency- maintain injections and check levels- just once a month Lab Results  Component Value Date   VITAMINB12 298 03/13/2024    #hypertension S: medication: Lisinopril  20Mg , Metoprolol  25Mg  twice daily  BP Readings from Last 3 Encounters:  07/29/24 112/68  05/06/24 130/70  04/21/24 118/78  A/P: very well controlled continue current medications   #hyperlipidemia-LDL goal under 70 S: Medication: Atorvastatin  80Mg , Zetia  10Mg  added October 2021, also takes aspirin  81 mg for primary prevention per his preference as needed Lab Results  Component Value Date   CHOL 158 03/13/2024   HDL 44 03/13/2024   LDLCALC 89 03/13/2024   LDLDIRECT 117.0 02/26/2023   TRIG 150 (H) 03/13/2024   CHOLHDL 3.6 03/13/2024   A/P: cholesterol improved but still not at ideal goal 70 or less for LDL- on max dose though so would not add additional meds  # Asthma- sees allergist S: Maintenance Medication: Symbicort   A/P: doing ok even with the cold- continue current medications    # GERD S:Medication: Famotidine  40 mg twice daily through Dr. Albertus A/P: reasonable control continue current medications    Recommended follow up: Return in about 4 months (around 11/26/2024) for followup or sooner if needed.Schedule b4 you leave. Future Appointments  Date Time Provider Department Center  04/23/2025  8:20 AM Katrinka Garnette KIDD, MD [facility]    Lab/Order associations: fasting   ICD-10-CM   1. Hyperlipidemia associated with type 2 diabetes mellitus (HCC)  E11.69 Comp Met (CMET)   E78.5 CBC w/Diff    Hemoglobin A1c    Urine Microalbumin w/creat. ratio    Lipid panel    2. Essential hypertension  I10     3. Pernicious anemia  D51.0 Vitamin B12      No orders of the defined types were placed in this encounter.   Return precautions advised.  Garnette Katrinka, MD  "

## 2024-07-29 NOTE — Patient Instructions (Addendum)
#  crack in bottom of skin left foot near heel- noted 2 weeks. Has tried neosporin sparingly. No deep cut/laceration thankfully and he's monitoring daily- if worsens we want him back as soon as possible but he's going to try Vaseline or neosporin twice daily and can pare down the hardened portion (do not go deep) and we can refer of the podiatry if not improving to be extra cautious with diabetes   Please stop by lab before you go If you have mychart- we will send your results within 3 business days of us  receiving them.  If you do not have mychart- we will call you about results within 5 business days of us  receiving them.  *please also note that you will see labs on mychart as soon as they post. I will later go in and write notes on them- will say notes from Dr. Katrinka   Recommended follow up: Return in about 4 months (around 11/26/2024) for followup or sooner if needed.Schedule b4 you leave.

## 2024-07-30 LAB — LIPID PANEL
Cholesterol: 151 mg/dL
HDL: 40 mg/dL
LDL Cholesterol (Calc): 85 mg/dL
Non-HDL Cholesterol (Calc): 111 mg/dL
Total CHOL/HDL Ratio: 3.8 (calc)
Triglycerides: 159 mg/dL — ABNORMAL HIGH

## 2024-11-24 ENCOUNTER — Ambulatory Visit: Admitting: Family Medicine

## 2025-04-23 ENCOUNTER — Encounter: Admitting: Family Medicine
